# Patient Record
Sex: Female | Born: 1940
Health system: Southern US, Community
[De-identification: ages and names within clinical notes are randomized; demographics above are authoritative.]

## PROBLEM LIST (undated history)

## (undated) ENCOUNTER — Ambulatory Visit (HOSPITAL_COMMUNITY): Discharge status: Absent without leave | Payer: Self-pay

## (undated) DIAGNOSIS — M1990 Unspecified osteoarthritis, unspecified site: Secondary | ICD-10-CM

## (undated) DIAGNOSIS — R0600 Dyspnea, unspecified: Secondary | ICD-10-CM

## (undated) DIAGNOSIS — E7800 Pure hypercholesterolemia, unspecified: Secondary | ICD-10-CM

## (undated) DIAGNOSIS — C8590 Non-Hodgkin lymphoma, unspecified, unspecified site: Secondary | ICD-10-CM

## (undated) DIAGNOSIS — Z923 Personal history of irradiation: Secondary | ICD-10-CM

## (undated) DIAGNOSIS — M2550 Pain in unspecified joint: Secondary | ICD-10-CM

## (undated) DIAGNOSIS — R221 Localized swelling, mass and lump, neck: Secondary | ICD-10-CM

## (undated) DIAGNOSIS — D649 Anemia, unspecified: Secondary | ICD-10-CM

## (undated) DIAGNOSIS — G4700 Insomnia, unspecified: Secondary | ICD-10-CM

## (undated) HISTORY — DX: Pain in unspecified joint: M25.50

## (undated) HISTORY — DX: Insomnia, unspecified: G47.00

## (undated) HISTORY — PX: ABDOMINAL HYSTERECTOMY: SHX81

## (undated) HISTORY — DX: Pure hypercholesterolemia, unspecified: E78.00

---

## 1998-05-11 ENCOUNTER — Encounter: Payer: Self-pay | Admitting: Emergency Medicine

## 1998-05-11 ENCOUNTER — Emergency Department (HOSPITAL_COMMUNITY): Admission: EM | Admit: 1998-05-11 | Discharge: 1998-05-11 | Payer: Self-pay | Admitting: Emergency Medicine

## 2000-11-24 ENCOUNTER — Encounter: Payer: Self-pay | Admitting: Internal Medicine

## 2000-11-24 ENCOUNTER — Encounter: Admission: RE | Admit: 2000-11-24 | Discharge: 2000-11-24 | Payer: Self-pay | Admitting: Internal Medicine

## 2000-12-08 ENCOUNTER — Other Ambulatory Visit: Admission: RE | Admit: 2000-12-08 | Discharge: 2000-12-08 | Payer: Self-pay | Admitting: Gynecology

## 2001-04-29 ENCOUNTER — Encounter: Payer: Self-pay | Admitting: Internal Medicine

## 2001-04-29 ENCOUNTER — Emergency Department (HOSPITAL_COMMUNITY): Admission: EM | Admit: 2001-04-29 | Discharge: 2001-04-29 | Payer: Self-pay | Admitting: Emergency Medicine

## 2003-04-23 ENCOUNTER — Encounter: Admission: RE | Admit: 2003-04-23 | Discharge: 2003-07-22 | Payer: Self-pay | Admitting: Family Medicine

## 2003-10-31 ENCOUNTER — Other Ambulatory Visit: Admission: RE | Admit: 2003-10-31 | Discharge: 2003-10-31 | Payer: Self-pay | Admitting: Gynecology

## 2004-07-05 ENCOUNTER — Emergency Department (HOSPITAL_COMMUNITY): Admission: EM | Admit: 2004-07-05 | Discharge: 2004-07-05 | Payer: Self-pay | Admitting: Family Medicine

## 2004-11-05 ENCOUNTER — Encounter: Admission: RE | Admit: 2004-11-05 | Discharge: 2004-11-05 | Payer: Self-pay | Admitting: Family Medicine

## 2006-04-11 ENCOUNTER — Encounter: Admission: RE | Admit: 2006-04-11 | Discharge: 2006-04-11 | Payer: Self-pay | Admitting: Family Medicine

## 2006-10-06 ENCOUNTER — Emergency Department (HOSPITAL_COMMUNITY): Admission: EM | Admit: 2006-10-06 | Discharge: 2006-10-06 | Payer: Self-pay | Admitting: Family Medicine

## 2010-08-19 ENCOUNTER — Encounter
Admission: RE | Admit: 2010-08-19 | Discharge: 2010-08-19 | Payer: Self-pay | Source: Home / Self Care | Attending: Family Medicine | Admitting: Family Medicine

## 2011-03-17 ENCOUNTER — Other Ambulatory Visit (HOSPITAL_COMMUNITY): Payer: Self-pay | Admitting: Family Medicine

## 2011-03-17 DIAGNOSIS — Z1231 Encounter for screening mammogram for malignant neoplasm of breast: Secondary | ICD-10-CM

## 2011-03-23 ENCOUNTER — Ambulatory Visit (HOSPITAL_COMMUNITY)
Admission: RE | Admit: 2011-03-23 | Discharge: 2011-03-23 | Disposition: A | Discharge status: Home / Self Care | Payer: BC Managed Care – PPO | Source: Ambulatory Visit | Attending: Family Medicine | Admitting: Family Medicine

## 2011-03-23 DIAGNOSIS — Z1231 Encounter for screening mammogram for malignant neoplasm of breast: Secondary | ICD-10-CM | POA: Insufficient documentation

## 2012-03-17 ENCOUNTER — Other Ambulatory Visit (HOSPITAL_COMMUNITY): Payer: Self-pay | Admitting: Family Medicine

## 2012-03-17 DIAGNOSIS — Z1231 Encounter for screening mammogram for malignant neoplasm of breast: Secondary | ICD-10-CM

## 2012-04-05 ENCOUNTER — Ambulatory Visit (HOSPITAL_COMMUNITY)
Admission: RE | Admit: 2012-04-05 | Discharge: 2012-04-05 | Disposition: A | Discharge status: Home / Self Care | Payer: Medicare Other | Source: Ambulatory Visit | Attending: Family Medicine | Admitting: Family Medicine

## 2012-04-05 DIAGNOSIS — Z1231 Encounter for screening mammogram for malignant neoplasm of breast: Secondary | ICD-10-CM | POA: Insufficient documentation

## 2013-03-20 ENCOUNTER — Other Ambulatory Visit (HOSPITAL_COMMUNITY): Payer: Self-pay | Admitting: Family Medicine

## 2013-03-20 DIAGNOSIS — Z1231 Encounter for screening mammogram for malignant neoplasm of breast: Secondary | ICD-10-CM

## 2013-04-06 ENCOUNTER — Ambulatory Visit (HOSPITAL_COMMUNITY)
Admission: RE | Admit: 2013-04-06 | Discharge: 2013-04-06 | Disposition: A | Discharge status: Home / Self Care | Payer: Medicare Other | Source: Ambulatory Visit | Attending: Family Medicine | Admitting: Family Medicine

## 2013-04-06 DIAGNOSIS — Z1231 Encounter for screening mammogram for malignant neoplasm of breast: Secondary | ICD-10-CM | POA: Insufficient documentation

## 2013-07-26 ENCOUNTER — Emergency Department (INDEPENDENT_AMBULATORY_CARE_PROVIDER_SITE_OTHER): Payer: Medicare Other

## 2013-07-26 ENCOUNTER — Encounter (HOSPITAL_COMMUNITY): Payer: Self-pay | Admitting: Emergency Medicine

## 2013-07-26 ENCOUNTER — Emergency Department (HOSPITAL_COMMUNITY)
Admission: EM | Admit: 2013-07-26 | Discharge: 2013-07-26 | Disposition: A | Discharge status: Home / Self Care | Payer: Medicare Other | Source: Home / Self Care

## 2013-07-26 DIAGNOSIS — M25562 Pain in left knee: Secondary | ICD-10-CM

## 2013-07-26 DIAGNOSIS — M25569 Pain in unspecified knee: Secondary | ICD-10-CM

## 2013-07-26 MED ORDER — TRAMADOL HCL 50 MG PO TABS: 50.0000 mg | ORAL_TABLET | Freq: Four times a day (QID) | ORAL | Status: DC | PRN

## 2013-07-26 MED ORDER — HYDROCODONE-ACETAMINOPHEN 5-325 MG PO TABS: 1.0000 | ORAL_TABLET | Freq: Four times a day (QID) | ORAL | Status: DC | PRN

## 2013-07-26 NOTE — ED Provider Notes (Signed)
Sandra Payne is a 72 y.o. female who presents to Urgent Care today for knee pain. This occurred yesterday. Patient fell down 3 or 4 steps. She thinks she may have hit the medial portion of her knee on another stare. She has moderate to severe pain. The pain is worse with ambulation. She has tried Celebrex which has helped a bit. She denies any radiating pain weakness or numbness. She did not hit her head and does not have any headaches weakness or confusion.  Past medical history significant for osteoarthritis. She takes Celebrex daily for this.   History reviewed. No pertinent past medical history. History  Substance Use Topics  . Smoking status: Never Smoker   . Smokeless tobacco: Not on file  . Alcohol Use: No   ROS as above: No fevers or chills nausea vomiting diarrhea or chest pain Medications reviewed. No current facility-administered medications for this encounter.   Current Outpatient Prescriptions  Medication Sig Dispense Refill  . Celecoxib (CELEBREX PO) Take by mouth.      [name] Kitchen HYDROcodone-acetaminophen (NORCO/VICODIN) 5-325 MG per tablet Take 1 tablet by mouth every 6 (six) hours as needed.  10 tablet  0    Exam:  BP 124/61  Pulse 81  Temp(Src) 98.1 F (36.7 C) (Oral)  Resp 16  SpO2 99% Gen: Well NAD LEFT KNEE: Normal-appearing no effusions. To palpation medial femoral condyle. Range of motion 0-120 No significant retropatellar crepitations.  Capillary refill and sensation are intact distally. Antalgic gait  No results found for this or any previous visit (from the past 24 hour(s)). Dg Knee Complete 4 Views Left  07/26/2013   CLINICAL DATA:  Medial knee pain, fell last night  EXAM: LEFT KNEE - COMPLETE 4+ VIEW  COMPARISON:  None.  FINDINGS: Moderate narrowing of the medial compartment with mild osteophyte formation. Mild narrowing and osteophyte formation of the lateral compartment. No fracture or dislocation. No significant joint effusion. Mild narrowing and  osteophyte formation of the patellofemoral compartment.  IMPRESSION: Mild to moderate arthritis with no acute findings   Electronically Signed   By: Esperanza Heir M.D.   On: 07/26/2013 10:07    Assessment and Plan: 72 y.o. female with contusion versus medial meniscus injury. Plan to treat with continuation of her Celebrex, ice, relative rest. Additional prescribe Norco for more severe pain control. She should followup with her primary care sports medicine doctor at [facility] orthopedics.     Rodolph Bong, MD 07/26/13 [phone]

## 2013-07-26 NOTE — ED Notes (Signed)
Pt  Reports  She  Felled  Down      Steps  Last  Pm         She  Reports  Twisted her  l   Knee    Pt  Has  Pain  l  Knee      She  Is  Able  To  Ambulate  But has  Pain on  ROM

## 2013-11-02 ENCOUNTER — Other Ambulatory Visit: Payer: Self-pay | Admitting: Family Medicine

## 2013-11-02 ENCOUNTER — Ambulatory Visit
Admission: RE | Admit: 2013-11-02 | Discharge: 2013-11-02 | Disposition: A | Discharge status: Home / Self Care | Payer: Medicare Other | Source: Ambulatory Visit | Attending: Family Medicine | Admitting: Family Medicine

## 2013-11-02 DIAGNOSIS — R059 Cough, unspecified: Secondary | ICD-10-CM

## 2013-11-02 DIAGNOSIS — R05 Cough: Secondary | ICD-10-CM

## 2014-03-04 ENCOUNTER — Other Ambulatory Visit (HOSPITAL_COMMUNITY): Payer: Self-pay | Admitting: Family Medicine

## 2014-03-04 DIAGNOSIS — Z1231 Encounter for screening mammogram for malignant neoplasm of breast: Secondary | ICD-10-CM

## 2014-04-08 ENCOUNTER — Ambulatory Visit (HOSPITAL_COMMUNITY)
Admission: RE | Admit: 2014-04-08 | Discharge: 2014-04-08 | Disposition: A | Discharge status: Home / Self Care | Payer: Medicare Other | Source: Ambulatory Visit | Attending: Family Medicine | Admitting: Family Medicine

## 2014-04-08 ENCOUNTER — Other Ambulatory Visit (HOSPITAL_COMMUNITY): Payer: Self-pay | Admitting: Family Medicine

## 2014-04-08 DIAGNOSIS — Z1231 Encounter for screening mammogram for malignant neoplasm of breast: Secondary | ICD-10-CM

## 2014-04-10 ENCOUNTER — Other Ambulatory Visit: Payer: Self-pay | Admitting: Family Medicine

## 2014-04-10 DIAGNOSIS — R928 Other abnormal and inconclusive findings on diagnostic imaging of breast: Secondary | ICD-10-CM

## 2014-04-17 ENCOUNTER — Ambulatory Visit
Admission: RE | Admit: 2014-04-17 | Discharge: 2014-04-17 | Disposition: A | Discharge status: Home / Self Care | Payer: Medicare Other | Source: Ambulatory Visit | Attending: Family Medicine | Admitting: Family Medicine

## 2014-04-17 DIAGNOSIS — R928 Other abnormal and inconclusive findings on diagnostic imaging of breast: Secondary | ICD-10-CM

## 2014-12-03 ENCOUNTER — Encounter: Payer: Self-pay | Admitting: Neurology

## 2014-12-03 ENCOUNTER — Ambulatory Visit (INDEPENDENT_AMBULATORY_CARE_PROVIDER_SITE_OTHER): Payer: PPO | Admitting: Neurology

## 2014-12-03 VITALS — BP 110/70 | HR 99 | Ht <= 58 in | Wt 133.5 lb

## 2014-12-03 DIAGNOSIS — H8110 Benign paroxysmal vertigo, unspecified ear: Secondary | ICD-10-CM | POA: Diagnosis not present

## 2014-12-03 DIAGNOSIS — R269 Unspecified abnormalities of gait and mobility: Secondary | ICD-10-CM

## 2014-12-03 DIAGNOSIS — R42 Dizziness and giddiness: Secondary | ICD-10-CM | POA: Diagnosis not present

## 2014-12-03 DIAGNOSIS — R202 Paresthesia of skin: Secondary | ICD-10-CM | POA: Diagnosis not present

## 2014-12-03 LAB — FOLATE: FOLATE: 15.3 ng/mL

## 2014-12-03 MED ORDER — MECLIZINE HCL 25 MG PO TABS: 25.0000 mg | ORAL_TABLET | Freq: Three times a day (TID) | ORAL | Status: DC | PRN

## 2014-12-03 NOTE — Progress Notes (Signed)
[facility] Clinic Note - Initial Visit   Date: 12/03/2014   Sandra Payne MRN: [id number] DOB: 01-28-1941   Dear Dr. Stephanie Acre:  Thank you for your kind referral of Sandra Payne for consultation of left sided paresthesias and gait imbalance. Although her history is well known to you, please allow [place] to reiterate it for the purpose of our medical record. The patient was accompanied to the clinic by self.    History of Present Illness: Sandra Payne is a 74 y.o. right-handed African American female with hyperlipidemia, insomnia, and arthralgias presenting for evaluation of left hand and great toe paresthesias and gait instability.    She complains of imbalance for several years.  She is walking independently and has not had any falls.  She feels as if she veres to one side, but also reports having knee pain which limits her walking.  She is planning on having total knee arthroscopy in May.    She also complains of spells of vertigo which occurred several weeks ago and again yesterday.  It first started in 1980s and did not have it in many years until 6 weeks ago.  She has room-spinning sensation which lasts a few minutes.  Meclizine seems to help, but she cannot find her prescription.  She had intermittent left hand numbness and tingling, worse when she is laying on her left side.  No associated weakness or neck pain.  She also has left great toe numbness and cramps which is intermittent.  She endorses low back pain especially with prolonged sitting.     Out-side paper records, electronic medical record, and images have been reviewed where available and summarized as:  MRI lumbar spine without contrast 09/18/2013: 1. 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 This may contribute to right L5 root encroachment. 2. Mild multifactorial  spinal stenosis at L3-4 with mild narrowing of the lateral recesses and foramina bilaterally. 3. Mild by foraminal stenosis at L5-S1. 4. Mild spinal stenosis at T11-12.  XR cervical spine 08/31/2012: Extensive spondylosis and osteoarthritis. No fracture or spondylolisthesis.  Labs 05/28/2013 ferritin 20.9 Labs 05/06/2014: Vitamin D 25, TSH 0.54  Past Medical History  Diagnosis Date  . Elevated cholesterol   . Arthralgia   . Insomnia     Past Surgical History  Procedure Laterality Date  . Abdominal hysterectomy       Medications:  Current Outpatient Prescriptions on File Prior to Visit  Medication Sig Dispense Refill  . Celecoxib (CELEBREX PO) Take by mouth.     No current facility-administered medications on file prior to visit.    Allergies:  Allergies  Allergen Reactions  . Codeine Itching    Family History: Family History  Problem Relation Age of Onset  . Diabetes Mother   . Hypertension Mother     Social History: History   Social History  . Marital Status: Married    Spouse Name: N/A  . Number of Children: N/A  . Years of Education: N/A   Occupational History  . Not on file.   Social History Main Topics  . Smoking status: Never Smoker   . Smokeless tobacco: Not on file  . Alcohol Use: No  . Drug Use: No  . Sexual Activity: Not on file   Other Topics Concern  . Not on file   Social History Narrative   Lives alone in a 2 story home.  Husband passed away on August 22, 2014.     Works one day a week at [employer].     Retired from [employer].  Exercises regularly.     Review of Systems:  CONSTITUTIONAL: No fevers, chills, night sweats, or weight loss.   EYES: No visual changes or eye pain ENT: No hearing changes.  No history of nose bleeds.   RESPIRATORY: No cough, wheezing and shortness of breath.   CARDIOVASCULAR: Negative for chest pain, and palpitations.   GI: Negative for abdominal discomfort, blood in  stools or black stools.  No recent change in bowel habits.   GU:  No history of incontinence.   MUSCLOSKELETAL: +history of joint pain or swelling.  No myalgias.   SKIN: Negative for lesions, rash, and itching.   HEMATOLOGY/ONCOLOGY: Negative for prolonged bleeding, bruising easily, and swollen nodes.  No history of cancer.   ENDOCRINE: Negative for cold or heat intolerance, polydipsia or goiter.   PSYCH:  No depression or anxiety symptoms.   NEURO: As Above.   Vital Signs:  BP 110/70 mmHg  Pulse 99  Ht 4\' 10"  (1.473 m)  Wt 133 lb 8 oz (60.555 kg)  BMI 27.91 kg/m2  SpO2 99%   General Medical Exam:   General:  Well appearing, comfortable.   Eyes/ENT: see cranial nerve examination.   Neck: No masses appreciated.  Full range of motion without tenderness.  No carotid bruits. Respiratory:  Clear to auscultation, good air entry bilaterally.   Cardiac:  Regular rate and rhythm, no murmur.   Extremities:  No deformities, edema, or skin discoloration.  Skin:  No rashes or lesions.  Neurological Exam: MENTAL STATUS including orientation to time, place, person, recent and remote memory, attention span and concentration, language, and fund of knowledge is normal.  Speech is not dysarthric.  CRANIAL NERVES: II:  No visual field defects.  Unremarkable fundi.   III-IV-VI: Pupils equal round and reactive to light.  Normal conjugate, extra-ocular eye movements in all directions of gaze.  No nystagmus.  No ptosis.   V:  Normal facial sensation.   VII:  Normal facial symmetry and movements.  No pathologic facial reflexes.  VIII:  Normal hearing and vestibular function.   IX-X:  Normal palatal movement.   XI:  Normal shoulder shrug and head rotation.   XII:  Normal tongue strength and range of motion, no deviation or fasciculation.  MOTOR:  No atrophy, fasciculations or abnormal movements.  No pronator drift.  Tone is normal.    Right Upper Extremity:    Left Upper Extremity:    Deltoid  5/5    Deltoid  5/5   Biceps  5/5   Biceps  5/5   Triceps  5/5   Triceps  5/5   Wrist extensors  5/5   Wrist extensors  5/5   Wrist flexors  5/5   Wrist flexors  5/5   Finger extensors  5/5   Finger extensors  5/5   Finger flexors  5/5   Finger flexors  5/5   Dorsal interossei  5/5   Dorsal interossei  5/5   Abductor pollicis  5/5   Abductor pollicis  5/5   Tone (Ashworth scale)  0  Tone (Ashworth scale)  0   Right Lower Extremity:    Left Lower Extremity:    Hip flexors  5/5   Hip flexors  5/5   Hip extensors  5/5   Hip extensors  5/5  Knee flexors  5/5   Knee flexors  5/5   Knee extensors  5/5   Knee extensors  5/5   Dorsiflexors  5/5   Dorsiflexors  5/5   Plantarflexors  5/5   Plantarflexors  5/5   Toe extensors  5/5   Toe extensors  5/5   Toe flexors  5/5   Toe flexors  5/5   Tone (Ashworth scale)  0  Tone (Ashworth scale)  0   MSRs:  Right                                                                 Left brachioradialis 2+  brachioradialis 2+  biceps 2+  biceps 2+  triceps 2+  triceps 2+  patellar 2+  patellar 2+  ankle jerk 2+  ankle jerk 2+  Hoffman no  Hoffman no  plantar response down  plantar response down   SENSORY:  Normal and symmetric perception of light touch, pinprick, vibration, and proprioception.  Romberg's sign absent.   COORDINATION/GAIT: Normal finger-to- nose-finger and heel-to-shin.  Intact rapid alternating movements bilaterally.  Able to rise from a chair without using arms.  Gait is antalgic due to bilateral knee pain.   IMPRESSION: Ms. Elrod is a 74 year-old female presenting for evaluation of left hand and left great toe paresthesias as well as gait imbalance.  Her neurological exam is entirely normal and non-focal.  I offered NCS/EMG of the left arm and leg to better characterize the nature of her symptoms, but patient is not interested in testing at this time.  She can try to use a wrist splint as she may have mild carpal tunnel syndrome, especially  with morning hand paresthesias.  No diagnostic evidence for neuropathy on her exam to explain toe numbness, ?possibility of Morton's neuroma.  At this time, symptoms are not problematic enough to do additional testing.  Her gait problems is most likely stemming from knee pain.  If she continues to have balance problem even after her knee surgery, we may need to reinvestigate symptoms.  Dizzy spells are most consistent with BPPV, however, for completeness will check for carotid stenosis.    PLAN/RECOMMENDATIONS:  1.  Check vitamin B12, folate, copper for paresthesias 2.  US carotids  3.  OK to use meclizine 25mg  as needed for spells of vertigo 4.  Start wearing a wrist splint to the left hand  5.  Return to clinic as needed  The duration of this appointment visit was 40 minutes of face-to-face time with the patient.  Greater than 50% of this time was spent in counseling, explanation of diagnosis, planning of further management, and coordination of care.   Thank you for allowing me to participate in patient's care.  If I can answer any additional questions, I would be pleased to do so.    Sincerely,    Nakhi Choi K. Posey Pronto, DO

## 2014-12-03 NOTE — Patient Instructions (Addendum)
1.  US carotids- [employer] 15 minutes prior for check in 2.  Check blood work 3.  OK to use meclizine 25mg  as needed for spells of vertigo 4.  Start wearing a wrist splint to the left hand  5.  Return to clinic as needed

## 2014-12-03 NOTE — Progress Notes (Signed)
Note sent

## 2014-12-05 LAB — COPPER, SERUM: COPPER: 152 ug/dL (ref 70–175)

## 2014-12-06 ENCOUNTER — Ambulatory Visit (HOSPITAL_COMMUNITY)
Admission: RE | Admit: 2014-12-06 | Discharge: 2014-12-06 | Disposition: A | Discharge status: Home / Self Care | Payer: PPO | Source: Ambulatory Visit | Attending: Family Medicine | Admitting: Family Medicine

## 2014-12-06 DIAGNOSIS — R202 Paresthesia of skin: Secondary | ICD-10-CM | POA: Diagnosis not present

## 2014-12-06 DIAGNOSIS — R42 Dizziness and giddiness: Secondary | ICD-10-CM

## 2014-12-06 DIAGNOSIS — I6523 Occlusion and stenosis of bilateral carotid arteries: Secondary | ICD-10-CM | POA: Diagnosis not present

## 2014-12-06 DIAGNOSIS — H8110 Benign paroxysmal vertigo, unspecified ear: Secondary | ICD-10-CM | POA: Insufficient documentation

## 2014-12-06 DIAGNOSIS — R269 Unspecified abnormalities of gait and mobility: Secondary | ICD-10-CM

## 2014-12-06 NOTE — Progress Notes (Signed)
*  PRELIMINARY RESULTS* Vascular Ultrasound Carotid Duplex (Doppler) has been completed.  Preliminary findings: Bilateral:  1-39% ICA stenosis.  Vertebral artery flow is antegrade.      Landry Mellow, RDMS, RVT  12/06/2014, 11:05 AM

## 2014-12-09 ENCOUNTER — Other Ambulatory Visit: Payer: Self-pay | Admitting: Orthopedic Surgery

## 2014-12-10 ENCOUNTER — Other Ambulatory Visit: Payer: Self-pay | Admitting: Orthopedic Surgery

## 2014-12-23 ENCOUNTER — Other Ambulatory Visit (HOSPITAL_COMMUNITY): Payer: Self-pay | Admitting: *Deleted

## 2014-12-23 NOTE — Pre-Procedure Instructions (Addendum)
Sandra Payne  12/23/2014   Your procedure is scheduled on: Monday, May 16.  Report to [facility] Admitting at 8:00AM.  Call this number if you have problems the morning of surgery: [phone]   Remember:   Do not eat food or drink liquids after midnight Sunday, May 15.   Take these medicines the morning of surgery with A SIP OF WATER: -              Take if needed:meclizine (ANTIVERT), traMADol (ULTRAM).               On Monday, May 9, Stop taking celecoxib (CELEBREX).   Do not wear jewelry, make-up or nail polish.  Do not wear lotions, powders, or perfumes.  Do not shave 48 hours prior to surgery.   Do not bring valuables to the hospital.               [facility] is not responsible or any belongings or valuables.               Contacts, dentures or bridgework may not be worn into surgery.  Leave suitcase in the car. After surgery it may be brought to your room.  For patients admitted to the hospital, discharge time is determined by your  treatment team.              Special Instructions: Review  [facility] - Preparing For Surgery.   Please read over the following fact sheets that you were given: Pain Booklet, Coughing and Deep Breathing, Blood Transfusion Information and Surgical Site Infection Prevention and Incentive Spirometry.

## 2014-12-24 ENCOUNTER — Encounter (HOSPITAL_COMMUNITY): Payer: Self-pay

## 2014-12-24 ENCOUNTER — Encounter (HOSPITAL_COMMUNITY)
Admission: RE | Admit: 2014-12-24 | Discharge: 2014-12-24 | Disposition: A | Discharge status: Home / Self Care | Payer: PPO | Source: Ambulatory Visit | Attending: Orthopedic Surgery | Admitting: Orthopedic Surgery

## 2014-12-24 DIAGNOSIS — Z01810 Encounter for preprocedural cardiovascular examination: Secondary | ICD-10-CM | POA: Insufficient documentation

## 2014-12-24 DIAGNOSIS — E785 Hyperlipidemia, unspecified: Secondary | ICD-10-CM | POA: Diagnosis not present

## 2014-12-24 DIAGNOSIS — Z87891 Personal history of nicotine dependence: Secondary | ICD-10-CM | POA: Diagnosis not present

## 2014-12-24 DIAGNOSIS — Z01818 Encounter for other preprocedural examination: Secondary | ICD-10-CM | POA: Insufficient documentation

## 2014-12-24 DIAGNOSIS — Z01812 Encounter for preprocedural laboratory examination: Secondary | ICD-10-CM | POA: Insufficient documentation

## 2014-12-24 HISTORY — DX: Anemia, unspecified: D64.9

## 2014-12-24 HISTORY — DX: Unspecified osteoarthritis, unspecified site: M19.90

## 2014-12-24 LAB — CBC WITH DIFFERENTIAL/PLATELET
BASOS ABS: 0 10*3/uL (ref 0.0–0.1)
BASOS PCT: 0 % (ref 0–1)
EOS ABS: 0.4 10*3/uL (ref 0.0–0.7)
EOS PCT: 6 % — AB (ref 0–5)
HEMATOCRIT: 37.4 % (ref 36.0–46.0)
HEMOGLOBIN: 11.7 g/dL — AB (ref 12.0–15.0)
Lymphocytes Relative: 36 % (ref 12–46)
Lymphs Abs: 2.5 10*3/uL (ref 0.7–4.0)
MCH: 23.7 pg — ABNORMAL LOW (ref 26.0–34.0)
MCHC: 31.3 g/dL (ref 30.0–36.0)
MCV: 75.7 fL — AB (ref 78.0–100.0)
MONO ABS: 0.7 10*3/uL (ref 0.1–1.0)
Monocytes Relative: 9 % (ref 3–12)
Neutro Abs: 3.4 10*3/uL (ref 1.7–7.7)
Neutrophils Relative %: 49 % (ref 43–77)
Platelets: 556 10*3/uL — ABNORMAL HIGH (ref 150–400)
RBC: 4.94 MIL/uL (ref 3.87–5.11)
RDW: 16.1 % — AB (ref 11.5–15.5)
WBC: 7 10*3/uL (ref 4.0–10.5)

## 2014-12-24 LAB — TYPE AND SCREEN
ABO/RH(D): O NEG
Antibody Screen: NEGATIVE

## 2014-12-24 LAB — BASIC METABOLIC PANEL
Anion gap: 8 (ref 5–15)
BUN: 12 mg/dL (ref 6–20)
CALCIUM: 9.6 mg/dL (ref 8.9–10.3)
CO2: 30 mmol/L (ref 22–32)
Chloride: 105 mmol/L (ref 101–111)
Creatinine, Ser: 0.89 mg/dL (ref 0.44–1.00)
GFR calc Af Amer: >60 mL/min (ref >60)
GFR calc non Af Amer: >60 mL/min (ref >60)
GLUCOSE: 93 mg/dL (ref 70–99)
POTASSIUM: 3.9 mmol/L (ref 3.5–5.1)
Sodium: 143 mmol/L (ref 135–145)

## 2014-12-24 LAB — URINALYSIS, ROUTINE W REFLEX MICROSCOPIC
Bilirubin Urine: NEGATIVE
Glucose, UA: NEGATIVE mg/dL
Hgb urine dipstick: NEGATIVE
KETONES UR: NEGATIVE mg/dL
Nitrite: NEGATIVE
PROTEIN: NEGATIVE mg/dL
Specific Gravity, Urine: 1.018 (ref 1.005–1.030)
UROBILINOGEN UA: 0.2 mg/dL (ref 0.0–1.0)
pH: 5 (ref 5.0–8.0)

## 2014-12-24 LAB — PROTIME-INR
INR: 0.99 (ref 0.00–1.49)
PROTHROMBIN TIME: 13.2 s (ref 11.6–15.2)

## 2014-12-24 LAB — URINE MICROSCOPIC-ADD ON

## 2014-12-24 LAB — APTT: aPTT: 30 seconds (ref 24–37)

## 2014-12-24 LAB — SURGICAL PCR SCREEN
MRSA, PCR: NEGATIVE
Staphylococcus aureus: POSITIVE — AB

## 2014-12-24 LAB — ABO/RH: ABO/RH(D): O NEG

## 2014-12-24 NOTE — Progress Notes (Signed)
Mupirocin Ointment Rx called into [facility] on [address] for positive PCR of staph. Left message on pt's voicemail notifying her of positive staph.

## 2014-12-26 NOTE — Progress Notes (Addendum)
Anesthesia Chart Review:  Pt is 74 year old female scheduled for R total knee arthroplasty on 01/06/2015 with Dr. Mayer Camel.   PMH includes: hyperlipidemia, anemia. Former smoker. BMI 29  Preoperative labs reviewed.  Few bacteria and 7-10 WBC per hpf on urine microscopy. Notified Juliann Pulse in Dr. Damita Dunnings office.   Chest x-ray reviewed. Negative chest x-ray.   EKG: NSR. Possible LA enlargement.   Carotid doppler US 12/07/2014:  - Findings consistent with 1- 39 percent stenosis involving the right internal carotid artery and the left internal carotid artery. - ICA/CCA ratio. right = 0.98. left = 0.62.  If no changes, I anticipate pt can proceed with surgery as scheduled.   Willeen Cass, FNP-BC [facility] Stay [facility]/Anesthesiology Phone: [phone] 12/26/2014 1:41 PM

## 2015-01-03 NOTE — H&P (Signed)
TOTAL KNEE ADMISSION H&P  Patient is being admitted for right total knee arthroplasty.  Subjective:  Chief Complaint:right knee pain.  HPI: Sandra Payne, 74 y.o. female, has a history of pain and functional disability in the right knee due to arthritis and has failed non-surgical conservative treatments for greater than 12 weeks to includeNSAID's and/or analgesics, corticosteriod injections, viscosupplementation injections, use of assistive devices, weight reduction as appropriate and activity modification.  Onset of symptoms was gradual, starting 5 years ago with gradually worsening course since that time. The patient noted no past surgery on the right knee(s).  Patient currently rates pain in the right knee(s) at 10 out of 10 with activity. Patient has night pain, worsening of pain with activity and weight bearing, pain that interferes with activities of daily living, pain with passive range of motion and crepitus.  Patient has evidence of joint space narrowing by imaging studies. There is no active infection.  Patient Active Problem List   Diagnosis Date Noted  . Abnormality of gait 12/03/2014  . Benign paroxysmal positional vertigo 12/03/2014  . Paresthesias 12/03/2014   Past Medical History  Diagnosis Date  . Elevated cholesterol   . Arthralgia   . Insomnia   . Arthritis   . Anemia     Past Surgical History  Procedure Laterality Date  . Abdominal hysterectomy      partial    No prescriptions prior to admission   Allergies  Allergen Reactions  . Codeine Itching  . Sulfa Antibiotics Other (See Comments)    Childhood allergy    History  Substance Use Topics  . Smoking status: Former Smoker -- 0.25 packs/day for 2 years    Types: Cigarettes  . Smokeless tobacco: Not on file  . Alcohol Use: 0.0 oz/week    0 Standard drinks or equivalent per week     Comment: occasionally    Family History  Problem Relation Age of Onset  . Diabetes Mother   . Hypertension Mother       Review of Systems  Constitutional: Positive for diaphoresis.  HENT: Negative.   Eyes: Negative.   Respiratory: Negative.   Cardiovascular: Negative.   Gastrointestinal: Negative.   Genitourinary: Negative.   Musculoskeletal: Positive for joint pain.  Skin: Negative.   Neurological: Negative.   Endo/Heme/Allergies: Negative.   Psychiatric/Behavioral: The patient has insomnia.     Objective:  Physical Exam  Constitutional: She appears well-developed and well-nourished.  HENT:  Head: Normocephalic and atraumatic.  Eyes: Pupils are equal, round, and reactive to light.  Neck: Normal range of motion. Neck supple.  Cardiovascular: Intact distal pulses.   Respiratory: Effort normal.  Musculoskeletal: She exhibits tenderness.  Examination of her knees reveals there's no obvious erythema, warmth nor effusion.  She has good range of motion.  Tenderness along the medial compartments.  She is ambulatory.  No signs of vascular compromise.  Skin: Skin is warm and dry.  Psychiatric: She has a normal mood and affect. Her behavior is normal. Judgment and thought content normal.    Vital signs in last 24 hours:    Labs:   Estimated body mass index is 27.91 kg/(m^2) as calculated from the following:   Height as of 12/03/14: 4\' 10"  (1.473 m).   Weight as of 12/03/14: 60.555 kg (133 lb 8 oz).   Imaging Review Plain radiographs demonstrate severe degenerative joint disease of the bilaterally knee(s).  Assessment/Plan:  End stage arthritis, right knee   The patient history, physical examination, clinical judgment  of the provider and imaging studies are consistent with end stage degenerative joint disease of the right knee(s) and total knee arthroplasty is deemed medically necessary. The treatment options including medical management, injection therapy arthroscopy and arthroplasty were discussed at length. The risks and benefits of total knee arthroplasty were presented and reviewed. The  risks due to aseptic loosening, infection, stiffness, patella tracking problems, thromboembolic complications and other imponderables were discussed. The patient acknowledged the explanation, agreed to proceed with the plan and consent was signed. Patient is being admitted for inpatient treatment for surgery, pain control, PT, OT, prophylactic antibiotics, VTE prophylaxis, progressive ambulation and ADL's and discharge planning. The patient is planning to be discharged home with home health services

## 2015-01-05 DIAGNOSIS — M1711 Unilateral primary osteoarthritis, right knee: Secondary | ICD-10-CM | POA: Diagnosis present

## 2015-01-06 ENCOUNTER — Inpatient Hospital Stay (HOSPITAL_COMMUNITY)
Admission: RE | Admit: 2015-01-06 | Discharge: 2015-01-08 | DRG: 470 | Disposition: A | Discharge status: Skilled Nursing Facility | Payer: PPO | Source: Ambulatory Visit | Attending: Orthopedic Surgery | Admitting: Orthopedic Surgery

## 2015-01-06 ENCOUNTER — Encounter (HOSPITAL_COMMUNITY)
Admission: RE | Disposition: A | Discharge status: Skilled Nursing Facility | Payer: Self-pay | Source: Ambulatory Visit | Attending: Orthopedic Surgery

## 2015-01-06 ENCOUNTER — Inpatient Hospital Stay (HOSPITAL_COMMUNITY): Payer: PPO | Admitting: Anesthesiology

## 2015-01-06 ENCOUNTER — Encounter (HOSPITAL_COMMUNITY): Payer: Self-pay | Admitting: Surgery

## 2015-01-06 ENCOUNTER — Inpatient Hospital Stay (HOSPITAL_COMMUNITY): Payer: PPO | Admitting: Emergency Medicine

## 2015-01-06 DIAGNOSIS — Z7982 Long term (current) use of aspirin: Secondary | ICD-10-CM

## 2015-01-06 DIAGNOSIS — Z885 Allergy status to narcotic agent status: Secondary | ICD-10-CM | POA: Diagnosis not present

## 2015-01-06 DIAGNOSIS — Z833 Family history of diabetes mellitus: Secondary | ICD-10-CM | POA: Diagnosis not present

## 2015-01-06 DIAGNOSIS — Z882 Allergy status to sulfonamides status: Secondary | ICD-10-CM

## 2015-01-06 DIAGNOSIS — Z8249 Family history of ischemic heart disease and other diseases of the circulatory system: Secondary | ICD-10-CM | POA: Diagnosis not present

## 2015-01-06 DIAGNOSIS — M25561 Pain in right knee: Secondary | ICD-10-CM | POA: Diagnosis present

## 2015-01-06 DIAGNOSIS — E780 Pure hypercholesterolemia: Secondary | ICD-10-CM | POA: Diagnosis present

## 2015-01-06 DIAGNOSIS — Z87891 Personal history of nicotine dependence: Secondary | ICD-10-CM | POA: Diagnosis not present

## 2015-01-06 DIAGNOSIS — D62 Acute posthemorrhagic anemia: Secondary | ICD-10-CM | POA: Diagnosis not present

## 2015-01-06 DIAGNOSIS — G4700 Insomnia, unspecified: Secondary | ICD-10-CM | POA: Diagnosis present

## 2015-01-06 DIAGNOSIS — Z79899 Other long term (current) drug therapy: Secondary | ICD-10-CM

## 2015-01-06 DIAGNOSIS — H8110 Benign paroxysmal vertigo, unspecified ear: Secondary | ICD-10-CM | POA: Diagnosis present

## 2015-01-06 DIAGNOSIS — M1711 Unilateral primary osteoarthritis, right knee: Secondary | ICD-10-CM | POA: Diagnosis present

## 2015-01-06 DIAGNOSIS — M1710 Unilateral primary osteoarthritis, unspecified knee: Secondary | ICD-10-CM | POA: Diagnosis present

## 2015-01-06 HISTORY — PX: TOTAL KNEE ARTHROPLASTY: SHX125

## 2015-01-06 SURGERY — ARTHROPLASTY, KNEE, TOTAL
Anesthesia: Spinal | Site: Knee | Laterality: Right

## 2015-01-06 MED ORDER — SODIUM CHLORIDE 0.9 % IR SOLN
Status: DC | PRN
  Administered 2015-01-06 (×2): 1000 mL

## 2015-01-06 MED ORDER — 0.9 % SODIUM CHLORIDE (POUR BTL) OPTIME
TOPICAL | Status: DC | PRN
  Administered 2015-01-06: 1000 mL

## 2015-01-06 MED ORDER — ASPIRIN EC 325 MG PO TBEC
325.0000 mg | DELAYED_RELEASE_TABLET | Freq: Every day | ORAL | Status: DC
  Administered 2015-01-07 – 2015-01-08 (×2): 325 mg via ORAL
  Filled 2015-01-06 (×2): qty 1

## 2015-01-06 MED ORDER — MENTHOL 3 MG MT LOZG
1.0000 | LOZENGE | OROMUCOSAL | Status: DC | PRN
  Filled 2015-01-06: qty 9

## 2015-01-06 MED ORDER — LIDOCAINE HCL (CARDIAC) 20 MG/ML IV SOLN
INTRAVENOUS | Status: AC
  Filled 2015-01-06: qty 5

## 2015-01-06 MED ORDER — CHLORHEXIDINE GLUCONATE 4 % EX LIQD: 60.0000 mL | Freq: Once | CUTANEOUS | Status: DC

## 2015-01-06 MED ORDER — PROPOFOL 10 MG/ML IV BOLUS
INTRAVENOUS | Status: AC
  Filled 2015-01-06: qty 20

## 2015-01-06 MED ORDER — ACETAMINOPHEN 650 MG RE SUPP: 650.0000 mg | Freq: Four times a day (QID) | RECTAL | Status: DC | PRN

## 2015-01-06 MED ORDER — LACTATED RINGERS IV SOLN
INTRAVENOUS | Status: DC | PRN
  Administered 2015-01-06 (×2): via INTRAVENOUS

## 2015-01-06 MED ORDER — SODIUM CHLORIDE 0.9 % IJ SOLN
INTRAMUSCULAR | Status: DC | PRN
  Administered 2015-01-06: 40 mL

## 2015-01-06 MED ORDER — PROPOFOL INFUSION 10 MG/ML OPTIME
INTRAVENOUS | Status: DC | PRN
  Administered 2015-01-06: 100 ug/kg/min via INTRAVENOUS

## 2015-01-06 MED ORDER — BUPIVACAINE IN DEXTROSE 0.75-8.25 % IT SOLN
INTRATHECAL | Status: DC | PRN
  Administered 2015-01-06: 12 mg via INTRATHECAL

## 2015-01-06 MED ORDER — PHENYLEPHRINE HCL 10 MG/ML IJ SOLN
INTRAMUSCULAR | Status: DC | PRN
  Administered 2015-01-06 (×3): 40 ug via INTRAVENOUS

## 2015-01-06 MED ORDER — METHOCARBAMOL 500 MG PO TABS
500.0000 mg | ORAL_TABLET | Freq: Four times a day (QID) | ORAL | Status: DC | PRN
  Administered 2015-01-06 – 2015-01-08 (×4): 500 mg via ORAL
  Filled 2015-01-06 (×4): qty 1

## 2015-01-06 MED ORDER — HYDROMORPHONE HCL 1 MG/ML IJ SOLN
0.5000 mg | INTRAMUSCULAR | Status: DC | PRN
  Administered 2015-01-06 (×2): 1 mg via INTRAVENOUS
  Filled 2015-01-06 (×2): qty 1

## 2015-01-06 MED ORDER — HYDROMORPHONE HCL 1 MG/ML IJ SOLN
0.2500 mg | INTRAMUSCULAR | Status: DC | PRN
  Administered 2015-01-06: 0.25 mg via INTRAVENOUS
  Administered 2015-01-06 (×2): 0.5 mg via INTRAVENOUS
  Administered 2015-01-06: 0.25 mg via INTRAVENOUS
  Administered 2015-01-06: 0.5 mg via INTRAVENOUS

## 2015-01-06 MED ORDER — PHENOL 1.4 % MT LIQD: 1.0000 | OROMUCOSAL | Status: DC | PRN

## 2015-01-06 MED ORDER — ALUM & MAG HYDROXIDE-SIMETH 200-200-20 MG/5ML PO SUSP
30.0000 mL | ORAL | Status: DC | PRN
  Administered 2015-01-06 – 2015-01-07 (×2): 30 mL via ORAL
  Filled 2015-01-06 (×2): qty 30

## 2015-01-06 MED ORDER — PHENYLEPHRINE 40 MCG/ML (10ML) SYRINGE FOR IV PUSH (FOR BLOOD PRESSURE SUPPORT)
PREFILLED_SYRINGE | INTRAVENOUS | Status: AC
  Filled 2015-01-06: qty 10

## 2015-01-06 MED ORDER — ACETAMINOPHEN 325 MG PO TABS
ORAL_TABLET | ORAL | Status: AC
  Administered 2015-01-06: 650 mg via ORAL
  Filled 2015-01-06: qty 2

## 2015-01-06 MED ORDER — LACTATED RINGERS IV SOLN: INTRAVENOUS | Status: DC

## 2015-01-06 MED ORDER — ONDANSETRON HCL 4 MG/2ML IJ SOLN
4.0000 mg | Freq: Four times a day (QID) | INTRAMUSCULAR | Status: DC | PRN
  Administered 2015-01-06: 4 mg via INTRAVENOUS
  Filled 2015-01-06: qty 2

## 2015-01-06 MED ORDER — CEFAZOLIN SODIUM-DEXTROSE 2-3 GM-% IV SOLR
INTRAVENOUS | Status: AC
  Filled 2015-01-06: qty 50

## 2015-01-06 MED ORDER — BISACODYL 5 MG PO TBEC: 5.0000 mg | DELAYED_RELEASE_TABLET | Freq: Every day | ORAL | Status: DC | PRN

## 2015-01-06 MED ORDER — MIDAZOLAM HCL 2 MG/2ML IJ SOLN
INTRAMUSCULAR | Status: AC
  Filled 2015-01-06: qty 2

## 2015-01-06 MED ORDER — TIZANIDINE HCL 2 MG PO CAPS: 2.0000 mg | ORAL_CAPSULE | Freq: Three times a day (TID) | ORAL | Status: DC

## 2015-01-06 MED ORDER — CELECOXIB 200 MG PO CAPS
200.0000 mg | ORAL_CAPSULE | Freq: Two times a day (BID) | ORAL | Status: DC
  Administered 2015-01-06 – 2015-01-08 (×4): 200 mg via ORAL
  Filled 2015-01-06 (×4): qty 1

## 2015-01-06 MED ORDER — MEPERIDINE HCL 25 MG/ML IJ SOLN: 6.2500 mg | INTRAMUSCULAR | Status: DC | PRN

## 2015-01-06 MED ORDER — MIDAZOLAM HCL 2 MG/2ML IJ SOLN: 0.5000 mg | Freq: Once | INTRAMUSCULAR | Status: DC | PRN

## 2015-01-06 MED ORDER — OXYCODONE-ACETAMINOPHEN 5-325 MG PO TABS: 1.0000 | ORAL_TABLET | ORAL | Status: DC | PRN

## 2015-01-06 MED ORDER — OXYCODONE HCL 5 MG PO TABS
5.0000 mg | ORAL_TABLET | ORAL | Status: DC | PRN
  Administered 2015-01-06 – 2015-01-08 (×11): 10 mg via ORAL
  Filled 2015-01-06 (×11): qty 2

## 2015-01-06 MED ORDER — METOCLOPRAMIDE HCL 5 MG PO TABS: 5.0000 mg | ORAL_TABLET | Freq: Three times a day (TID) | ORAL | Status: DC | PRN

## 2015-01-06 MED ORDER — CEFUROXIME SODIUM 1.5 G IJ SOLR
INTRAMUSCULAR | Status: AC
  Filled 2015-01-06: qty 1.5

## 2015-01-06 MED ORDER — FENTANYL CITRATE (PF) 100 MCG/2ML IJ SOLN
INTRAMUSCULAR | Status: DC | PRN
  Administered 2015-01-06 (×2): 50 ug via INTRAVENOUS

## 2015-01-06 MED ORDER — METHOCARBAMOL 1000 MG/10ML IJ SOLN
500.0000 mg | INTRAVENOUS | Status: AC
  Administered 2015-01-06: 500 mg via INTRAVENOUS
  Filled 2015-01-06: qty 5

## 2015-01-06 MED ORDER — FLEET ENEMA 7-19 GM/118ML RE ENEM: 1.0000 | ENEMA | Freq: Once | RECTAL | Status: AC | PRN

## 2015-01-06 MED ORDER — FENTANYL CITRATE (PF) 250 MCG/5ML IJ SOLN
INTRAMUSCULAR | Status: AC
  Filled 2015-01-06: qty 5

## 2015-01-06 MED ORDER — SENNOSIDES-DOCUSATE SODIUM 8.6-50 MG PO TABS
1.0000 | ORAL_TABLET | Freq: Every evening | ORAL | Status: DC | PRN
Start: 2015-01-06 — End: 2015-01-08

## 2015-01-06 MED ORDER — ONDANSETRON HCL 4 MG PO TABS: 4.0000 mg | ORAL_TABLET | Freq: Four times a day (QID) | ORAL | Status: DC | PRN

## 2015-01-06 MED ORDER — FENTANYL CITRATE (PF) 100 MCG/2ML IJ SOLN
INTRAMUSCULAR | Status: AC
  Filled 2015-01-06: qty 2

## 2015-01-06 MED ORDER — PROPOFOL 10 MG/ML IV BOLUS
INTRAVENOUS | Status: DC | PRN
  Administered 2015-01-06: 20 mg via INTRAVENOUS

## 2015-01-06 MED ORDER — OXYCODONE HCL 5 MG PO TABS
ORAL_TABLET | ORAL | Status: AC
Start: 2015-01-06 — End: 2015-01-06
  Administered 2015-01-06: 10 mg via ORAL
  Filled 2015-01-06: qty 2

## 2015-01-06 MED ORDER — METHOCARBAMOL 1000 MG/10ML IJ SOLN
500.0000 mg | Freq: Four times a day (QID) | INTRAVENOUS | Status: DC | PRN
  Filled 2015-01-06: qty 5

## 2015-01-06 MED ORDER — ASPIRIN EC 325 MG PO TBEC: 325.0000 mg | DELAYED_RELEASE_TABLET | Freq: Two times a day (BID) | ORAL | Status: DC

## 2015-01-06 MED ORDER — DIPHENHYDRAMINE HCL 12.5 MG/5ML PO ELIX: 12.5000 mg | ORAL_SOLUTION | ORAL | Status: DC | PRN

## 2015-01-06 MED ORDER — METOCLOPRAMIDE HCL 5 MG/ML IJ SOLN: 5.0000 mg | Freq: Three times a day (TID) | INTRAMUSCULAR | Status: DC | PRN

## 2015-01-06 MED ORDER — MECLIZINE HCL 25 MG PO TABS
25.0000 mg | ORAL_TABLET | Freq: Three times a day (TID) | ORAL | Status: DC | PRN
  Filled 2015-01-06: qty 1

## 2015-01-06 MED ORDER — HYDROMORPHONE HCL 1 MG/ML IJ SOLN
INTRAMUSCULAR | Status: AC
  Administered 2015-01-06: 0.25 mg via INTRAVENOUS
  Filled 2015-01-06: qty 1

## 2015-01-06 MED ORDER — ACETAMINOPHEN 325 MG PO TABS
650.0000 mg | ORAL_TABLET | Freq: Four times a day (QID) | ORAL | Status: DC | PRN
  Administered 2015-01-06: 650 mg via ORAL
  Filled 2015-01-06: qty 2

## 2015-01-06 MED ORDER — CEFAZOLIN SODIUM-DEXTROSE 2-3 GM-% IV SOLR
2.0000 g | INTRAVENOUS | Status: AC
  Administered 2015-01-06: 2 g via INTRAVENOUS

## 2015-01-06 MED ORDER — PROMETHAZINE HCL 25 MG/ML IJ SOLN: 6.2500 mg | INTRAMUSCULAR | Status: DC | PRN

## 2015-01-06 MED ORDER — CEFUROXIME SODIUM 1.5 G IJ SOLR
INTRAMUSCULAR | Status: DC | PRN
  Administered 2015-01-06: 1.5 g

## 2015-01-06 MED ORDER — KCL IN DEXTROSE-NACL 20-5-0.45 MEQ/L-%-% IV SOLN
INTRAVENOUS | Status: DC
  Administered 2015-01-06 – 2015-01-07 (×2): via INTRAVENOUS
  Filled 2015-01-06 (×8): qty 1000

## 2015-01-06 MED ORDER — LIDOCAINE HCL (CARDIAC) 20 MG/ML IV SOLN
INTRAVENOUS | Status: DC | PRN
  Administered 2015-01-06: 20 mg via INTRAVENOUS

## 2015-01-06 MED ORDER — ONDANSETRON HCL 4 MG/2ML IJ SOLN
INTRAMUSCULAR | Status: DC | PRN
  Administered 2015-01-06: 4 mg via INTRAVENOUS

## 2015-01-06 MED ORDER — SODIUM CHLORIDE 0.9 % IV SOLN
1000.0000 mg | INTRAVENOUS | Status: AC
  Administered 2015-01-06: 1000 mg via INTRAVENOUS
  Filled 2015-01-06: qty 10

## 2015-01-06 MED ORDER — HYDROMORPHONE HCL 1 MG/ML IJ SOLN
INTRAMUSCULAR | Status: AC
Start: 2015-01-06 — End: 2015-01-06
  Administered 2015-01-06: 0.5 mg via INTRAVENOUS
  Filled 2015-01-06: qty 1

## 2015-01-06 MED ORDER — PHENYLEPHRINE HCL 10 MG/ML IJ SOLN
10.0000 mg | INTRAVENOUS | Status: DC | PRN
  Administered 2015-01-06: 20 ug/min via INTRAVENOUS

## 2015-01-06 MED ORDER — DOCUSATE SODIUM 100 MG PO CAPS
100.0000 mg | ORAL_CAPSULE | Freq: Two times a day (BID) | ORAL | Status: DC
  Administered 2015-01-06 – 2015-01-08 (×4): 100 mg via ORAL
  Filled 2015-01-06 (×4): qty 1

## 2015-01-06 MED ORDER — MIDAZOLAM HCL 5 MG/5ML IJ SOLN
INTRAMUSCULAR | Status: DC | PRN
  Administered 2015-01-06: 2 mg via INTRAVENOUS

## 2015-01-06 MED ORDER — BUPIVACAINE LIPOSOME 1.3 % IJ SUSP
20.0000 mL | Freq: Once | INTRAMUSCULAR | Status: AC
  Administered 2015-01-06: 20 mL
  Filled 2015-01-06: qty 20

## 2015-01-06 MED ORDER — DEXTROSE-NACL 5-0.45 % IV SOLN: INTRAVENOUS | Status: DC

## 2015-01-06 MED ORDER — ONDANSETRON HCL 4 MG/2ML IJ SOLN
INTRAMUSCULAR | Status: AC
  Filled 2015-01-06: qty 2

## 2015-01-06 SURGICAL SUPPLY — 67 items
BANDAGE ELASTIC 6 VELCRO ST LF (GAUZE/BANDAGES/DRESSINGS) ×1 IMPLANT
BANDAGE ESMARK 6X9 LF (GAUZE/BANDAGES/DRESSINGS) ×1 IMPLANT
BLADE SAG 18X100X1.27 (BLADE) ×2 IMPLANT
BLADE SAW SGTL 13X75X1.27 (BLADE) ×2 IMPLANT
BLADE SURG ROTATE 9660 (MISCELLANEOUS) IMPLANT
BNDG CMPR 9X6 STRL LF SNTH (GAUZE/BANDAGES/DRESSINGS) ×1
BNDG CMPR MED 10X6 ELC LF (GAUZE/BANDAGES/DRESSINGS)
BNDG ELASTIC 6X10 VLCR STRL LF (GAUZE/BANDAGES/DRESSINGS) ×1 IMPLANT
BNDG ESMARK 6X9 LF (GAUZE/BANDAGES/DRESSINGS) ×2
BOWL SMART MIX CTS (DISPOSABLE) ×2 IMPLANT
CAPT KNEE TOTAL 3 ATTUNE ×1 IMPLANT
CEMENT HV SMART SET (Cement) ×6 IMPLANT
COVER SURGICAL LIGHT HANDLE (MISCELLANEOUS) ×2 IMPLANT
CUFF TOURNIQUET SINGLE 34IN LL (TOURNIQUET CUFF) ×1 IMPLANT
CUFF TOURNIQUET SINGLE 44IN (TOURNIQUET CUFF) IMPLANT
DRAPE EXTREMITY T 121X128X90 (DRAPE) ×2 IMPLANT
DRAPE IMP U-DRAPE 54X76 (DRAPES) ×2 IMPLANT
DRAPE U-SHAPE 47X51 STRL (DRAPES) ×2 IMPLANT
DURAPREP 26ML APPLICATOR (WOUND CARE) ×4 IMPLANT
ELECT REM PT RETURN 9FT ADLT (ELECTROSURGICAL) ×2
ELECTRODE REM PT RTRN 9FT ADLT (ELECTROSURGICAL) ×1 IMPLANT
EVACUATOR 1/8 PVC DRAIN (DRAIN) IMPLANT
GAUZE SPONGE 4X4 12PLY STRL (GAUZE/BANDAGES/DRESSINGS) ×3 IMPLANT
GAUZE XEROFORM 1X8 LF (GAUZE/BANDAGES/DRESSINGS) ×2 IMPLANT
GLOVE BIO SURGEON STRL SZ7.5 (GLOVE) ×2 IMPLANT
GLOVE BIO SURGEON STRL SZ8.5 (GLOVE) ×2 IMPLANT
GLOVE BIOGEL PI IND STRL 8 (GLOVE) ×1 IMPLANT
GLOVE BIOGEL PI IND STRL 9 (GLOVE) ×1 IMPLANT
GLOVE BIOGEL PI INDICATOR 8 (GLOVE) ×1
GLOVE BIOGEL PI INDICATOR 9 (GLOVE) ×1
GOWN STRL REUS W/ TWL LRG LVL3 (GOWN DISPOSABLE) ×1 IMPLANT
GOWN STRL REUS W/ TWL XL LVL3 (GOWN DISPOSABLE) ×2 IMPLANT
GOWN STRL REUS W/TWL LRG LVL3 (GOWN DISPOSABLE) ×2
GOWN STRL REUS W/TWL XL LVL3 (GOWN DISPOSABLE) ×4
HANDPIECE INTERPULSE COAX TIP (DISPOSABLE) ×2
HOOD PEEL AWAY FACE SHEILD DIS (HOOD) ×4 IMPLANT
KIT BASIN OR (CUSTOM PROCEDURE TRAY) ×2 IMPLANT
KIT ROOM TURNOVER OR (KITS) ×2 IMPLANT
MANIFOLD NEPTUNE II (INSTRUMENTS) ×2 IMPLANT
NDL SAFETY ECLIPSE 18X1.5 (NEEDLE) IMPLANT
NDL SPNL 18GX3.5 QUINCKE PK (NEEDLE) IMPLANT
NEEDLE 22X1 1/2 (OR ONLY) (NEEDLE) ×2 IMPLANT
NEEDLE HYPO 18GX1.5 SHARP (NEEDLE)
NEEDLE SPNL 18GX3.5 QUINCKE PK (NEEDLE) ×2 IMPLANT
NS IRRIG 1000ML POUR BTL (IV SOLUTION) ×2 IMPLANT
PACK TOTAL JOINT (CUSTOM PROCEDURE TRAY) ×2 IMPLANT
PACK UNIVERSAL I (CUSTOM PROCEDURE TRAY) ×2 IMPLANT
PAD ARMBOARD 7.5X6 YLW CONV (MISCELLANEOUS) ×4 IMPLANT
PADDING CAST COTTON 6X4 STRL (CAST SUPPLIES) ×2 IMPLANT
SET HNDPC FAN SPRY TIP SCT (DISPOSABLE) ×1 IMPLANT
SUCTION FRAZIER TIP 10 FR DISP (SUCTIONS) ×2 IMPLANT
SUT VIC AB 0 CT1 27 (SUTURE) ×2
SUT VIC AB 0 CT1 27XBRD ANBCTR (SUTURE) ×1 IMPLANT
SUT VIC AB 1 CTX 36 (SUTURE) ×2
SUT VIC AB 1 CTX36XBRD ANBCTR (SUTURE) ×1 IMPLANT
SUT VIC AB 2-0 CT1 27 (SUTURE)
SUT VIC AB 2-0 CT1 TAPERPNT 27 (SUTURE) ×1 IMPLANT
SUT VIC AB 3-0 CT1 27 (SUTURE)
SUT VIC AB 3-0 CT1 TAPERPNT 27 (SUTURE) ×1 IMPLANT
SUT VIC AB 3-0 FS2 27 (SUTURE) ×1 IMPLANT
SUT VIC AB 3-0 PS1 18 (SUTURE) ×2
SUT VIC AB 3-0 PS1 18XBRD (SUTURE) IMPLANT
SYR 30ML LL (SYRINGE) ×1 IMPLANT
SYR 50ML LL SCALE MARK (SYRINGE) ×2 IMPLANT
TOWEL OR 17X24 6PK STRL BLUE (TOWEL DISPOSABLE) ×2 IMPLANT
TOWEL OR 17X26 10 PK STRL BLUE (TOWEL DISPOSABLE) ×2 IMPLANT
WATER STERILE IRR 1000ML POUR (IV SOLUTION) ×3 IMPLANT

## 2015-01-06 NOTE — Progress Notes (Signed)
Orthopedic Tech Progress Note Patient Details:  Sandra Payne 1941/03/08 [id number]  Patient ID: Sandra Payne, female   DOB: Nov 28, 1940, 74 y.o.   MRN: [id number] Placed pt's rle on cpm @ 0-60 degrees @1740   Hildred Priest 01/06/2015, 7:42 PM

## 2015-01-06 NOTE — Progress Notes (Addendum)
Orthopedic Tech Progress Note Patient Details:  Sandra Payne Nov 25, 1940 [id number] CPM applied to RLE with appropriate settings. OHF applied to bed. CPM order states 10-40 but PACU RN stated PA came around with verbal order for 0-60 range. CPM Right Knee CPM Right Knee: On Right Knee Flexion (Degrees): 60 Right Knee Extension (Degrees): 0   Asia R Thompson 01/06/2015, 12:56 PM

## 2015-01-06 NOTE — Anesthesia Preprocedure Evaluation (Signed)
Anesthesia Evaluation  Patient identified by MRN, date of birth, ID band Patient awake    Reviewed: Allergy & Precautions, NPO status , Patient's Chart, lab work & pertinent test results  History of Anesthesia Complications Negative for: history of anesthetic complications  Airway Mallampati: II  TM Distance: >3 FB Neck ROM: Full    Dental  (+) Dental Advisory Given   Pulmonary former smoker,  breath sounds clear to auscultation        Cardiovascular negative cardio ROS  Rhythm:Regular Rate:Normal     Neuro/Psych negative neurological ROS     GI/Hepatic negative GI ROS, Neg liver ROS,   Endo/Other  negative endocrine ROS  Renal/GU negative Renal ROS     Musculoskeletal  (+) Arthritis -,   Abdominal   Peds  Hematology negative hematology ROS (+)   Anesthesia Other Findings   Reproductive/Obstetrics                             Anesthesia Physical Anesthesia Plan  ASA: II  Anesthesia Plan: Spinal   Post-op Pain Management:    Induction:   Airway Management Planned: Natural Airway and Simple Face Mask  Additional Equipment:   Intra-op Plan:   Post-operative Plan:   Informed Consent: I have reviewed the patients History and Physical, chart, labs and discussed the procedure including the risks, benefits and alternatives for the proposed anesthesia with the patient or authorized representative who has indicated his/her understanding and acceptance.   Dental advisory given  Plan Discussed with: CRNA and Surgeon  Anesthesia Plan Comments: (Plan routine monitors, SAB)        Anesthesia Quick Evaluation

## 2015-01-06 NOTE — Op Note (Signed)
PATIENT ID:      Sandra Payne  MRN:     [id number] DOB/AGE:    [birth year] / 74 y.o.       OPERATIVE REPORT    DATE OF PROCEDURE:  01/06/2015       PREOPERATIVE DIAGNOSIS:   RIGHT KNEE OSTEOARTHRITIS      Estimated body mass index is 28.85 kg/(m^2) as calculated from the following:   Height as of 12/24/14: 4\' 10"  (1.473 m).   Weight as of this encounter: 62.596 kg (138 lb).                                                        POSTOPERATIVE DIAGNOSIS:   RIGHT KNEE OSTEOARTHRITIS                                                                      PROCEDURE:  Procedure(s): RIGHT TOTAL KNEE ARTHROPLASTY Using DepuyAttune RP implants #3R Femur, #3Tibia, 5 mm Attune RP bearing, 35 Patella     SURGEON: Zivah Mayr J    ASSISTANT:   [employer] K[employer]   (Present and scrubbed throughout the case, critical for assistance with exposure, retraction, instrumentation, and closure.)         ANESTHESIA: Spinal, Exparel  EBL: 300cc  FLUID REPLACEMENT: 1500 crystalloid  TOURNIQUET TIME: 57min  Drains: None  Tranexamic Acid: 1gm IV   COMPLICATIONS:  None         INDICATIONS FOR PROCEDURE: The patient has  RIGHT KNEE OSTEOARTHRITIS, varus deformities, XR shows bone on bone arthritis. Patient has failed all conservative measures including anti-inflammatory medicines, narcotics, attempts at  exercise and weight loss, cortisone injections and viscosupplementation.  Risks and benefits of surgery have been discussed, questions answered.   DESCRIPTION OF PROCEDURE: The patient identified by armband, received  IV antibiotics, in the holding area at [facility]. Patient taken to the operating room, appropriate anesthetic  monitors were attached, and general endotracheal anesthesia induced with  the patient in supine position. Tourniquet  applied high to the operative thigh. Lateral post and foot positioner  applied to the table, the lower extremity was then prepped and draped  in usual sterile  fashion from the ankle to the tourniquet. Time-out procedure was performed. We began the operation, with the knee flexed 100 degrees, by making the anterior midline incision starting at handbreadth above the patella going over the patella 1 cm medial to and 4 cm distal to the tibial tubercle. Small bleeders in the skin and the  subcutaneous tissue identified and cauterized. Transverse retinaculum was incised and reflected medially and a medial parapatellar arthrotomy was accomplished. the patella was everted and theprepatellar fat pad resected. The superficial medial collateral  ligament was then elevated from anterior to posterior along the proximal  flare of the tibia and anterior half of the menisci resected. The knee was hyperflexed exposing bone on bone arthritis. Peripheral and notch osteophytes as well as the cruciate ligaments were then resected. We continued to  work our way around posteriorly along the proximal tibia, and externally  rotated the tibia subluxing it out from underneath the femur. A McHale  retractor was placed through the notch and a lateral Hohmann retractor  placed, and we then drilled through the proximal tibia in line with the  axis of the tibia followed by an intramedullary guide rod and 2-degree  posterior slope cutting guide. The tibial cutting guide, 3 degree posterior sloped, was pinned into place allowing resection of 2 mm of bone medially and about 8 mm of bone laterally. Satisfied with the tibial resection, we then  entered the distal femur 2 mm anterior to the PCL origin with the  intramedullary guide rod and applied the distal femoral cutting guide  set at 63mm, with 5 degrees of valgus. This was pinned along the  epicondylar axis. At this point, the distal femoral cut was accomplished without difficulty. We then sized for a #3R femoral component and pinned the guide in 3 degrees of external rotation.The chamfer cutting guide was pinned into place. The anterior,  posterior, and chamfer cuts were accomplished without difficulty followed by  the Attune RP box cutting guide and the box cut. We also removed posterior osteophytes from the posterior femoral condyles. At this  time, the knee was brought into full extension. We checked our  extension and flexion gaps and found them symmetric for a 5 mm bearing. Distracting in extension with a lamina spreader, the posterior horns of the menisci were removed, and Exparel, diluted to 60 cc, was injected into the capsule of the knee. The  posterior patella cut was accomplished with the 9.5 mm Attune cutting guide, sized at 35 dome, and the fixation pegs drilled.The knee  was then once again hyperflexed exposing the proximal tibia. We sized for a #3 tibial base plate, applied the smokestack and the conical reamer followed by the the Delta fin keel punch. We then hammered into place the Attune RP trial femoral component, inserted a  5 mm trial bearing, trial patellar button, and took the knee through range of motion from 0-130 degrees. No thumb pressure was required for patellar  Tracking. At this point, the limb was wrapped with an Esmarch bandage and the tourniquet inflated to 350 mmHg. All trial components were removed, mating surfaces irrigated with pulse lavage, and dried with suction and sponges. A double batch of DePuy HV cement with 1500 mg of Zinacef was mixed and applied to all bony metallic mating surfaces except for the posterior condyles of the femur itself. In order, we  hammered into place the tibial tray and removed excess cement, the femoral component and removed excess cement,  The 5  Attune RP bearing  was inserted, and the knee brought to full extension with compression.  The patellar button was clamped into place, and excess cement  removed. While the cement cured the wound was irrigated out with normal saline solution pulse lavage. Ligament stability and patellar tracking were checked and found to be  excellent. The parapatellar arthrotomy was closed with  running #1 Vicryl suture. The subcutaneous tissue with 0 and 2-0 undyed  Vicryl suture, and the skin with running 3-0 SQ vicryl. A dressing of Xeroform,  4 x 4, dressing sponges, Webril, and Ace wrap applied. The patient  awakened, extubated, and taken to recovery room without difficulty.   Avielle Imbert J 01/06/2015, 11:47 AM

## 2015-01-06 NOTE — Anesthesia Postprocedure Evaluation (Signed)
  Anesthesia Post-op Note  Patient: Sandra Payne  Procedure(s) Performed: Procedure(s): RIGHT TOTAL KNEE ARTHROPLASTY (Right)  Patient Location: PACU  Anesthesia Type:Spinal  Level of Consciousness: awake, alert  and oriented  Airway and Oxygen Therapy: Patient Spontanous Breathing  Post-op Pain: none  Post-op Assessment: Post-op Vital signs reviewed, Patient's Cardiovascular Status Stable and Respiratory Function Stable  Post-op Vital Signs: Reviewed and stable  Last Vitals:  Filed Vitals:   01/06/15 1400  BP:   Pulse: 93  Temp: 36.4 C  Resp: 15    Complications: No apparent anesthesia complications

## 2015-01-06 NOTE — Interval H&P Note (Signed)
History and Physical Interval Note:  01/06/2015 10:02 AM  Sandra Payne  has presented today for surgery, with the diagnosis of RIGHT KNEE OSTEOARTHRITIS  The various methods of treatment have been discussed with the patient and family. After consideration of risks, benefits and other options for treatment, the patient has consented to  Procedure(s): RIGHT TOTAL KNEE ARTHROPLASTY (Right) as a surgical intervention .  The patient's history has been reviewed, patient examined, no change in status, stable for surgery.  I have reviewed the patient's chart and labs.  Questions were answered to the patient's satisfaction.     Kerin Salen

## 2015-01-06 NOTE — Evaluation (Signed)
Physical Therapy Evaluation Patient Details Name: Sandra Payne MRN: [id number] DOB: 10-25-1940 Today's Date: 01/06/2015   History of Present Illness  Patient is a 74 y/o female s/p R TKA. PMH of anemia, arthritis and insomnia.  Clinical Impression  Patient presents with pain and post surgical deficits RLE s/p above surgery impacting mobility. Tolerated short distance ambulation with min A for safety/balance. Education provided on HEP. Pt does not have 24/7 S at home and willing to go to rehab to maximize independence and improve overall mobility prior to return home.    Follow Up Recommendations SNF;Supervision/Assistance - 24 hour    Equipment Recommendations  None recommended by PT    Recommendations for Other Services       Precautions / Restrictions Precautions Precautions: Knee Precaution Booklet Issued: No Precaution Comments: Reviewed no pillow under knee and HEP. Restrictions Weight Bearing Restrictions: Yes RLE Weight Bearing: Weight bearing as tolerated      Mobility  Bed Mobility Overal bed mobility: Needs Assistance Bed Mobility: Supine to Sit     Supine to sit: Min guard;HOB elevated     General bed mobility comments: Cues for technique. No physical assist required. Use of rails for support.   Transfers Overall transfer level: Needs assistance Equipment used: Rolling walker (2 wheeled) Transfers: Sit to/from Stand Sit to Stand: Min assist         General transfer comment: Min A to rise from EOB with multiple attempts. Cues for hand placement.   Ambulation/Gait Ambulation/Gait assistance: Min assist Ambulation Distance (Feet): 10 Feet Assistive device: Rolling walker (2 wheeled) Gait Pattern/deviations: Step-to pattern;Decreased stance time - right;Decreased step length - left;Trunk flexed;Decreased stride length   Gait velocity interpretation: Below normal speed for age/gender General Gait Details: Pt with slow, guarded gait. increased WB  through [place]. Assist with RW management.  Stairs            Wheelchair Mobility    Modified Rankin (Stroke Patients Only)       Balance Overall balance assessment: Needs assistance Sitting-balance support: Feet supported;No upper extremity supported Sitting balance-Leahy Scale: Fair     Standing balance support: During functional activity Standing balance-Leahy Scale: Poor Standing balance comment: Relient on RW.                              Pertinent Vitals/Pain Pain Assessment: 0-10 Pain Score: 6  Pain Location: right knee Pain Descriptors / Indicators: Sore Pain Intervention(s): Limited activity within patient's tolerance;Monitored during session;Repositioned;Premedicated before session    Home Living Family/patient expects to be discharged to:: Private residence Living Arrangements: Alone Available Help at Discharge: Family;Available PRN/intermittently Type of Home: House Home Access: Stairs to enter       Home Equipment: Walker - 2 wheels;Crutches;Bedside commode      Prior Function Level of Independence: Independent               Hand Dominance        Extremity/Trunk Assessment   Upper Extremity Assessment: Defer to OT evaluation           Lower Extremity Assessment: RLE deficits/detail RLE Deficits / Details: Grossly limited AROM/strength secondary to pain/surgery. No knee buckling upon standing.       Communication   Communication: No difficulties  Cognition Arousal/Alertness: Awake/alert Behavior During Therapy: WFL for tasks assessed/performed Overall Cognitive Status: Within Functional Limits for tasks assessed       Memory: Decreased short-term memory  General Comments      Exercises Total Joint Exercises Ankle Circles/Pumps: Both;15 reps;Supine Quad Sets: Right;5 reps;Supine (difficulty comprehending exercise.) Long Arc Quad: Right;5 reps;AAROM;Seated      Assessment/Plan    PT  Assessment Patient needs continued PT services  PT Diagnosis Acute pain;Difficulty walking;Generalized weakness   PT Problem List Decreased strength;Pain;Decreased range of motion;Decreased activity tolerance;Decreased balance;Decreased mobility  PT Treatment Interventions Balance training;Gait training;DME instruction;Functional mobility training;Patient/family education;Therapeutic activities;Therapeutic exercise   PT Goals (Current goals can be found in the Care Plan section) Acute Rehab PT Goals Patient Stated Goal: to go to rehab  PT Goal Formulation: With patient Time For Goal Achievement: 01/20/15 Potential to Achieve Goals: Fair    Frequency 7X/week   Barriers to discharge Decreased caregiver support Pt lives alone.    Co-evaluation               End of Session Equipment Utilized During Treatment: Gait belt Activity Tolerance: Patient tolerated treatment well;Patient limited by pain Patient left: in chair;with call bell/phone within reach Nurse Communication: Mobility status         Time: 1527-1550 PT Time Calculation (min) (ACUTE ONLY): 23 min   Charges:   PT Evaluation $Initial PT Evaluation Tier I: 1 Procedure PT Treatments $Therapeutic Activity: 8-22 mins   PT G Codes:        Brittnie Lewey A Jennyfer Nickolson 01/06/2015, 3:57 PM Wray Kearns, [place], DPT [phone]

## 2015-01-06 NOTE — Anesthesia Procedure Notes (Addendum)
Spinal Patient location during procedure: OR Start time: 01/06/2015 10:30 AM End time: 01/06/2015 10:34 AM Staffing Anesthesiologist: Annye Asa Preanesthetic Checklist Completed: patient identified, site marked, surgical consent, pre-op evaluation, timeout performed, IV checked, risks and benefits discussed and monitors and equipment checked Spinal Block Patient position: sitting Prep: Betadine and ChloraPrep Patient monitoring: heart rate, cardiac monitor, continuous pulse ox and blood pressure Approach: midline Location: L3-4 Injection technique: single-shot Needle Needle type: Quincke  Needle gauge: 25 G Needle length: 5 cm Additional Notes Pt identified in OR.  Monitors applied. Working IV access confirmed. Sitting position, Sterile prep.  #25ga Quincke into clear CSF x4 quads, 12mg  Bupivacaine with dextrose injected with asp of CSF at beginning and end of injection.  Patient asymptomatic, VSS, no heme aspirated, tolerated well.  Jenita Seashore, MD  Procedure Name: [facility] Date/Time: 01/06/2015 10:35 AM Performed by: Jenne Campus Pre-anesthesia Checklist: Patient identified, Emergency Drugs available, Suction available, Patient being monitored and Timeout performed Patient Re-evaluated:Patient Re-evaluated prior to inductionOxygen Delivery Method: Simple face mask

## 2015-01-06 NOTE — Discharge Instructions (Signed)

## 2015-01-06 NOTE — Transfer of Care (Signed)
Immediate Anesthesia Transfer of Care Note  Patient: Sandra Payne  Procedure(s) Performed: Procedure(s): RIGHT TOTAL KNEE ARTHROPLASTY (Right)  Patient Location: PACU  Anesthesia Type:MAC and Spinal  Level of Consciousness: awake, oriented and patient cooperative  Airway & Oxygen Therapy: Patient Spontanous Breathing and Patient connected to nasal cannula oxygen  Post-op Assessment: Report given to RN and Post -op Vital signs reviewed and stable  Post vital signs: Reviewed  Last Vitals:  Filed Vitals:   01/06/15 0812  BP: 124/74  Pulse: 105  Temp: 36.8 C  Resp: 18    Complications: No apparent anesthesia complications

## 2015-01-06 NOTE — Plan of Care (Signed)
Problem: Consults Goal: Diagnosis- Total Joint Replacement Primary Total Knee Right     

## 2015-01-07 LAB — BASIC METABOLIC PANEL
ANION GAP: 7 (ref 5–15)
BUN: 7 mg/dL (ref 6–20)
CO2: 27 mmol/L (ref 22–32)
Calcium: 8.6 mg/dL — ABNORMAL LOW (ref 8.9–10.3)
Chloride: 104 mmol/L (ref 101–111)
Creatinine, Ser: 0.61 mg/dL (ref 0.44–1.00)
Glucose, Bld: 137 mg/dL — ABNORMAL HIGH (ref 65–99)
POTASSIUM: 4.1 mmol/L (ref 3.5–5.1)
SODIUM: 138 mmol/L (ref 135–145)

## 2015-01-07 LAB — CBC
HCT: 32.3 % — ABNORMAL LOW (ref 36.0–46.0)
Hemoglobin: 10.2 g/dL — ABNORMAL LOW (ref 12.0–15.0)
MCH: 23.8 pg — ABNORMAL LOW (ref 26.0–34.0)
MCHC: 31.6 g/dL (ref 30.0–36.0)
MCV: 75.3 fL — ABNORMAL LOW (ref 78.0–100.0)
PLATELETS: 407 10*3/uL — AB (ref 150–400)
RBC: 4.29 MIL/uL (ref 3.87–5.11)
RDW: 15.8 % — ABNORMAL HIGH (ref 11.5–15.5)
WBC: 11.1 10*3/uL — ABNORMAL HIGH (ref 4.0–10.5)

## 2015-01-07 MED ORDER — ZOLPIDEM TARTRATE 5 MG PO TABS: 5.0000 mg | ORAL_TABLET | Freq: Every evening | ORAL | Status: DC | PRN

## 2015-01-07 NOTE — Progress Notes (Signed)
Physical Therapy Treatment Patient Details Name: Sandra Payne MRN: [id number] DOB: 11/15/1940 Today's Date: 01/07/2015    History of Present Illness Patient is a 74 y/o female s/p R TKA. PMH of anemia, arthritis and insomnia.    PT Comments    Patient progressing well and able to increase ambulation distance to hallway. Continue to work on progressing ambulation as tolerated. Continue to recommend SNF for ongoing Physical Therapy.     Follow Up Recommendations  SNF;Supervision/Assistance - 24 hour     Equipment Recommendations  None recommended by PT    Recommendations for Other Services       Precautions / Restrictions Precautions Precautions: Knee Precaution Comments: Reviewed no pillow under knee and HEP. Restrictions RLE Weight Bearing: Weight bearing as tolerated    Mobility  Bed Mobility               General bed mobility comments: patient up in recliner before and after session  Transfers Overall transfer level: Needs assistance Equipment used: Rolling walker (2 wheeled)   Sit to Stand: Min assist         General transfer comment: Min A to power up. Cues for safety and hand placement  Ambulation/Gait Ambulation/Gait assistance: Min guard Ambulation Distance (Feet): 50 Feet Assistive device: Rolling walker (2 wheeled) Gait Pattern/deviations: Step-through pattern;Decreased stride length;Decreased stance time - right;Decreased step length - left   Gait velocity interpretation: Below normal speed for age/gender General Gait Details: Switched to youth size RW with better posture. Cues for sequence and safety   Stairs            Wheelchair Mobility    Modified Rankin (Stroke Patients Only)       Balance                                    Cognition Arousal/Alertness: Awake/alert Behavior During Therapy: WFL for tasks assessed/performed Overall Cognitive Status: Within Functional Limits for tasks assessed                       Exercises Total Joint Exercises Quad Sets: AAROM;Right;10 reps Heel Slides: AAROM;Right;10 reps Hip ABduction/ADduction: AAROM;Right;10 reps Straight Leg Raises: AAROM;Right;10 reps Long Arc Quad: AAROM;Right;10 reps    General Comments        Pertinent Vitals/Pain Pain Score: 5  Pain Location: R knee Pain Descriptors / Indicators: Sore;Aching Pain Intervention(s): Monitored during session    Home Living                      Prior Function            PT Goals (current goals can now be found in the care plan section) Progress towards PT goals: Progressing toward goals    Frequency  7X/week    PT Plan Current plan remains appropriate    Co-evaluation             End of Session Equipment Utilized During Treatment: Gait belt Activity Tolerance: Patient tolerated treatment well Patient left: in chair;with call bell/phone within reach     Time: 1112-1136 PT Time Calculation (min) (ACUTE ONLY): 24 min  Charges:  $Gait Training: 8-22 mins $Therapeutic Exercise: 8-22 mins                    G Codes:      Jacqualyn Posey 01/07/2015, 12:05 PM  01/07/2015 Jacqualyn Posey PTA [phone] pager [phone] office

## 2015-01-07 NOTE — Clinical Social Work Note (Signed)
Patient has accepted private room at [facility] once medically stable for discharge. [facility] admissions liaison to meet with patient at bedside on 01/08/2015 to complete admission paperwork. Patient understanding and agreeable. CSW to continue to follow and assist with discharge planning needs.  Lubertha Sayres, [place] Cell: [phone]       Fax: [phone] Clinical Social Work: Orthopedics [phone]) and Surgical [phone])

## 2015-01-07 NOTE — Clinical Social Work Note (Signed)
Clinical Social Work Assessment  Patient Details  Name: Sandra Payne MRN: [id number] Date of Birth: [birth year]  Date of referral:  01/07/15               Reason for consult:  Discharge Planning, Facility Placement                Permission sought to share information with:    Permission granted to share information::  No  Name::        Agency::     Relationship::     Contact Information:     Housing/Transportation Living arrangements for the past 2 months:  Single Family Home Source of Information:  Patient Patient Interpreter Needed:  None Criminal Activity/Legal Involvement Pertinent to Current Situation/Hospitalization:  No - Comment as needed Significant Relationships:  Friend Lives with:  Self Do you feel safe going back to the place where you live?  No (High fall risk.) Need for family participation in patient care:  No (Coment) (Patient lives alone.)  Care giving concerns:  Patient inquired about possibly having a private room at SNF once medically stable for discharge. CSW informed patient CSW would request private room for patient, but patient may be placed on wait list if no private room available. Patient agreeable and understanding. No other concerns at this time.   Social Worker assessment / plan:  CSW met with patient at bedside to discuss discharge disposition. Patient aware of PT recommendation for SNF placement at time of discharge. Patient expressed preference for [facility]. CSW to continue to follow and initiate appropriate [facility] referrals.  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   Patient/Family's Understanding of and Emotional Response to Diagnosis, Current  Treatment, and Prognosis:  Patient understanding and agreeable to CSW plan of care.  Emotional Assessment Appearance:  Appears stated age Attitude/Demeanor/Rapport:  Other (Pleasant) Affect (typically observed):  Pleasant, Appropriate, Accepting Orientation:  Oriented to Self, Oriented to Place, Oriented to  Time, Oriented to Situation Alcohol / Substance use:  Not Applicable Psych involvement (Current and /or in the community):  No (Comment) (Not appropriate on this admission.)  Discharge Needs  Concerns to be addressed:  No discharge needs identified Readmission within the last 30 days:  No Current discharge risk:  None Barriers to Discharge:  No Barriers Identified   Caroline Sauger, LCSW 01/07/2015, 11:15 AM [phone]

## 2015-01-07 NOTE — Progress Notes (Signed)
Occupational Therapy Evaluation Patient Details Name: Sandra Payne MRN: [id number] DOB: 1941/01/28 Today's Date: 01/07/2015    History of Present Illness Patient is a 74 y/o female s/p R TKA. PMH of anemia, arthritis and insomnia.   Clinical Impression   PTA, pt independent with ADL and mobility. Pt requires min A with ADL and would benefit from short term rehab at SNF to maximize functional level of independence with ADL and IADL tasks, as pt lives alone. Pt does want to purchase a tub bench to use at home. All further OT to be addressed at SNF.     Follow Up Recommendations  SNF;Supervision/Assistance - 24 hour    Equipment Recommendations  Tub/shower bench    Recommendations for Other Services       Precautions / Restrictions Precautions Precautions: Knee Precaution Comments: Reviewed no pillow under knee and HEP. Restrictions RLE Weight Bearing: Weight bearing as tolerated      Mobility Bed Mobility               General bed mobility comments: patient up in recliner before and after session  Transfers Overall transfer level: Needs assistance Equipment used: Rolling walker (2 wheeled)   Sit to Stand: Min guard         General transfer comment: Min A to power up. Cues for safety and hand placement    Balance     Sitting balance-Leahy Scale: Good       Standing balance-Leahy Scale: Poor    reliance on RW                          ADL Overall ADL's : Needs assistance/impaired     Grooming: Set up   Upper Body Bathing: Set up   Lower Body Bathing: Minimal assistance   Upper Body Dressing : Set up   Lower Body Dressing: Minimal assistance   Toilet Transfer: Min guard   Toileting- Clothing Manipulation and Hygiene: Supervision/safety;Sit to/from stand       Functional mobility during ADLs: Min guard General ADL Comments: Pt has difficulty with LB ADL. Plans to go to SNF for rehab. discussed role of OT s/p TKA. Pt  discussing her bathroom set up and talked about her fear of falling when stepping over tub. Recommended using tub bench as option. Pt wants to purchase tub bench.                     Pertinent Vitals/Pain Pain Assessment: 0-10 Pain Score: 3  Pain Location: R knee Pain Descriptors / Indicators: Sore;Aching Pain Intervention(s): Monitored during session     Hand Dominance     Extremity/Trunk Assessment Upper Extremity Assessment Upper Extremity Assessment: Overall WFL for tasks assessed (pt c/o R shoulder weakness)   Lower Extremity Assessment Lower Extremity Assessment: Defer to PT evaluation   Cervical / Trunk Assessment Cervical / Trunk Assessment: Normal   Communication Communication Communication: No difficulties   Cognition Arousal/Alertness: Awake/alert Behavior During Therapy: WFL for tasks assessed/performed Overall Cognitive Status: Within Functional Limits for tasks assessed                                        Home Living Family/patient expects to be discharged to:: Skilled nursing facility  Prior Functioning/Environment Level of Independence: Independent             OT Diagnosis: Generalized weakness;Acute pain   OT Problem List: Decreased strength;Decreased range of motion;Decreased activity tolerance;Decreased knowledge of use of DME or AE;Decreased knowledge of precautions;Pain   OT Treatment/Interventions:      OT Goals(Current goals can be found in the care plan section) Acute Rehab OT Goals Patient Stated Goal: to go to rehab  OT Goal Formulation: All assessment and education complete, DC therapy  OT Frequency:     Barriers to D/C:  lives alone          Co-evaluation              End of Session Equipment Utilized During Treatment: Gait belt;Rolling walker CPM Right Knee CPM Right Knee: Off Nurse Communication: Mobility status  Activity Tolerance:  Patient tolerated treatment well Patient left: in chair;with call bell/phone within reach   Time: 1315-1347 OT Time Calculation (min): 32 min Charges:  OT General Charges $OT Visit: 1 Procedure OT Treatments $Self Care/Home Management : 8-22 mins G-Codes:    Sally-Anne Wamble,HILLARY 02/05/2015, 2:31 PM   [facility], OTR/L  [phone] 02/05/2015

## 2015-01-07 NOTE — Progress Notes (Signed)
Patient ID: Sandra Payne, female   DOB: May 15, 1941, 74 y.o.   MRN: [id number] PATIENT ID: Sandra Payne  MRN: [id number]  DOB/AGE:  1940/09/19 / 74 y.o.  1 Day Post-Op Procedure(s) (LRB): RIGHT TOTAL KNEE ARTHROPLASTY (Right)    PROGRESS NOTE Subjective: Patient is alert, oriented, x1 Nausea, no Vomiting, yes passing gas, no Bowel Movement. Taking PO wel. Denies SOB, Chest or Calf Pain. Using Incentive Spirometer, PAS in place. Ambulate WBAT 60 ft, CPM 0-40 Patient reports pain as 6 on 0-10 scale  .    Objective: Vital signs in last 24 hours: Filed Vitals:   01/06/15 1422 01/06/15 2103 01/07/15 0132 01/07/15 0522  BP: 141/65 128/50 124/63 113/65  Pulse:  109 103 97  Temp: 97.9 F (36.6 C) 99.3 F (37.4 C) 99 F (37.2 C) 99 F (37.2 C)  TempSrc:  Oral Oral Oral  Resp: 18 17 18 17   Weight:      SpO2: 98% 96% 95% 94%      Intake/Output from previous day: I/O last 3 completed shifts: In: 2393.8 [P.O.:240; I.V.:2153.8] Out: 1025 [Urine:975; Blood:50]   Intake/Output this shift:     LABORATORY DATA:  Recent Labs  01/07/15 0502  WBC 11.1*  HGB 10.2*  HCT 32.3*  PLT 407*  NA 138  K 4.1  CL 104  CO2 27  BUN 7  CREATININE 0.61  GLUCOSE 137*  CALCIUM 8.6*    Examination: Neurologically intact ABD soft Neurovascular intact Sensation intact distally Intact pulses distally Dorsiflexion/Plantar flexion intact Incision: no drainage No cellulitis present Compartment soft}  Assessment:   1 Day Post-Op Procedure(s) (LRB): RIGHT TOTAL KNEE ARTHROPLASTY (Right) ADDITIONAL DIAGNOSIS: Expected Acute Blood Loss Anemia,   Plan: PT/OT WBAT, CPM 5/hrs day until ROM 0-90 degrees, then D/C CPM DVT Prophylaxis:  SCDx72hrs, ASA 325 mg BID x 2 weeks DISCHARGE PLAN: Skilled Nursing Facility/Rehab, [facility] DISCHARGE NEEDS: HHPT, CPM, Walker and 3-in-1 comode seat     Lavanda Nevels J 01/07/2015, 7:48 AM

## 2015-01-08 ENCOUNTER — Encounter (HOSPITAL_COMMUNITY): Payer: Self-pay | Admitting: Orthopedic Surgery

## 2015-01-08 LAB — CBC
HCT: 30.8 % — ABNORMAL LOW (ref 36.0–46.0)
Hemoglobin: 9.5 g/dL — ABNORMAL LOW (ref 12.0–15.0)
MCH: 23.3 pg — ABNORMAL LOW (ref 26.0–34.0)
MCHC: 30.8 g/dL (ref 30.0–36.0)
MCV: 75.7 fL — ABNORMAL LOW (ref 78.0–100.0)
PLATELETS: 354 10*3/uL (ref 150–400)
RBC: 4.07 MIL/uL (ref 3.87–5.11)
RDW: 16 % — ABNORMAL HIGH (ref 11.5–15.5)
WBC: 11.4 10*3/uL — AB (ref 4.0–10.5)

## 2015-01-08 MED ORDER — WHITE PETROLATUM GEL
Status: AC
  Administered 2015-01-08: 1
  Filled 2015-01-08: qty 1

## 2015-01-08 NOTE — Discharge Summary (Signed)
Patient ID: Sandra Payne MRN: [id number] DOB/AGE: 72-Jul-1942 72 y.o.  Admit date: 01/06/2015 Discharge date: 01/08/2015  Admission Diagnoses:  Principal Problem:   Primary osteoarthritis of right knee Active Problems:   Arthritis of knee   Discharge Diagnoses:  Same  Past Medical History  Diagnosis Date  . Elevated cholesterol   . Arthralgia   . Insomnia   . Arthritis   . Anemia     Surgeries: Procedure(s): RIGHT TOTAL KNEE ARTHROPLASTY on 01/06/2015   Consultants:    Discharged Condition: Improved  Hospital Course: Sandra Payne is an 74 y.o. female who was admitted 01/06/2015 for operative treatment ofPrimary osteoarthritis of right knee. Patient has severe unremitting pain that affects sleep, daily activities, and work/hobbies. After pre-op clearance the patient was taken to the operating room on 01/06/2015 and underwent  Procedure(s): RIGHT TOTAL KNEE ARTHROPLASTY.    Patient was given perioperative antibiotics: Anti-infectives    Start     Dose/Rate Route Frequency Ordered Stop   01/06/15 1042  cefUROXime (ZINACEF) injection  Status:  Discontinued       As needed 01/06/15 1042 01/06/15 1227   01/06/15 0812  ceFAZolin (ANCEF) 2-3 GM-% IVPB SOLR    Comments:  Ara Kussmaul   : cabinet override      01/06/15 0812 01/06/15 2029   01/06/15 0809  ceFAZolin (ANCEF) IVPB 2 g/50 mL premix     2 g 100 mL/hr over 30 Minutes Intravenous On call to O.R. 01/06/15 0809 01/06/15 1040       Patient was given sequential compression devices, early ambulation, and chemoprophylaxis to prevent DVT.  Patient benefited maximally from hospital stay and there were no complications.    Recent vital signs: Patient Vitals for the past 24 hrs:  BP Temp Temp src Pulse Resp SpO2  01/08/15 0510 97/60 mmHg 98.2 F (36.8 C) Oral 98 17 96 %  01/07/15 2012 (!) 127/59 mmHg 99 F (37.2 C) Oral (!) 106 17 100 %  01/07/15 1244 124/66 mmHg - - (!) 103 18 94 %     Recent laboratory  studies:  Recent Labs  01/07/15 0502 01/08/15 0505  WBC 11.1* 11.4*  HGB 10.2* 9.5*  HCT 32.3* 30.8*  PLT 407* 354  NA 138  --   K 4.1  --   CL 104  --   CO2 27  --   BUN 7  --   CREATININE 0.61  --   GLUCOSE 137*  --   CALCIUM 8.6*  --      Discharge Medications:     Medication List    STOP taking these medications        celecoxib 200 MG capsule  Commonly known as:  CELEBREX      TAKE these medications        aspirin EC 325 MG tablet  Take 1 tablet (325 mg total) by mouth 2 (two) times daily.     meclizine 25 MG tablet  Commonly known as:  ANTIVERT  Take 1 tablet (25 mg total) by mouth 3 (three) times daily as needed for dizziness.     oxyCODONE-acetaminophen 5-325 MG per tablet  Commonly known as:  ROXICET  Take 1 tablet by mouth every 4 (four) hours as needed.     tizanidine 2 MG capsule  Commonly known as:  ZANAFLEX  Take 1 capsule (2 mg total) by mouth 3 (three) times daily.     traMADol 50 MG tablet  Commonly known as:  Veatrice Bourbon  Take 50 mg by mouth every 6 (six) hours as needed (pain).     zolpidem 10 MG tablet  Commonly known as:  AMBIEN  Take 5 mg by mouth at bedtime as needed for sleep.        Diagnostic Studies: Dg Chest 2 View  12/24/2014   CLINICAL DATA:  Preoperative chest x-ray: 74 year old female in preparation for total knee arthroplasty  EXAM: CHEST  2 VIEW  COMPARISON:  Prior chest x-ray 11/02/2013  FINDINGS: The lungs are clear and negative for focal airspace consolidation, pulmonary edema or suspicious pulmonary nodule. No pleural effusion or pneumothorax. Cardiac and mediastinal contours are within normal limits. No acute fracture or lytic or blastic osseous lesions. Multilevel degenerative disc disease. The visualized upper abdominal bowel gas pattern is unremarkable.  IMPRESSION: Negative chest x-ray.   Electronically Signed   By: Jacqulynn Cadet M.D.   On: 12/24/2014 12:42    Disposition: 01-Home or Self Care      Discharge  Instructions    CPM    Complete by:  As directed   Continuous passive motion machine (CPM):      Use the CPM from 0 to 60  for 5 hours per day.      You may increase by 10 degrees per day.  You may break it up into 2 or 3 sessions per day.      Use CPM for 2 weeks or until you are told to stop.     Call MD / Call 911    Complete by:  As directed   If you experience chest pain or shortness of breath, CALL 911 and be transported to the hospital emergency room.  If you develope a fever above 101 F, pus (white drainage) or increased drainage or redness at the wound, or calf pain, call your surgeon's office.     Change dressing    Complete by:  As directed   Change dressing on day 5, then change the dressing daily with sterile 4 x 4 inch gauze dressing and apply TED hose.  You may clean the incision with alcohol prior to redressing.     Constipation Prevention    Complete by:  As directed   Drink plenty of fluids.  Prune juice may be helpful.  You may use a stool softener, such as Colace (over the counter) 100 mg twice a day.  Use MiraLax (over the counter) for constipation as needed.     Diet - low sodium heart healthy    Complete by:  As directed      Discharge instructions    Complete by:  As directed   Follow up in office with Dr. Mayer Camel in 2 weeks.     Driving restrictions    Complete by:  As directed   No driving for 2 weeks     Increase activity slowly as tolerated    Complete by:  As directed      Patient may shower    Complete by:  As directed   You may shower without a dressing once there is no drainage.  Do not wash over the wound.  If drainage remains, cover wound with plastic wrap and then shower.           Follow-up Information    Follow up with Kerin Salen, MD In 2 weeks.   Specialty:  Orthopedic Surgery   Contact information:   [place] [address] [phone]  Signed: Kullen Tomasetti R 01/08/2015, 7:37 AM

## 2015-01-08 NOTE — Clinical Social Work Placement (Signed)
   CLINICAL SOCIAL WORK PLACEMENT  NOTE  Date:  01/08/2015  Patient Details  Name: Sandra Payne MRN: [id number] Date of Birth: 03/17/1941  Clinical Social Work is seeking post-discharge placement for this patient at the [place] level of care (*CSW will initial, date and re-position this form in  chart as items are completed):  No   Patient/family provided with [facility] [place] [facility] Work Department's list of facilities offering this level of care within the geographic area requested by the patient (or if unable, by the patient's family).  No   Patient/family informed of their freedom to choose among providers that offer the needed level of care, that participate in Medicare, Medicaid or managed care program needed by the patient, have an available bed and are willing to accept the patient.  No   Patient/family informed of [facility]'s ownership interest in [facility] and [facility], as well as of the fact that they are under no obligation to receive care at these facilities.  PASRR submitted to EDS on 01/07/15     PASRR number received on 01/07/15     Existing PASRR number confirmed on       FL2 transmitted to all facilities in geographic area requested by pt/family on 01/07/15     FL2 transmitted to all facilities within larger geographic area on       Patient informed that his/her managed care company has contracts with or will negotiate with certain facilities, including the following:        Yes   Patient/family informed of bed offers received.  Patient chooses bed at [facility]     Physician recommends and patient chooses bed at      Patient to be transferred to [facility] on 01/08/15.  Patient to be transferred to facility by [facility]     Patient family notified on 01/08/15 of transfer.  Name of family member notified:  Patient alert and oriented.     PHYSICIAN       Additional Comment:     _______________________________________________ Caroline Sauger, LCSW 01/08/2015, 9:38 AM [phone]

## 2015-01-08 NOTE — Discharge Planning (Signed)
Patient to be discharged to [facility]. Patient updated at bedside.  [employer] auth: [id number]  Facility: [facility] Report: [id number] Transportation: EMS ([address])  Lubertha Sayres, [place] [phone]) and Surgical [phone])

## 2015-01-08 NOTE — Progress Notes (Signed)
PATIENT ID: Sandra Payne  MRN: [id number]  DOB/AGE:  October 23, 1940 / 74 y.o.  2 Days Post-Op Procedure(s) (LRB): RIGHT TOTAL KNEE ARTHROPLASTY (Right)    PROGRESS NOTE Subjective: Patient is alert, oriented, no Nausea, no Vomiting, yes passing gas, no Bowel Movement. Taking PO well, with pt up and eating in room.. Denies SOB, Chest or Calf Pain. Using Incentive Spirometer, PAS in place. Ambulate WBAT, CPM 0-60 Patient reports pain as 6 on 0-10 scale  .    Objective: Vital signs in last 24 hours: Filed Vitals:   01/07/15 0522 01/07/15 1244 01/07/15 2012 01/08/15 0510  BP: 113/65 124/66 127/59 97/60  Pulse: 97 103 106 98  Temp: 99 F (37.2 C)  99 F (37.2 C) 98.2 F (36.8 C)  TempSrc: Oral  Oral Oral  Resp: 17 18 17 17   Weight:      SpO2: 94% 94% 100% 96%      Intake/Output from previous day: I/O last 3 completed shifts: In: 1993.8 [P.O.:720; I.V.:1273.8] Out: 550 [Urine:550]   Intake/Output this shift:     LABORATORY DATA:  Recent Labs  01/07/15 0502 01/08/15 0505  WBC 11.1* 11.4*  HGB 10.2* 9.5*  HCT 32.3* 30.8*  PLT 407* 354  NA 138  --   K 4.1  --   CL 104  --   CO2 27  --   BUN 7  --   CREATININE 0.61  --   GLUCOSE 137*  --   CALCIUM 8.6*  --     Examination: Neurologically intact Neurovascular intact Sensation intact distally Intact pulses distally Dorsiflexion/Plantar flexion intact Incision: dressing C/D/I No cellulitis present Compartment soft}  Assessment:   2 Days Post-Op Procedure(s) (LRB): RIGHT TOTAL KNEE ARTHROPLASTY (Right) ADDITIONAL DIAGNOSIS: Expected Acute Blood Loss Anemia,   Plan: PT/OT WBAT, CPM 5/hrs day until ROM 0-90 degrees, then D/C CPM DVT Prophylaxis:  SCDx72hrs, ASA 325 mg BID x 2 weeks DISCHARGE PLAN: Skilled Nursing Facility/Rehab[facility] DISCHARGE NEEDS: HHPT, HHRN, CPM, Walker and 3-in-1 comode seat     Samantha Ragen R 01/08/2015, 7:34 AM

## 2015-01-08 NOTE — Progress Notes (Signed)
Physical Therapy Treatment Patient Details Name: Sandra Payne MRN: [id number] DOB: November 22, 1940 Today's Date: 01/08/2015    History of Present Illness Patient is a 74 y/o female s/p R TKA. PMH of anemia, arthritis and insomnia.    PT Comments    Patient continues to make steady progress with mobility. Continue to recommend SNF for ongoing Physical Therapy.     Follow Up Recommendations  SNF;Supervision/Assistance - 24 hour     Equipment Recommendations  None recommended by PT    Recommendations for Other Services       Precautions / Restrictions Precautions Precautions: Knee Precaution Comments: Reviewed no pillow under knee and HEP. Restrictions RLE Weight Bearing: Weight bearing as tolerated    Mobility  Bed Mobility               General bed mobility comments: Patient up in recliner before and after session. Stated that she was able to get her leg out of bed without assistance  Transfers Overall transfer level: Needs assistance Equipment used: Rolling walker (2 wheeled)   Sit to Stand: Min guard         General transfer comment:  Cues for safety and hand placement  Ambulation/Gait Ambulation/Gait assistance: Min guard Ambulation Distance (Feet): 90 Feet Assistive device: Rolling walker (2 wheeled) Gait Pattern/deviations: Step-to pattern;Decreased stance time - right;Decreased step length - left   Gait velocity interpretation: Below normal speed for age/gender General Gait Details: Cues for safe positioning of RW and cues for upright posture. Patient guarded with gait   Stairs            Wheelchair Mobility    Modified Rankin (Stroke Patients Only)       Balance                                    Cognition Arousal/Alertness: Awake/alert Behavior During Therapy: WFL for tasks assessed/performed Overall Cognitive Status: Within Functional Limits for tasks assessed                      Exercises Total Joint  Exercises Quad Sets: Right;10 reps;AROM Heel Slides: AAROM;Right;10 reps Hip ABduction/ADduction: AAROM;Right;10 reps Straight Leg Raises: AAROM;Right;10 reps Long Arc Quad: AAROM;Right;10 reps    General Comments        Pertinent Vitals/Pain Pain Score: 4  Pain Location: R knee Pain Descriptors / Indicators: Aching;Sore Pain Intervention(s): Monitored during session    Home Living                      Prior Function            PT Goals (current goals can now be found in the care plan section) Progress towards PT goals: Progressing toward goals    Frequency  7X/week    PT Plan Current plan remains appropriate    Co-evaluation             End of Session Equipment Utilized During Treatment: Gait belt Activity Tolerance: Patient tolerated treatment well Patient left: in chair;with call bell/phone within reach     Time: 0903-0927 PT Time Calculation (min) (ACUTE ONLY): 24 min  Charges:  $Gait Training: 8-22 mins $Therapeutic Exercise: 8-22 mins                    G Codes:      Jacqualyn Posey 01/08/2015, 9:30 AM  01/08/2015  Jacqualyn Posey PTA [phone] pager [phone] office

## 2015-01-09 ENCOUNTER — Non-Acute Institutional Stay: Payer: PPO | Admitting: Registered Nurse

## 2015-01-09 ENCOUNTER — Encounter: Payer: Self-pay | Admitting: Registered Nurse

## 2015-01-09 DIAGNOSIS — K5901 Slow transit constipation: Secondary | ICD-10-CM

## 2015-01-09 DIAGNOSIS — D72829 Elevated white blood cell count, unspecified: Secondary | ICD-10-CM

## 2015-01-09 DIAGNOSIS — M1711 Unilateral primary osteoarthritis, right knee: Secondary | ICD-10-CM | POA: Diagnosis not present

## 2015-01-09 DIAGNOSIS — R112 Nausea with vomiting, unspecified: Secondary | ICD-10-CM | POA: Diagnosis not present

## 2015-01-09 DIAGNOSIS — H8110 Benign paroxysmal vertigo, unspecified ear: Secondary | ICD-10-CM | POA: Diagnosis not present

## 2015-01-09 DIAGNOSIS — D62 Acute posthemorrhagic anemia: Secondary | ICD-10-CM

## 2015-01-09 DIAGNOSIS — G4700 Insomnia, unspecified: Secondary | ICD-10-CM | POA: Diagnosis not present

## 2015-01-09 NOTE — Progress Notes (Signed)
Patient ID: Sandra Payne, female   DOB: Sep 24, 1940, 74 y.o.   MRN: [id number]   Place of Service: [facility]  Allergies  Allergen Reactions  . Codeine Itching  . Sulfa Antibiotics Other (See Comments)    Childhood allergy    Code Status: Full Code  Goals of Care: Longevity/STR  Chief Complaint  Patient presents with  . Hospitalization Follow-up    HPI Reviewed of hospital record showed 74 y.o. female with PMH of OA, HLD, anemia, insomnia is being seen for a follow-up visit post hospital admission from 01/06/15 to 01/08/15 with primary OA of right knee. She underwent right total knee arthroplasty on 01/06/15 without any complications. She is here for short term rehab and her goal is to return home. Seen in room today. Reported pain is adequately controlled with current regimen. Also reported having issues with n/v x2 and mild constipation. Has small BM yesterday and today but stool was hard. Denies any other concerns.   Review of Systems Constitutional: Negative for fever, chills, and fatigue. HENT: Negative for ear pain, congestion, and sore throat Eyes: Negative for eye pain, eye discharge, and visual disturbance  Cardiovascular: Negative for chest pain, palpitations, and leg swelling Respiratory: Negative cough, shortness of breath, and wheezing.  Gastrointestinal: See HPI Genitourinary: Negative for dysuria Endocrine: Negative for polydipsia, polyphagia, and polyuria Musculoskeletal: Negative for uncontrolled pain Neurological: Negative for dizziness and headache Skin: Negative for rash and itchiness.   Psychiatric: Negative for depression   Past Medical History  Diagnosis Date  . Elevated cholesterol   . Arthralgia   . Insomnia   . Arthritis   . Anemia     Past Surgical History  Procedure Laterality Date  . Abdominal hysterectomy      partial  . Total knee arthroplasty Right 01/06/2015    Procedure: RIGHT TOTAL KNEE ARTHROPLASTY;  Surgeon: Frederik Pear,  MD;  Location: [place];  Service: Orthopedics;  Laterality: Right;    History  Substance Use Topics  . Smoking status: Former Smoker -- 0.25 packs/day for 2 years    Types: Cigarettes  . Smokeless tobacco: Not on file  . Alcohol Use: 0.0 oz/week    0 Standard drinks or equivalent per week     Comment: occasionally    Family History  Problem Relation Age of Onset  . Diabetes Mother   . Hypertension Mother       Medication List       This list is accurate as of: 01/09/15  4:46 PM.  Always use your most recent med list.               aspirin EC 325 MG tablet  Take 1 tablet (325 mg total) by mouth 2 (two) times daily.     docusate sodium 100 MG capsule  Commonly known as:  COLACE  Take 100 mg by mouth 2 (two) times daily.     ferrous sulfate 325 (65 FE) MG tablet  Take 325 mg by mouth 2 (two) times daily with a meal.     meclizine 25 MG tablet  Commonly known as:  ANTIVERT  Take 1 tablet (25 mg total) by mouth 3 (three) times daily as needed for dizziness.     oxyCODONE-acetaminophen 5-325 MG per tablet  Commonly known as:  ROXICET  Take 1 tablet by mouth every 4 (four) hours as needed.     polyethylene glycol packet  Commonly known as:  MIRALAX / GLYCOLAX  Take 17 g  by mouth daily. Hold for diarrhea     promethazine 25 MG tablet  Commonly known as:  PHENERGAN  Take 25 mg by mouth every 8 (eight) hours as needed for nausea or vomiting.     tizanidine 2 MG capsule  Commonly known as:  ZANAFLEX  Take 1 capsule (2 mg total) by mouth 3 (three) times daily.     traMADol 50 MG tablet  Commonly known as:  ULTRAM  Take 50 mg by mouth every 6 (six) hours as needed (pain).     zolpidem 10 MG tablet  Commonly known as:  AMBIEN  Take 5 mg by mouth at bedtime as needed for sleep.        Physical Exam  BP 122/69 mmHg  Pulse 98  Temp(Src) 97.6 F (36.4 C)  Resp 16  SpO2 98%  Constitutional: WDWN elderly female in no acute distress. Conversant and  pleasant HEENT: Normocephalic and atraumatic. PERRL. EOM intact. No icterus. Oral mucosa moist. Posterior pharynx clear of any exudate or lesions.  Neck: Supple and nontender. No lymphadenopathy, masses, or thyromegaly. No JVD or carotid bruits. Cardiac: Normal S1, S2. RRR without appreciable murmurs, rubs, or gallops. Distal pulses intact. No dependent edema.  Respiratory: Unlabored respiration. Breath sounds clear bilaterally without rales, rhonchi, or wheezes. GI: Audible bowel sounds in all quadrants. Soft, nontender, nondistended. .  Musculoskeletal: able to move all extremities. Limited ROM of RLE with ACE bandage in place-order not to remove from surgeon.  Skin: Warm and dry. No rash noted.  Neurological: Alert and oriented to person, place, and time. No focal deficits.  Psychiatric: Judgment and insight adequate. Appropriate mood and affect.   Labs Reviewed  CBC Latest Ref Rng 01/08/2015 01/07/2015 12/24/2014  WBC 4.0 - 10.5 K/uL 11.4(H) 11.1(H) 7.0  Hemoglobin 12.0 - 15.0 g/dL 9.5(L) 10.2(L) 11.7(L)  Hematocrit 36.0 - 46.0 % 30.8(L) 32.3(L) 37.4  Platelets 150 - 400 K/uL 354 407(H) 556(H)    CMP Latest Ref Rng 01/07/2015 12/24/2014  Glucose 65 - 99 mg/dL 137(H) 93  BUN 6 - 20 mg/dL 7 12  Creatinine 0.44 - 1.00 mg/dL 0.61 0.89  Sodium 135 - 145 mmol/L 138 143  Potassium 3.5 - 5.1 mmol/L 4.1 3.9  Chloride 101 - 111 mmol/L 104 105  CO2 22 - 32 mmol/L 27 30  Calcium 8.9 - 10.3 mg/dL 8.6(L) 9.6   Diagnostic Studies Reviewed 12/24/14: EKG-NSR 5/316: CXR The lungs are clear and negative for focal airspace consolidation, pulmonary edema or suspicious pulmonary nodule. No pleural effusion or pneumothorax. Cardiac and mediastinal contours are within normal limits. No acute fracture or lytic or blastic osseous lesions. Multilevel degenerative disc disease. The visualized upper abdominal bowel gas pattern is unremarkable.  Assessment & Plan 1. Benign paroxysmal positional vertigo, unspecified  laterality Continue meclizine 25mg  three times daily as needed for dizziness. Fall precautions   2. Primary osteoarthritis of right knee S/p right total knee arthroplasty. Continue tramadol 50mg  every six hours as needed for pain and Percocet 5/325mg  every 4 hours as needed for pain with zanaflex 2mg  three times daily for spasms. Continue asa 325mg  twice daily for DVT prophylaxis. Continue to work with PT/OT for gait/strength/balance training to restore/maximize function. Fall risk precautions. Continue to f/u with ortho   3. Insomnia Continue ambien 5mg  daily at bedtime as needed for sleep  4. Slow transit constipation Colace 100mg  twice daily with miralax 17g daily-hold for diarrhea. Encourage hydration and ambulation as tolerated  5. Acute blood loss anemia Most  likely post op. Also has hx of anemia. Last hgb 9.5. Start ferrous sulfate 325mg  twice daily for now. Continue to monitor h&h  6. Leukocytosis Most likely post op. Continue to monitor for signs of infeciton  7. Nausea and vomiting Promethazine 25mg  every 8 hours as needed for n/v. Reassess and continue to monitor.    Diagnostic Studies/Labs Ordered: cbc, bmp in 1 week  Time: 40 minutes w/ >50% of total time spent on care coordination   Family/Staff Communication Plan of care discussed with patient and nursing staff. Patient and nursing staff verbalized understanding and agree with plan of care. No additional questions or concerns reported.    Arthur Holms, MSN, AGNP-C [facility] [address] [place], [place] [address] [phone] [8am-5pm] After hours: [phone]

## 2015-01-10 ENCOUNTER — Non-Acute Institutional Stay (SKILLED_NURSING_FACILITY): Payer: PPO | Admitting: Internal Medicine

## 2015-01-10 DIAGNOSIS — D62 Acute posthemorrhagic anemia: Secondary | ICD-10-CM | POA: Diagnosis not present

## 2015-01-10 DIAGNOSIS — K5901 Slow transit constipation: Secondary | ICD-10-CM

## 2015-01-10 DIAGNOSIS — G4700 Insomnia, unspecified: Secondary | ICD-10-CM | POA: Diagnosis not present

## 2015-01-10 DIAGNOSIS — M1711 Unilateral primary osteoarthritis, right knee: Secondary | ICD-10-CM

## 2015-01-10 DIAGNOSIS — H8110 Benign paroxysmal vertigo, unspecified ear: Secondary | ICD-10-CM | POA: Diagnosis not present

## 2015-01-10 NOTE — Progress Notes (Signed)
Patient ID: Sandra Payne, female   DOB: 1941/01/13, 74 y.o.   MRN: [id number]     Facility: [facility]    PCP: Lilian Coma, MD  Code Status: full code  Allergies  Allergen Reactions  . Codeine Itching  . Sulfa Antibiotics Other (See Comments)    Childhood allergy    Chief Complaint  Patient presents with  . New Admit To SNF     HPI:  74 year old patient is here for short term rehabilitation post hospital admission from 01/06/15 - 01/08/15 with right knee osteoarthritis. She underwent right total knee arthroplasty. She is seen in her room today. Her pain is under control. She would like a regular diet instead of cardiac diet. She has PMH of OA, HLD, anemia.  Review of Systems:  Constitutional: Negative for fever, chills, diaphoresis.  HENT: Negative for headache, congestion Eyes: Negative for eye pain, blurred vision, double vision and discharge.  Respiratory: Negative for cough, shortness of breath and wheezing.   Cardiovascular: Negative for chest pain, palpitations, leg swelling.  Gastrointestinal: Negative for heartburn, nausea, vomiting, abdominal pain. Had hard stool two days back, took some prune juice and now had 5 bowel movement yesterday and two today. Denies loose stool. Normal regimen in 1-2 bowel movement a day Genitourinary: Negative for dysuria Musculoskeletal: Negative for back pain, falls Skin: Negative for itching, rash.  Neurological: Negative for weakness,dizziness, tingling Psychiatric/Behavioral: Negative for depression   Past Medical History  Diagnosis Date  . Elevated cholesterol   . Arthralgia   . Insomnia   . Arthritis   . Anemia    Past Surgical History  Procedure Laterality Date  . Abdominal hysterectomy      partial  . Total knee arthroplasty Right 01/06/2015    Procedure: RIGHT TOTAL KNEE ARTHROPLASTY;  Surgeon: Frederik Pear, MD;  Location: [place];  Service: Orthopedics;  Laterality: Right;   Social  History:   reports that she has quit smoking. Her smoking use included Cigarettes. She has a .5 pack-year smoking history. She does not have any smokeless tobacco history on file. She reports that she drinks alcohol. She reports that she does not use illicit drugs.  Family History  Problem Relation Age of Onset  . Diabetes Mother   . Hypertension Mother     Medications: Patient's Medications  New Prescriptions   No medications on file  Previous Medications   ASPIRIN EC 325 MG TABLET    Take 1 tablet (325 mg total) by mouth 2 (two) times daily.   DOCUSATE SODIUM (COLACE) 100 MG CAPSULE    Take 100 mg by mouth 2 (two) times daily.   FERROUS SULFATE 325 (65 FE) MG TABLET    Take 325 mg by mouth 2 (two) times daily with a meal.   MECLIZINE (ANTIVERT) 25 MG TABLET    Take 1 tablet (25 mg total) by mouth 3 (three) times daily as needed for dizziness.   OXYCODONE-ACETAMINOPHEN (ROXICET) 5-325 MG PER TABLET    Take 1 tablet by mouth every 4 (four) hours as needed.   POLYETHYLENE GLYCOL (MIRALAX / GLYCOLAX) PACKET    Take 17 g by mouth daily. Hold for diarrhea   PROMETHAZINE (PHENERGAN) 25 MG TABLET    Take 25 mg by mouth every 8 (eight) hours as needed for nausea or vomiting.   TIZANIDINE (ZANAFLEX) 2 MG CAPSULE    Take 1 capsule (2 mg total) by mouth 3 (three) times daily.   TRAMADOL (ULTRAM) 50  MG TABLET    Take 50 mg by mouth every 6 (six) hours as needed (pain).    ZOLPIDEM (AMBIEN) 10 MG TABLET    Take 5 mg by mouth at bedtime as needed for sleep.  Modified Medications   No medications on file  Discontinued Medications   No medications on file     Physical Exam: Filed Vitals:   01/10/15 1205  BP: 114/69  Pulse: 88  Temp: 98.9 F (37.2 C)  Resp: 18  Height: 4\' 10"  (1.473 m)  Weight: 136 lb 8 oz (61.916 kg)  SpO2: 96%    General- elderly female, in no acute distress Head- normocephalic, atraumatic Throat- moist mucus membrane Eyes- PERRLA, EOMI, no pallor, no icterus, no  discharge, normal conjunctiva, normal sclera Neck- no cervical lymphadenopathy Cardiovascular- normal s1,s2, no murmurs, palpable dorsalis pedis, trace right leg edema Respiratory- bilateral clear to auscultation, no wheeze, no rhonchi, no crackles, no use of accessory muscles Abdomen- bowel sounds present, soft, non tender Musculoskeletal- able to move all 4 extremities, right leg rom limited with pain Neurological- no focal deficit Skin- warm and dry, ace wrap on right knee area Psychiatry- alert and oriented to person, place and time, normal mood and affect    Labs reviewed: Basic Metabolic Panel:  Recent Labs  12/24/14 1101 01/07/15 0502  NA 143 138  K 3.9 4.1  CL 105 104  CO2 30 27  GLUCOSE 93 137*  BUN 12 7  CREATININE 0.89 0.61  CALCIUM 9.6 8.6*   CBC:  Recent Labs  12/24/14 1101 01/07/15 0502 01/08/15 0505  WBC 7.0 11.1* 11.4*  NEUTROABS 3.4  --   --   HGB 11.7* 10.2* 9.5*  HCT 37.4 32.3* 30.8*  MCV 75.7* 75.3* 75.7*  PLT 556* 407* 354    Assessment/Plan  Primary osteoarthritis of right knee S/p right total knee arthroplasty. Will have her work with physical therapy and occupational therapy team to help with gait training and muscle strengthening exercises.fall precautions. Skin care. Encourage to be out of bed. Continue tramadol 50mg  q6h prn and Percocet 5/325mg  q4h prn for pain. Continue zanaflex 2 mg tid for spasms. Continue asa 325mg  twice daily for DVT prophylaxis. Has follow up with orthopedics  Constipation Had 5 bowel movement yesterday and 2 so far today. Change miralax to prn and change colace to 100 mg daily  Blood loss anemia Last hb 9.5. Continue feso4 325 mg bid and monitor h&h  BPV Continue meclizine 25mg  tid prn, slow position change recommended  Insomnia Continue ambien 5mg  qhs prn   Goals of care: short term rehabilitation   Labs/tests ordered: cbc   Family/ staff Communication: reviewed care plan with patient and nursing  supervisor    Blanchie Serve, MD  [facility] [phone] (Monday-Friday 8 am - 5 pm) [phone] (afterhours)

## 2015-01-16 ENCOUNTER — Encounter: Payer: Self-pay | Admitting: Registered Nurse

## 2015-01-16 ENCOUNTER — Non-Acute Institutional Stay (SKILLED_NURSING_FACILITY): Payer: PPO | Admitting: Registered Nurse

## 2015-01-16 DIAGNOSIS — G8918 Other acute postprocedural pain: Secondary | ICD-10-CM | POA: Diagnosis not present

## 2015-01-16 DIAGNOSIS — M25561 Pain in right knee: Secondary | ICD-10-CM | POA: Diagnosis not present

## 2015-01-16 DIAGNOSIS — D62 Acute posthemorrhagic anemia: Secondary | ICD-10-CM

## 2015-01-16 LAB — BASIC METABOLIC PANEL
BUN: 12 mg/dL (ref 4–21)
Creatinine: 0.6 mg/dL (ref 0.5–1.1)
Glucose: 96 mg/dL
Potassium: 4.6 mmol/L (ref 3.4–5.3)
SODIUM: 137 mmol/L (ref 137–147)

## 2015-01-16 LAB — CBC AND DIFFERENTIAL
HEMATOCRIT: 30 % — AB (ref 36–46)
Hemoglobin: 9.7 g/dL — AB (ref 12.0–16.0)
WBC: 11.4 10^3/mL

## 2015-01-16 NOTE — Progress Notes (Signed)
Patient ID: Sandra Payne, female   DOB: 1941-04-12, 74 y.o.   MRN: [id number]   Place of Service: [facility]  Allergies  Allergen Reactions  . Codeine Itching  . Sulfa Antibiotics Other (See Comments)    Childhood allergy    Code Status: Full Code  Goals of Care: Longevity/STR  Chief Complaint  Patient presents with  . Acute Visit    pain management     HPI 74 y.o. female with PMH of OA, HLD, anemia, insomnia, s/p Right TKA is being seen for an acute visit for pain management. Per patient, current pain regimen is not adequate-as med wears off quickly. She would like to know if she could have more pain med. Describes pain as sharp. Rates pain of right knee 8/10 at time of visit. Pain is worse with movement. Also wants to know if her anemia is getting better and whether she should continue iron supplement if she goes home. States that she has never had any problem with being anemic. No other concerns reported. Denies any trauma/injury tor right knee.   Review of Systems Constitutional: Negative for fever, chills, and fatigue. Cardiovascular: Negative for chest pain, palpitations, and leg swelling Respiratory: Negative cough, shortness of breath, and wheezing.  Musculoskeletal: See HPI. Denies muscle spasms  Past Medical History  Diagnosis Date  . Elevated cholesterol   . Arthralgia   . Insomnia   . Arthritis   . Anemia     Past Surgical History  Procedure Laterality Date  . Abdominal hysterectomy      partial  . Total knee arthroplasty Right 01/06/2015    Procedure: RIGHT TOTAL KNEE ARTHROPLASTY;  Surgeon: Frederik Pear, MD;  Location: [place];  Service: Orthopedics;  Laterality: Right;    History  Substance Use Topics  . Smoking status: Former Smoker -- 0.25 packs/day for 2 years    Types: Cigarettes  . Smokeless tobacco: Not on file  . Alcohol Use: 0.0 oz/week    0 Standard drinks or equivalent per week     Comment: occasionally    Family History    Problem Relation Age of Onset  . Diabetes Mother   . Hypertension Mother       Medication List       This list is accurate as of: 01/16/15  3:25 PM.  Always use your most recent med list.               aspirin EC 325 MG tablet  Take 1 tablet (325 mg total) by mouth 2 (two) times daily.     docusate sodium 100 MG capsule  Commonly known as:  COLACE  Take 100 mg by mouth 2 (two) times daily.     ferrous sulfate 325 (65 FE) MG tablet  Take 325 mg by mouth 2 (two) times daily with a meal.     meclizine 25 MG tablet  Commonly known as:  ANTIVERT  Take 1 tablet (25 mg total) by mouth 3 (three) times daily as needed for dizziness.     oxyCODONE-acetaminophen 5-325 MG per tablet  Commonly known as:  ROXICET  Take 1 tablet by mouth every 4 (four) hours as needed.     polyethylene glycol packet  Commonly known as:  MIRALAX / GLYCOLAX  Take 17 g by mouth daily. Hold for diarrhea     promethazine 25 MG tablet  Commonly known as:  PHENERGAN  Take 25 mg by mouth every 8 (eight) hours as needed for  nausea or vomiting.     tizanidine 2 MG capsule  Commonly known as:  ZANAFLEX  Take 1 capsule (2 mg total) by mouth 3 (three) times daily.     traMADol 50 MG tablet  Commonly known as:  ULTRAM  Take 50 mg by mouth every 6 (six) hours as needed (pain).     zolpidem 10 MG tablet  Commonly known as:  AMBIEN  Take 5 mg by mouth at bedtime as needed for sleep.        Physical Exam  BP 124/74 mmHg  Pulse 97  Temp(Src) 98.7 F (37.1 C)  Resp 18  Constitutional: WDWN elderly female in no acute distress. Conversant and pleasant HEENT: Normocephalic and atraumatic.  Cardiac: Normal S1, S2. RRR without appreciable murmurs, rubs, or gallops.  Respiratory: Unlabored respiration. Breath sounds clear bilaterally without rales, rhonchi, or wheezes. GI: Audible bowel sounds in all quadrants.  Musculoskeletal: able to move all extremities. Right knee surgical incision healing well. No  signs of infection noted. Dressing changed today.  Skin: Warm and dry. No rash noted.  Neurological: Alert and oriented to person, place, and time. No focal deficits.  Psychiatric: Judgment and insight adequate. Appropriate mood and affect.   Labs Reviewed  CBC Latest Ref Rng 01/16/2015 01/08/2015 01/07/2015  WBC - 11.4 11.4(H) 11.1(H)  Hemoglobin 12.0 - 16.0 g/dL 9.7(A) 9.5(L) 10.2(L)  Hematocrit 36 - 46 % 30(A) 30.8(L) 32.3(L)  Platelets 150 - 400 K/uL - 354 407(H)    CMP Latest Ref Rng 01/16/2015 01/07/2015 12/24/2014  Glucose 65 - 99 mg/dL - 137(H) 93  BUN 4 - 21 mg/dL 12 7 12   Creatinine 0.5 - 1.1 mg/dL 0.6 0.61 0.89  Sodium 137 - 147 mmol/L 137 138 143  Potassium 3.4 - 5.3 mmol/L 4.6 4.1 3.9  Chloride 101 - 111 mmol/L - 104 105  CO2 22 - 32 mmol/L - 27 30  Calcium 8.9 - 10.3 mg/dL - 8.6(L) 9.6   Diagnostic Studies Reviewed 12/24/14: EKG-NSR 5/316: CXR The lungs are clear and negative for focal airspace consolidation, pulmonary edema or suspicious pulmonary nodule. No pleural effusion or pneumothorax. Cardiac and mediastinal contours are within normal limits. No acute fracture or lytic or blastic osseous lesions. Multilevel degenerative disc disease. The visualized upper abdominal bowel gas pattern is unremarkable.  Assessment & Plan 1. Postoperative pain of right knee S/p Right TKA. Continue tramadol 50mg  every six hours as needed for pain. Change Percocet 5/325mg  to 1-2 tabs every 4 hours as needed for pain with zanaflex 2mg  three times daily for spasms. Reassess and continue to monitor   2. Acute blood loss anemia Stable. Recent hgb 9.7. Continue ferrous sulfate 325mg  twice daily for now. Explain to patient that she needs to continue iron supplement until she sees her pcp for f/u and further anemia work up due to low mcv.   Family/Staff Communication Plan of care discussed with patient and nursing staff. Patient and nursing staff verbalized understanding and agree with plan of  care. No additional questions or concerns reported.    Arthur Holms, MSN, AGNP-C [facility] [address] [place], [place] [address] [phone] [8am-5pm] After hours: [phone]

## 2015-01-21 ENCOUNTER — Encounter: Payer: Self-pay | Admitting: Registered Nurse

## 2015-01-21 ENCOUNTER — Non-Acute Institutional Stay (SKILLED_NURSING_FACILITY): Payer: PPO | Admitting: Registered Nurse

## 2015-01-21 DIAGNOSIS — D72829 Elevated white blood cell count, unspecified: Secondary | ICD-10-CM | POA: Diagnosis not present

## 2015-01-21 DIAGNOSIS — G4700 Insomnia, unspecified: Secondary | ICD-10-CM

## 2015-01-21 DIAGNOSIS — D62 Acute posthemorrhagic anemia: Secondary | ICD-10-CM

## 2015-01-21 DIAGNOSIS — M1711 Unilateral primary osteoarthritis, right knee: Secondary | ICD-10-CM

## 2015-01-21 DIAGNOSIS — K5901 Slow transit constipation: Secondary | ICD-10-CM

## 2015-01-21 DIAGNOSIS — H8110 Benign paroxysmal vertigo, unspecified ear: Secondary | ICD-10-CM | POA: Diagnosis not present

## 2015-01-21 NOTE — Progress Notes (Signed)
Patient ID: Sandra Payne, female   DOB: 1941-01-11, 74 y.o.   MRN: [id number]   Place of Service: [facility]  Allergies  Allergen Reactions  . Codeine Itching  . Sulfa Antibiotics Other (See Comments)    Childhood allergy    Code Status: Full Code  Goals of Care: Longevity/STR  Chief Complaint  Patient presents with  . Discharge Note    HPI 74 y.o. female with PMH of OA, HLD, anemia, insomnia is being seen for a discharge visit. She was here for short term rehab post hospital admission from 01/06/15 to 01/08/15 with primary OA of right knee. She underwent right total knee arthroplasty on 01/06/15 without any complications. She has worked well with therapy team and is stable to discharge home with [facility] PT/OT. Seen in room today. Denies any concerns. Pain is adequately controlled with current regimen. No issues with constipation   Review of Systems Constitutional: Negative for fever, chills, and fatigue. HENT: Negative for ear pain, congestion, and sore throat Eyes: Negative for eye pain, eye discharge, and visual disturbance  Cardiovascular: Negative for chest pain, palpitations, and leg swelling Respiratory: Negative cough, shortness of breath, and wheezing.  Gastrointestinal: Negative for n/v, abdominal pain, diarrhea, constipation  Genitourinary: Negative for dysuria Endocrine: Negative for polydipsia, polyphagia, and polyuria Musculoskeletal: Negative for uncontrolled pain Neurological: Negative for dizziness and headache Skin: Negative for rash and itchiness.   Psychiatric: Negative for depression   Past Medical History  Diagnosis Date  . Elevated cholesterol   . Arthralgia   . Insomnia   . Arthritis   . Anemia     Past Surgical History  Procedure Laterality Date  . Abdominal hysterectomy      partial  . Total knee arthroplasty Right 01/06/2015    Procedure: RIGHT TOTAL KNEE ARTHROPLASTY;  Surgeon: Frederik Pear, MD;  Location: [place];  Service:  Orthopedics;  Laterality: Right;    History  Substance Use Topics  . Smoking status: Former Smoker -- 0.25 packs/day for 2 years    Types: Cigarettes  . Smokeless tobacco: Not on file  . Alcohol Use: 0.0 oz/week    0 Standard drinks or equivalent per week     Comment: occasionally    Family History  Problem Relation Age of Onset  . Diabetes Mother   . Hypertension Mother       Medication List       This list is accurate as of: 01/21/15 12:15 PM.  Always use your most recent med list.               docusate sodium 100 MG capsule  Commonly known as:  COLACE  Take 100 mg by mouth daily.     ferrous sulfate 325 (65 FE) MG tablet  Take 325 mg by mouth 2 (two) times daily with a meal.     meclizine 25 MG tablet  Commonly known as:  ANTIVERT  Take 1 tablet (25 mg total) by mouth 3 (three) times daily as needed for dizziness.     oxyCODONE-acetaminophen 5-325 MG per tablet  Commonly known as:  ROXICET  Take 1 tablet by mouth every 4 (four) hours as needed.     polyethylene glycol packet  Commonly known as:  MIRALAX / GLYCOLAX  Take 17 g by mouth daily as needed. Hold for diarrhea     promethazine 25 MG tablet  Commonly known as:  PHENERGAN  Take 25 mg by mouth every 8 (eight) hours as needed  for nausea or vomiting.     tizanidine 2 MG capsule  Commonly known as:  ZANAFLEX  Take 1 capsule (2 mg total) by mouth 3 (three) times daily.     traMADol 50 MG tablet  Commonly known as:  ULTRAM  Take 50 mg by mouth every 6 (six) hours as needed (pain).     zolpidem 10 MG tablet  Commonly known as:  AMBIEN  Take 5 mg by mouth at bedtime as needed for sleep.        Physical Exam  BP 130/64 mmHg  Pulse 95  Temp(Src) 97.9 F (36.6 C)  Resp 16  Constitutional: WDWN elderly female in no acute distress. Conversant and pleasant HEENT: Normocephalic and atraumatic. PERRL. EOM intact. No icterus. Oral mucosa moist. Posterior pharynx clear of any exudate or lesions.    Neck: Supple and nontender. No lymphadenopathy, masses, or thyromegaly. No JVD or carotid bruits. Cardiac: Normal S1, S2. RRR without appreciable murmurs, rubs, or gallops. Distal pulses intact. Trace dependent edema.  Respiratory: Unlabored respiration. Breath sounds clear bilaterally without rales, rhonchi, or wheezes. GI: Audible bowel sounds in all quadrants. Soft, nontender, nondistended. .  Musculoskeletal: able to move all extremities. Right knee surgical incision healing well. No signs of infection noted.  Skin: Warm and dry. Neurological: Alert and oriented to person, place, and time. No focal deficits.  Psychiatric: Judgment and insight adequate. Appropriate mood and affect.   Labs Reviewed  CBC Latest Ref Rng 01/16/2015 01/08/2015 01/07/2015  WBC - 11.4 11.4(H) 11.1(H)  Hemoglobin 12.0 - 16.0 g/dL 9.7(A) 9.5(L) 10.2(L)  Hematocrit 36 - 46 % 30(A) 30.8(L) 32.3(L)  Platelets 150 - 400 K/uL - 354 407(H)    CMP Latest Ref Rng 01/16/2015 01/07/2015 12/24/2014  Glucose 65 - 99 mg/dL - 137(H) 93  BUN 4 - 21 mg/dL 12 7 12   Creatinine 0.5 - 1.1 mg/dL 0.6 0.61 0.89  Sodium 137 - 147 mmol/L 137 138 143  Potassium 3.4 - 5.3 mmol/L 4.6 4.1 3.9  Chloride 101 - 111 mmol/L - 104 105  CO2 22 - 32 mmol/L - 27 30  Calcium 8.9 - 10.3 mg/dL - 8.6(L) 9.6   Diagnostic Studies Reviewed 12/24/14: EKG-NSR 5/316: CXR The lungs are clear and negative for focal airspace consolidation, pulmonary edema or suspicious pulmonary nodule. No pleural effusion or pneumothorax. Cardiac and mediastinal contours are within normal limits. No acute fracture or lytic or blastic osseous lesions. Multilevel degenerative disc disease. The visualized upper abdominal bowel gas pattern is unremarkable.  Assessment & Plan 1. Benign paroxysmal positional vertigo, unspecified laterality Stable. Continue meclizine 25mg  three times daily as needed for dizziness.   2. Primary osteoarthritis of right knee S/p right total knee  arthroplasty. Continue tramadol 50mg  every six hours as needed for pain and Percocet 5/325mg  1-2 tabs every 4 hours as needed for pain with zanaflex 2mg  three times daily for spasms. Continue to work with [facility] PT/OT for gait/strength/balance training to restore/maximize function. Fall risk precautions. Continue to f/u with ortho   3. Insomnia No issues. Continue ambien 5mg  nightly as needed for sleep  4. Slow transit constipation Stable. Continue colace 100mg  daily with miralax 17g daily as needed for constipation. Encourage hydration and ambulation as tolerated  5. Acute blood loss anemia Stable. Also has hx of anemia. Last hgb 9.7. Continue ferrous sulfate 325mg  twice daily and f/u with PCP. PCP to monitor h&h  6. Leukocytosis Most likely post op. Remains afebrile. VS stable. No signs of infection noted.  Continue to monitor for signs of potential infection  Home health services: PT/OT DME required: None (4-prong cane recommended) PCP follow-up: Please schedule f/u appt with PCP w/in 1-2 weeks of discharge from skilled nursing facility  30-day supply of prescription medications provided (#30 Percocet 5/325mg , #30 tramadol 50mg , #30 phenergan 25mg , #30 ambien 5mg )  Family/Staff Communication Plan of care discussed with patient and nursing staff. Patient and nursing staff verbalized understanding and agree with plan of care. No additional questions or concerns reported.    Arthur Holms, MSN, AGNP-C [facility] [address] [place], [place] [address] [phone] [8am-5pm] After hours: [phone]

## 2015-01-23 DIAGNOSIS — Z96651 Presence of right artificial knee joint: Secondary | ICD-10-CM

## 2015-01-23 DIAGNOSIS — Z471 Aftercare following joint replacement surgery: Secondary | ICD-10-CM | POA: Diagnosis not present

## 2015-01-23 DIAGNOSIS — M6281 Muscle weakness (generalized): Secondary | ICD-10-CM | POA: Diagnosis not present

## 2015-01-23 DIAGNOSIS — R269 Unspecified abnormalities of gait and mobility: Secondary | ICD-10-CM | POA: Diagnosis not present

## 2015-01-23 DIAGNOSIS — H8110 Benign paroxysmal vertigo, unspecified ear: Secondary | ICD-10-CM

## 2015-03-28 ENCOUNTER — Other Ambulatory Visit (HOSPITAL_COMMUNITY): Payer: Self-pay | Admitting: Family Medicine

## 2015-03-28 DIAGNOSIS — Z1231 Encounter for screening mammogram for malignant neoplasm of breast: Secondary | ICD-10-CM

## 2015-04-11 ENCOUNTER — Ambulatory Visit (HOSPITAL_COMMUNITY)
Admission: RE | Admit: 2015-04-11 | Discharge: 2015-04-11 | Disposition: A | Discharge status: Home / Self Care | Payer: PPO | Source: Ambulatory Visit | Attending: Family Medicine | Admitting: Family Medicine

## 2015-04-11 DIAGNOSIS — Z1231 Encounter for screening mammogram for malignant neoplasm of breast: Secondary | ICD-10-CM | POA: Insufficient documentation

## 2015-09-09 DIAGNOSIS — N390 Urinary tract infection, site not specified: Secondary | ICD-10-CM | POA: Diagnosis not present

## 2015-09-09 DIAGNOSIS — R300 Dysuria: Secondary | ICD-10-CM | POA: Diagnosis not present

## 2015-09-09 DIAGNOSIS — M1990 Unspecified osteoarthritis, unspecified site: Secondary | ICD-10-CM | POA: Diagnosis not present

## 2015-09-09 DIAGNOSIS — M25512 Pain in left shoulder: Secondary | ICD-10-CM | POA: Diagnosis not present

## 2015-10-07 DIAGNOSIS — M1712 Unilateral primary osteoarthritis, left knee: Secondary | ICD-10-CM | POA: Diagnosis not present

## 2015-10-07 DIAGNOSIS — M25561 Pain in right knee: Secondary | ICD-10-CM | POA: Diagnosis not present

## 2016-01-09 DIAGNOSIS — M25512 Pain in left shoulder: Secondary | ICD-10-CM | POA: Diagnosis not present

## 2016-03-19 ENCOUNTER — Other Ambulatory Visit: Payer: Self-pay | Admitting: Family Medicine

## 2016-03-19 DIAGNOSIS — Z1231 Encounter for screening mammogram for malignant neoplasm of breast: Secondary | ICD-10-CM

## 2016-03-29 DIAGNOSIS — M25562 Pain in left knee: Secondary | ICD-10-CM | POA: Diagnosis not present

## 2016-04-13 ENCOUNTER — Ambulatory Visit
Admission: RE | Admit: 2016-04-13 | Discharge: 2016-04-13 | Disposition: A | Discharge status: Home / Self Care | Payer: PPO | Source: Ambulatory Visit | Attending: Family Medicine | Admitting: Family Medicine

## 2016-04-13 ENCOUNTER — Ambulatory Visit: Payer: PPO

## 2016-04-13 DIAGNOSIS — Z1231 Encounter for screening mammogram for malignant neoplasm of breast: Secondary | ICD-10-CM

## 2016-05-04 DIAGNOSIS — K649 Unspecified hemorrhoids: Secondary | ICD-10-CM | POA: Diagnosis not present

## 2016-05-04 DIAGNOSIS — R300 Dysuria: Secondary | ICD-10-CM | POA: Diagnosis not present

## 2016-05-04 DIAGNOSIS — N390 Urinary tract infection, site not specified: Secondary | ICD-10-CM | POA: Diagnosis not present

## 2016-05-04 DIAGNOSIS — Z23 Encounter for immunization: Secondary | ICD-10-CM | POA: Diagnosis not present

## 2016-05-04 DIAGNOSIS — F4321 Adjustment disorder with depressed mood: Secondary | ICD-10-CM | POA: Diagnosis not present

## 2016-05-04 DIAGNOSIS — R51 Headache: Secondary | ICD-10-CM | POA: Diagnosis not present

## 2016-05-04 DIAGNOSIS — N898 Other specified noninflammatory disorders of vagina: Secondary | ICD-10-CM | POA: Diagnosis not present

## 2016-05-04 DIAGNOSIS — D649 Anemia, unspecified: Secondary | ICD-10-CM | POA: Diagnosis not present

## 2016-05-04 DIAGNOSIS — E782 Mixed hyperlipidemia: Secondary | ICD-10-CM | POA: Diagnosis not present

## 2016-05-07 LAB — GLUCOSE, POCT (MANUAL RESULT ENTRY): POC Glucose: 97 mg/dl (ref 70–99)

## 2016-05-08 DIAGNOSIS — R300 Dysuria: Secondary | ICD-10-CM | POA: Diagnosis not present

## 2016-05-08 DIAGNOSIS — N390 Urinary tract infection, site not specified: Secondary | ICD-10-CM | POA: Diagnosis not present

## 2016-05-11 DIAGNOSIS — K640 First degree hemorrhoids: Secondary | ICD-10-CM | POA: Diagnosis not present

## 2016-05-11 DIAGNOSIS — K5730 Diverticulosis of large intestine without perforation or abscess without bleeding: Secondary | ICD-10-CM | POA: Diagnosis not present

## 2016-05-11 DIAGNOSIS — Z1211 Encounter for screening for malignant neoplasm of colon: Secondary | ICD-10-CM | POA: Diagnosis not present

## 2016-06-03 DIAGNOSIS — R05 Cough: Secondary | ICD-10-CM | POA: Diagnosis not present

## 2016-06-03 DIAGNOSIS — M1712 Unilateral primary osteoarthritis, left knee: Secondary | ICD-10-CM | POA: Diagnosis not present

## 2016-06-04 ENCOUNTER — Other Ambulatory Visit: Payer: Self-pay | Admitting: Family Medicine

## 2016-06-04 ENCOUNTER — Ambulatory Visit
Admission: RE | Admit: 2016-06-04 | Discharge: 2016-06-04 | Disposition: A | Discharge status: Home / Self Care | Payer: PPO | Source: Ambulatory Visit | Attending: Family Medicine | Admitting: Family Medicine

## 2016-06-04 DIAGNOSIS — R059 Cough, unspecified: Secondary | ICD-10-CM

## 2016-06-04 DIAGNOSIS — R05 Cough: Secondary | ICD-10-CM

## 2016-06-10 DIAGNOSIS — M1712 Unilateral primary osteoarthritis, left knee: Secondary | ICD-10-CM | POA: Diagnosis not present

## 2016-06-17 DIAGNOSIS — M1712 Unilateral primary osteoarthritis, left knee: Secondary | ICD-10-CM | POA: Diagnosis not present

## 2016-06-24 DIAGNOSIS — M1711 Unilateral primary osteoarthritis, right knee: Secondary | ICD-10-CM | POA: Diagnosis not present

## 2016-06-24 DIAGNOSIS — M1712 Unilateral primary osteoarthritis, left knee: Secondary | ICD-10-CM | POA: Diagnosis not present

## 2016-07-01 DIAGNOSIS — M1712 Unilateral primary osteoarthritis, left knee: Secondary | ICD-10-CM | POA: Diagnosis not present

## 2016-07-13 DIAGNOSIS — E782 Mixed hyperlipidemia: Secondary | ICD-10-CM | POA: Diagnosis not present

## 2016-07-13 DIAGNOSIS — Z79899 Other long term (current) drug therapy: Secondary | ICD-10-CM | POA: Diagnosis not present

## 2016-07-13 DIAGNOSIS — D509 Iron deficiency anemia, unspecified: Secondary | ICD-10-CM | POA: Diagnosis not present

## 2016-07-13 DIAGNOSIS — E559 Vitamin D deficiency, unspecified: Secondary | ICD-10-CM | POA: Diagnosis not present

## 2016-07-14 DIAGNOSIS — Z0000 Encounter for general adult medical examination without abnormal findings: Secondary | ICD-10-CM | POA: Diagnosis not present

## 2016-07-14 DIAGNOSIS — D692 Other nonthrombocytopenic purpura: Secondary | ICD-10-CM | POA: Diagnosis not present

## 2016-07-14 DIAGNOSIS — G4700 Insomnia, unspecified: Secondary | ICD-10-CM | POA: Diagnosis not present

## 2016-07-14 DIAGNOSIS — J309 Allergic rhinitis, unspecified: Secondary | ICD-10-CM | POA: Diagnosis not present

## 2016-07-14 DIAGNOSIS — E785 Hyperlipidemia, unspecified: Secondary | ICD-10-CM | POA: Diagnosis not present

## 2016-09-21 DIAGNOSIS — J111 Influenza due to unidentified influenza virus with other respiratory manifestations: Secondary | ICD-10-CM | POA: Diagnosis not present

## 2016-09-21 DIAGNOSIS — R3989 Other symptoms and signs involving the genitourinary system: Secondary | ICD-10-CM | POA: Diagnosis not present

## 2016-09-21 DIAGNOSIS — N390 Urinary tract infection, site not specified: Secondary | ICD-10-CM | POA: Diagnosis not present

## 2016-11-08 DIAGNOSIS — R1031 Right lower quadrant pain: Secondary | ICD-10-CM | POA: Diagnosis not present

## 2016-11-09 ENCOUNTER — Other Ambulatory Visit: Payer: Self-pay | Admitting: Family Medicine

## 2016-11-09 DIAGNOSIS — R1031 Right lower quadrant pain: Secondary | ICD-10-CM

## 2016-11-11 ENCOUNTER — Ambulatory Visit
Admission: RE | Admit: 2016-11-11 | Discharge: 2016-11-11 | Disposition: A | Discharge status: Home / Self Care | Payer: PPO | Source: Ambulatory Visit | Attending: Family Medicine | Admitting: Family Medicine

## 2016-11-11 DIAGNOSIS — R1031 Right lower quadrant pain: Secondary | ICD-10-CM | POA: Diagnosis not present

## 2016-11-11 MED ORDER — IOPAMIDOL (ISOVUE-300) INJECTION 61%
100.0000 mL | Freq: Once | INTRAVENOUS | Status: AC | PRN
  Administered 2016-11-11: 100 mL via INTRAVENOUS

## 2017-01-07 DIAGNOSIS — J45909 Unspecified asthma, uncomplicated: Secondary | ICD-10-CM | POA: Diagnosis not present

## 2017-01-13 ENCOUNTER — Ambulatory Visit (HOSPITAL_COMMUNITY)
Admission: EM | Admit: 2017-01-13 | Discharge: 2017-01-13 | Disposition: A | Discharge status: Home / Self Care | Payer: PPO | Attending: Internal Medicine | Admitting: Internal Medicine

## 2017-01-13 ENCOUNTER — Ambulatory Visit (INDEPENDENT_AMBULATORY_CARE_PROVIDER_SITE_OTHER): Payer: PPO

## 2017-01-13 ENCOUNTER — Encounter (HOSPITAL_COMMUNITY): Payer: Self-pay | Admitting: Emergency Medicine

## 2017-01-13 DIAGNOSIS — S20219A Contusion of unspecified front wall of thorax, initial encounter: Secondary | ICD-10-CM | POA: Diagnosis not present

## 2017-01-13 DIAGNOSIS — R079 Chest pain, unspecified: Secondary | ICD-10-CM | POA: Diagnosis not present

## 2017-01-13 MED ORDER — ACETAMINOPHEN 325 MG PO TABS
975.0000 mg | ORAL_TABLET | Freq: Once | ORAL | Status: AC
  Administered 2017-01-13: 975 mg via ORAL

## 2017-01-13 MED ORDER — ACETAMINOPHEN 325 MG PO TABS
ORAL_TABLET | ORAL | Status: AC
  Filled 2017-01-13: qty 3

## 2017-01-13 NOTE — Discharge Instructions (Addendum)
Anticipate gradual improvement in chest discomfort over the next 10-14 days.  May feel worse over the next day or two before starting to improve.   Ice for 5-10 minutes several times daily will help with discomfort.  Tylenol should also be helpful.  Limit activities that increase pain for the next several days.   Wash scratch on right arm gently with soap/water 1-2 times daily, apply antibiotic ointment.  Recheck if any increasing redness/swelling/pain/drainage.   Chest xray at the urgent care was negative for bony injury/fracture.   Recheck for new fever >100.5 or cough, breathlessness, or if not starting to improve in a few days.

## 2017-01-13 NOTE — ED Triage Notes (Signed)
Tripped over her dog on patio, next to a brick wall.  Landed on brick wall with chest and right forearm.  Abrasions to right forearm.  Soreness across chest, no visible bruising to this area

## 2017-01-13 NOTE — ED Provider Notes (Signed)
[place]    CSN: [id number] Arrival date & time: 01/13/17  1944     History   Chief Complaint Chief Complaint  Patient presents with  . Fall    HPI Sandra Payne is a 76 y.o. female. She tripped over her little dog today, and fell forward landing with her upper chest across the patio wall. She has discomfort in her upper breasts and chest to palpation and with deep breath. No palpitations. Not short of breath. Able to move arms and shoulders elbows and wrists freely. Has a scratch, superficial, along most of her ulnar forearm.    HPI  Past Medical History:  Diagnosis Date  . Anemia   . Arthralgia   . Arthritis   . Elevated cholesterol   . Insomnia     Patient Active Problem List   Diagnosis Date Noted  . Arthritis of knee 01/06/2015  . Primary osteoarthritis of right knee 01/05/2015  . Abnormality of gait 12/03/2014  . Benign paroxysmal positional vertigo 12/03/2014  . Paresthesias 12/03/2014    Past Surgical History:  Procedure Laterality Date  . ABDOMINAL HYSTERECTOMY     partial  . TOTAL KNEE ARTHROPLASTY Right 01/06/2015   Procedure: RIGHT TOTAL KNEE ARTHROPLASTY;  Surgeon: Frederik Pear, MD;  Location: [place];  Service: Orthopedics;  Laterality: Right;    Home Medications    Prior to Admission medications   Medication Sig Start Date End Date Taking? Authorizing Provider  acetaminophen (TYLENOL) 325 MG tablet Take 650 mg by mouth every 6 (six) hours as needed.   Yes [provider]  Celecoxib (CELEBREX PO) Take by mouth.   Yes [provider]  docusate sodium (COLACE) 100 MG capsule Take 100 mg by mouth daily.     [provider]  ferrous sulfate 325 (65 FE) MG tablet Take 325 mg by mouth 2 (two) times daily with a meal.    [provider]  meclizine (ANTIVERT) 25 MG tablet Take 1 tablet (25 mg total) by mouth 3 (three) times daily as needed for dizziness. 12/03/14   Patel, Arvin Collard K, DO    oxyCODONE-acetaminophen (ROXICET) 5-325 MG per tablet Take 1 tablet by mouth every 4 (four) hours as needed. Patient taking differently: Take 1-2 tablets by mouth every 4 (four) hours as needed.  01/06/15   Leighton Parody, PA-C  polyethylene glycol [facility] / GLYCOLAX) packet Take 17 g by mouth daily as needed. Hold for diarrhea    [provider]  promethazine (PHENERGAN) 25 MG tablet Take 25 mg by mouth every 8 (eight) hours as needed for nausea or vomiting.    [provider]  tizanidine (ZANAFLEX) 2 MG capsule Take 1 capsule (2 mg total) by mouth 3 (three) times daily. 01/06/15   Leighton Parody, PA-C  traMADol (ULTRAM) 50 MG tablet Take 50 mg by mouth every 6 (six) hours as needed (pain).     [provider]  zolpidem (AMBIEN) 10 MG tablet Take 5 mg by mouth at bedtime as needed for sleep.    [provider]    Family History Family History  Problem Relation Age of Onset  . Diabetes Mother   . Hypertension Mother     Social History Social History  Substance Use Topics  . Smoking status: Former Smoker    Packs/day: 0.25    Years: 2.00    Types: Cigarettes  . Smokeless tobacco: Not on file  . Alcohol use 0.0 oz/week  Comment: occasionally     Allergies   Codeine and Sulfa antibiotics   Review of Systems Review of Systems  All other systems reviewed and are negative.    Physical Exam Triage Vital Signs ED Triage Vitals  Enc Vitals Group     BP 01/13/17 2019 137/88     Pulse Rate 01/13/17 2019 82     Resp 01/13/17 2019 18     Temp 01/13/17 2019 98.1 F (36.7 C)     Temp Source 01/13/17 2019 Oral     SpO2 01/13/17 2019 96 %     Weight --      Height --      Pain Score 01/13/17 2017 8     Pain Loc --    Updated Vital Signs BP 137/88 (BP Location: Left Arm)   Pulse 82   Temp 98.1 F (36.7 C) (Oral)   Resp 18   SpO2 96%   Physical Exam  Constitutional: She is oriented to person, place, and time. No distress.   HENT:  Head: Atraumatic.  Eyes:  Conjugate gaze observed, no eye redness/discharge  Neck: Neck supple.  Cardiovascular: Normal rate and regular rhythm.   Pulmonary/Chest: Effort normal. No respiratory distress. She has no wheezes. She has no rales.  Lungs clear, symmetric breath sounds. Tender across the upper sternum and the costochondral junctions bilaterally. No chest bruise.  Abdominal: She exhibits no distension.  Musculoskeletal: Normal range of motion.  Full and symmetric range of motion of bilateral shoulders, able to extend fully overhead and internally and externally rotate. Full and symmetric range of motion of both elbows including flexion and extension, pronation and supination of both wrists. Wrist rotation and flexion/extension are full and symmetric. Able to make a fist bilaterally and open fingers widely. Superficial scratch evident along most of the ulnar aspect of the right forearm. Not bruised or swollen. No focal tenderness.  Neurological: She is alert and oriented to person, place, and time.  Walked into the urgent care independently. Face is symmetric, speech is clear and coherent.  Skin: Skin is warm and dry.  Nursing note and vitals reviewed.    UC Treatments / Results   Radiology Dg Chest 2 View  Result Date: 01/13/2017 CLINICAL DATA:  Initial evaluation for acute upper chest pain status post fall. EXAM: CHEST  2 VIEW COMPARISON:  Prior radiograph from 06/04/2016. FINDINGS: The cardiac and mediastinal silhouettes are stable in size and contour, and remain within normal limits. The lungs are normally inflated. No airspace consolidation, pleural effusion, or pulmonary edema is identified. There is no pneumothorax. No acute osseous abnormality identified. IMPRESSION: No active cardiopulmonary disease. Electronically Signed   By: Jeannine Boga M.D.   On: 01/13/2017 21:22    Procedures Procedures (including critical care time)  Medications Ordered in  UC Medications  acetaminophen (TYLENOL) tablet 975 mg (975 mg Oral Given 01/13/17 2122)     Final Clinical Impressions(s) / UC Diagnoses   Final diagnoses:  Contusion of chest wall, initial encounter   Anticipate gradual improvement in chest discomfort over the next 10-14 days.  May feel worse over the next day or two before starting to improve.   Ice for 5-10 minutes several times daily will help with discomfort.  Tylenol should also be helpful.  Limit activities that increase pain for the next several days.   Wash scratch on right arm gently with soap/water 1-2 times daily, apply antibiotic ointment.  Recheck if any increasing redness/swelling/pain/drainage.   Chest xray  at the urgent care was negative for bony injury/fracture.   Recheck for new fever >100.5 or cough, breathlessness, or if not starting to improve in a few days.     Sherlene Shams, MD 01/14/17 [phone]

## 2017-01-27 DIAGNOSIS — S20219A Contusion of unspecified front wall of thorax, initial encounter: Secondary | ICD-10-CM | POA: Diagnosis not present

## 2017-01-27 DIAGNOSIS — G4700 Insomnia, unspecified: Secondary | ICD-10-CM | POA: Diagnosis not present

## 2017-03-14 ENCOUNTER — Other Ambulatory Visit: Payer: Self-pay | Admitting: Family Medicine

## 2017-03-14 DIAGNOSIS — Z1231 Encounter for screening mammogram for malignant neoplasm of breast: Secondary | ICD-10-CM

## 2017-03-17 DIAGNOSIS — J309 Allergic rhinitis, unspecified: Secondary | ICD-10-CM | POA: Diagnosis not present

## 2017-04-14 ENCOUNTER — Ambulatory Visit
Admission: RE | Admit: 2017-04-14 | Discharge: 2017-04-14 | Disposition: A | Discharge status: Home / Self Care | Payer: PPO | Source: Ambulatory Visit | Attending: Family Medicine | Admitting: Family Medicine

## 2017-04-14 DIAGNOSIS — Z1231 Encounter for screening mammogram for malignant neoplasm of breast: Secondary | ICD-10-CM | POA: Diagnosis not present

## 2017-06-09 DIAGNOSIS — Z23 Encounter for immunization: Secondary | ICD-10-CM | POA: Diagnosis not present

## 2017-06-21 DIAGNOSIS — N390 Urinary tract infection, site not specified: Secondary | ICD-10-CM | POA: Diagnosis not present

## 2017-08-11 DIAGNOSIS — E785 Hyperlipidemia, unspecified: Secondary | ICD-10-CM | POA: Diagnosis not present

## 2017-08-11 DIAGNOSIS — E559 Vitamin D deficiency, unspecified: Secondary | ICD-10-CM | POA: Diagnosis not present

## 2017-08-11 DIAGNOSIS — Z79899 Other long term (current) drug therapy: Secondary | ICD-10-CM | POA: Diagnosis not present

## 2017-08-11 DIAGNOSIS — D509 Iron deficiency anemia, unspecified: Secondary | ICD-10-CM | POA: Diagnosis not present

## 2017-08-12 DIAGNOSIS — D692 Other nonthrombocytopenic purpura: Secondary | ICD-10-CM | POA: Diagnosis not present

## 2017-08-12 DIAGNOSIS — E782 Mixed hyperlipidemia: Secondary | ICD-10-CM | POA: Diagnosis not present

## 2017-08-12 DIAGNOSIS — G4700 Insomnia, unspecified: Secondary | ICD-10-CM | POA: Diagnosis not present

## 2017-08-12 DIAGNOSIS — Z1389 Encounter for screening for other disorder: Secondary | ICD-10-CM | POA: Diagnosis not present

## 2017-08-12 DIAGNOSIS — E2839 Other primary ovarian failure: Secondary | ICD-10-CM | POA: Diagnosis not present

## 2017-08-12 DIAGNOSIS — Z0000 Encounter for general adult medical examination without abnormal findings: Secondary | ICD-10-CM | POA: Diagnosis not present

## 2017-08-12 DIAGNOSIS — M1990 Unspecified osteoarthritis, unspecified site: Secondary | ICD-10-CM | POA: Diagnosis not present

## 2017-08-23 DIAGNOSIS — J209 Acute bronchitis, unspecified: Secondary | ICD-10-CM | POA: Diagnosis not present

## 2017-08-28 ENCOUNTER — Encounter (HOSPITAL_COMMUNITY): Payer: Self-pay | Admitting: Emergency Medicine

## 2017-08-28 ENCOUNTER — Ambulatory Visit (INDEPENDENT_AMBULATORY_CARE_PROVIDER_SITE_OTHER): Payer: PPO

## 2017-08-28 ENCOUNTER — Ambulatory Visit (HOSPITAL_COMMUNITY)
Admission: EM | Admit: 2017-08-28 | Discharge: 2017-08-28 | Disposition: A | Discharge status: Home / Self Care | Payer: PPO | Attending: Internal Medicine | Admitting: Internal Medicine

## 2017-08-28 DIAGNOSIS — J22 Unspecified acute lower respiratory infection: Secondary | ICD-10-CM | POA: Diagnosis not present

## 2017-08-28 DIAGNOSIS — R0602 Shortness of breath: Secondary | ICD-10-CM | POA: Diagnosis not present

## 2017-08-28 DIAGNOSIS — R05 Cough: Secondary | ICD-10-CM | POA: Diagnosis not present

## 2017-08-28 MED ORDER — PREDNISONE 20 MG PO TABS: 40.0000 mg | ORAL_TABLET | Freq: Every day | ORAL | 0 refills | Status: AC

## 2017-08-28 MED ORDER — AZITHROMYCIN 250 MG PO TABS: ORAL_TABLET | ORAL | 0 refills | Status: DC

## 2017-08-28 NOTE — ED Provider Notes (Signed)
[place]    CSN: [id number] Arrival date & time: 08/28/17  1226     History   Chief Complaint Chief Complaint  Patient presents with  . URI    HPI Sandra Payne is a 77 y.o. female.   Sandra Payne presents with complaints of persistent cough for at least 1 week. It causes shortness of breath, wheezing, soreness to chest and headache with coughing. States temps have been in 99's, without high fevers. She is congested with nasal drainage production, cough is productive. Without ear or throat pain. Denise gi/gu complaints. Saw provider at a walk in clinic on 1/1 and was given an inhaler and cough syrup, these have minimally helped. Without history of asthma. No known ill contacts. Has also taken night time combination cold medication. Ambulatory, without weakness.    ROS per HPI.       Past Medical History:  Diagnosis Date  . Anemia   . Arthralgia   . Arthritis   . Elevated cholesterol   . Insomnia     Patient Active Problem List   Diagnosis Date Noted  . Arthritis of knee 01/06/2015  . Primary osteoarthritis of right knee 01/05/2015  . Abnormality of gait 12/03/2014  . Benign paroxysmal positional vertigo 12/03/2014  . Paresthesias 12/03/2014    Past Surgical History:  Procedure Laterality Date  . ABDOMINAL HYSTERECTOMY     partial  . TOTAL KNEE ARTHROPLASTY Right 01/06/2015   Procedure: RIGHT TOTAL KNEE ARTHROPLASTY;  Surgeon: Frederik Pear, MD;  Location: [place];  Service: Orthopedics;  Laterality: Right;    OB History    No data available       Home Medications    Prior to Admission medications   Medication Sig Start Date End Date Taking? Authorizing Provider  Celecoxib (CELEBREX PO) Take by mouth.   Yes [provider]  ferrous sulfate 325 (65 FE) MG tablet Take 325 mg by mouth 2 (two) times daily with a meal.   Yes [provider]  acetaminophen (TYLENOL) 325 MG tablet Take 650 mg by mouth every 6 (six) hours as needed.     [provider]  azithromycin (ZITHROMAX) 250 MG tablet Take first 2 tablets together, then 1 every day until finished. 08/28/17   Zigmund Gottron, NP  docusate sodium (COLACE) 100 MG capsule Take 100 mg by mouth daily.     [provider]  meclizine (ANTIVERT) 25 MG tablet Take 1 tablet (25 mg total) by mouth 3 (three) times daily as needed for dizziness. 12/03/14   Patel, Arvin Collard K, DO  oxyCODONE-acetaminophen (ROXICET) 5-325 MG per tablet Take 1 tablet by mouth every 4 (four) hours as needed. Patient taking differently: Take 1-2 tablets by mouth every 4 (four) hours as needed.  01/06/15   Leighton Parody, PA-C  polyethylene glycol [facility] / GLYCOLAX) packet Take 17 g by mouth daily as needed. Hold for diarrhea    [provider]  promethazine (PHENERGAN) 25 MG tablet Take 25 mg by mouth every 8 (eight) hours as needed for nausea or vomiting.    [provider]  tizanidine (ZANAFLEX) 2 MG capsule Take 1 capsule (2 mg total) by mouth 3 (three) times daily. 01/06/15   Leighton Parody, PA-C  traMADol (ULTRAM) 50 MG tablet Take 50 mg by mouth every 6 (six) hours as needed (pain).     [provider]  zolpidem (AMBIEN) 10 MG tablet Take 5 mg by mouth at bedtime as needed for sleep.  [provider]    Family History Family History  Problem Relation Age of Onset  . Diabetes Mother   . Hypertension Mother   . Breast cancer Neg Hx     Social History Social History   Tobacco Use  . Smoking status: Former Smoker    Packs/day: 0.25    Years: 2.00    Pack years: 0.50    Types: Cigarettes  . Smokeless tobacco: Never Used  Substance Use Topics  . Alcohol use: Yes    Alcohol/week: 0.0 oz    Comment: occasionally  . Drug use: No     Allergies   Codeine and Sulfa antibiotics   Review of Systems Review of Systems   Physical Exam Triage Vital Signs ED Triage Vitals [08/28/17 1357]  Enc Vitals Group     BP (!) 141/83     Pulse  Rate 89     Resp 16     Temp 98.5 F (36.9 C)     Temp Source Oral     SpO2 99 %     Weight      Height      Head Circumference      Peak Flow      Pain Score      Pain Loc      Pain Edu?      Excl. in [place]?    No data found.  Updated Vital Signs BP (!) 141/83 (BP Location: Left Arm)   Pulse 89   Temp 98.5 F (36.9 C) (Oral)   Resp 16   SpO2 99%   Visual Acuity Right Eye Distance:   Left Eye Distance:   Bilateral Distance:    Right Eye Near:   Left Eye Near:    Bilateral Near:     Physical Exam  Constitutional: She is oriented to person, place, and time. She appears well-developed and well-nourished. No distress.  HENT:  Head: Normocephalic and atraumatic.  Right Ear: Tympanic membrane, external ear and ear canal normal.  Left Ear: Tympanic membrane, external ear and ear canal normal.  Nose: Nose normal.  Mouth/Throat: Uvula is midline, oropharynx is clear and moist and mucous membranes are normal. No tonsillar exudate.  Eyes: Conjunctivae and EOM are normal. Pupils are equal, round, and reactive to light.  Cardiovascular: Normal rate, regular rhythm and normal heart sounds.  Pulmonary/Chest: Effort normal. No accessory muscle usage. No respiratory distress. She has rhonchi.  Scattered rhonchi throughout to bilateral lungs  Neurological: She is alert and oriented to person, place, and time.  Skin: Skin is warm and dry.     UC Treatments / Results  Labs (all labs ordered are listed, but only abnormal results are displayed) Labs Reviewed - No data to display  EKG  EKG Interpretation None       Radiology Dg Chest 2 View  Result Date: 08/28/2017 CLINICAL DATA:  Cough and shortness of breath for 1 week. EXAM: CHEST  2 VIEW COMPARISON:  01/13/2017 prior exams FINDINGS: The cardiomediastinal silhouette is unremarkable. Mild peribronchial thickening again noted. There is no evidence of focal airspace disease, pulmonary edema, suspicious pulmonary nodule/mass,  pleural effusion, or pneumothorax. No acute bony abnormalities are identified. IMPRESSION: No active cardiopulmonary disease. Electronically Signed   By: Margarette Canada M.D.   On: 08/28/2017 14:51    Procedures Procedures (including critical care time)  Medications Ordered in UC Medications - No data to display   Initial Impression / Assessment and Plan / UC Course  I  have reviewed the triage vital signs and the nursing notes.  Pertinent labs & imaging results that were available during my care of the patient were reviewed by me and considered in my medical decision making (see chart for details).     Chest xray reassuring. Azithromycin initiated due to lung sounds and physical exam at this time. Push fluids. supportive cares for symptoms. Recheck with PCP in the next week. Return precautions provided. Patient verbalized understanding and agreeable to plan.    Final Clinical Impressions(s) / UC Diagnoses   Final diagnoses:  Lower respiratory tract infection    ED Discharge Orders        Ordered    azithromycin (ZITHROMAX) 250 MG tablet     08/28/17 1509       Controlled Substance Prescriptions [place] Controlled Substance Registry consulted? Not Applicable   Zigmund Gottron, NP 08/28/17 1512

## 2017-08-28 NOTE — ED Triage Notes (Signed)
PT C/O: cold sx onset 1 week associated w/prod cough, SOB due to cough, chest/rib pain due to cough, wheezing  DENIES: fevers  TAKING MEDS: Rescue inhaler  A&O x4... NAD... Ambulatory

## 2017-08-28 NOTE — Discharge Instructions (Signed)
Push fluids to ensure adequate hydration and keep secretions thin.  Complete course of antibiotics. Tylenol and/or ibuprofen as needed for pain or fevers.  Continue with use of cough syrup and/or inhaler as needed for symptoms. Please follow up with your primary care provider for recheck in the next 5 days. Return sooner or go to ER if your symptoms worsen, become more short of breath, develop chest pain, weakness, fevers or dizziness.

## 2017-10-13 DIAGNOSIS — E2839 Other primary ovarian failure: Secondary | ICD-10-CM | POA: Diagnosis not present

## 2018-01-20 DIAGNOSIS — Z23 Encounter for immunization: Secondary | ICD-10-CM | POA: Diagnosis not present

## 2018-01-20 DIAGNOSIS — R05 Cough: Secondary | ICD-10-CM | POA: Diagnosis not present

## 2018-01-20 DIAGNOSIS — M1990 Unspecified osteoarthritis, unspecified site: Secondary | ICD-10-CM | POA: Diagnosis not present

## 2018-03-16 ENCOUNTER — Other Ambulatory Visit: Payer: Self-pay | Admitting: Family Medicine

## 2018-03-16 DIAGNOSIS — Z1231 Encounter for screening mammogram for malignant neoplasm of breast: Secondary | ICD-10-CM

## 2018-03-22 DIAGNOSIS — J069 Acute upper respiratory infection, unspecified: Secondary | ICD-10-CM | POA: Diagnosis not present

## 2018-03-27 DIAGNOSIS — N898 Other specified noninflammatory disorders of vagina: Secondary | ICD-10-CM | POA: Diagnosis not present

## 2018-03-27 DIAGNOSIS — R05 Cough: Secondary | ICD-10-CM | POA: Diagnosis not present

## 2018-03-27 DIAGNOSIS — H1030 Unspecified acute conjunctivitis, unspecified eye: Secondary | ICD-10-CM | POA: Diagnosis not present

## 2018-04-17 ENCOUNTER — Ambulatory Visit
Admission: RE | Admit: 2018-04-17 | Discharge: 2018-04-17 | Disposition: A | Discharge status: Home / Self Care | Payer: PPO | Source: Ambulatory Visit | Attending: Family Medicine | Admitting: Family Medicine

## 2018-04-17 DIAGNOSIS — Z1231 Encounter for screening mammogram for malignant neoplasm of breast: Secondary | ICD-10-CM

## 2018-06-05 DIAGNOSIS — Z23 Encounter for immunization: Secondary | ICD-10-CM | POA: Diagnosis not present

## 2018-06-29 DIAGNOSIS — R202 Paresthesia of skin: Secondary | ICD-10-CM | POA: Diagnosis not present

## 2018-06-29 DIAGNOSIS — G4700 Insomnia, unspecified: Secondary | ICD-10-CM | POA: Diagnosis not present

## 2018-06-29 DIAGNOSIS — M1990 Unspecified osteoarthritis, unspecified site: Secondary | ICD-10-CM | POA: Diagnosis not present

## 2018-06-29 DIAGNOSIS — R05 Cough: Secondary | ICD-10-CM | POA: Diagnosis not present

## 2018-07-03 ENCOUNTER — Encounter: Payer: Self-pay | Admitting: Neurology

## 2018-09-12 DIAGNOSIS — D509 Iron deficiency anemia, unspecified: Secondary | ICD-10-CM | POA: Diagnosis not present

## 2018-09-12 DIAGNOSIS — E782 Mixed hyperlipidemia: Secondary | ICD-10-CM | POA: Diagnosis not present

## 2018-09-12 DIAGNOSIS — E559 Vitamin D deficiency, unspecified: Secondary | ICD-10-CM | POA: Diagnosis not present

## 2018-09-12 DIAGNOSIS — Z79899 Other long term (current) drug therapy: Secondary | ICD-10-CM | POA: Diagnosis not present

## 2018-09-14 DIAGNOSIS — Z0000 Encounter for general adult medical examination without abnormal findings: Secondary | ICD-10-CM | POA: Diagnosis not present

## 2018-09-14 DIAGNOSIS — E559 Vitamin D deficiency, unspecified: Secondary | ICD-10-CM | POA: Diagnosis not present

## 2018-09-14 DIAGNOSIS — E785 Hyperlipidemia, unspecified: Secondary | ICD-10-CM | POA: Diagnosis not present

## 2018-09-14 DIAGNOSIS — N390 Urinary tract infection, site not specified: Secondary | ICD-10-CM | POA: Diagnosis not present

## 2018-09-14 DIAGNOSIS — R141 Gas pain: Secondary | ICD-10-CM | POA: Diagnosis not present

## 2018-09-14 DIAGNOSIS — D649 Anemia, unspecified: Secondary | ICD-10-CM | POA: Diagnosis not present

## 2018-09-18 ENCOUNTER — Encounter: Payer: Self-pay | Admitting: Neurology

## 2018-09-18 ENCOUNTER — Ambulatory Visit (INDEPENDENT_AMBULATORY_CARE_PROVIDER_SITE_OTHER): Payer: PPO | Admitting: Neurology

## 2018-09-18 VITALS — BP 110/68 | HR 99 | Ht <= 58 in | Wt 137.0 lb

## 2018-09-18 DIAGNOSIS — R202 Paresthesia of skin: Secondary | ICD-10-CM

## 2018-09-18 NOTE — Patient Instructions (Signed)
We will order nerve testing of the left hand.  Please do not apply lotion on your hand/arm on the day of testing.  ELECTROMYOGRAM AND NERVE CONDUCTION STUDIES (EMG/NCS) INSTRUCTIONS  How to Prepare The neurologist conducting the EMG will need to know if you have certain medical conditions. Tell the neurologist and other EMG lab personnel if you: . Have a pacemaker or any other electrical medical device . Take blood-thinning medications . Have hemophilia, a blood-clotting disorder that causes prolonged bleeding Bathing Take a shower or bath shortly before your exam in order to remove oils from your skin. Don't apply lotions or creams before the exam.  What to Expect You'll likely be asked to change into a hospital gown for the procedure and lie down on an examination table. The following explanations can help you understand what will happen during the exam.  . Electrodes. The neurologist or a technician places surface electrodes at various locations on your skin depending on where you're experiencing symptoms. Or the neurologist may insert needle electrodes at different sites depending on your symptoms.  . Sensations. The electrodes will at times transmit a tiny electrical current that you may feel as a twinge or spasm. The needle electrode may cause discomfort or pain that usually ends shortly after the needle is removed. If you are concerned about discomfort or pain, you may want to talk to the neurologist about taking a short break during the exam.  . Instructions. During the needle EMG, the neurologist will assess whether there is any spontaneous electrical activity when the muscle is at rest - activity that isn't present in healthy muscle tissue - and the degree of activity when you slightly contract the muscle.  He or she will give you instructions on resting and contracting a muscle at appropriate times. Depending on what muscles and nerves the neurologist is examining, he or she may ask you  to change positions during the exam.  After your EMG You may experience some temporary, minor bruising where the needle electrode was inserted into your muscle. This bruising should fade within several days. If it persists, contact your primary care doctor.

## 2018-09-18 NOTE — Progress Notes (Signed)
[facility] Clinic Note - Initial Visit   Date: 09/18/18  JASMYN PICHA MRN: [id number] DOB: Apr 18, 1941   Dear Dr. Stephanie Acre:  Thank you for your kind referral of Sandra Payne for consultation of left hand tingling. Although her history is well known to you, please allow [place] to reiterate it for the purpose of our medical record. The patient was accompanied to the clinic by self.   History of Present Illness: Sandra Payne is a 78 y.o. right-handed African American female with hyperlipidemia, insomnia, and arthralgias presenting for evaluation of left hand numbness.    Starting around 2015, she began having tingling of the fingers on the left hand.  Symptoms are have become constant.  No specific exacerbating factors.  She does not wake up at night time with her hands falling asleep.  She has noticed that sometimes, she may drop objects from the left hand, such as her medications. She does not have similar problems on the right hand.  She does not have radicular arm pain. 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  She was last seen here in 2016 for the same complaints.  At that visit, I recommended NCS/EMG which was declined due to intermittent symptoms.  Out-side paper records, electronic medical record, and images have been reviewed where available and summarized as:  MRI lumbar spine without contrast 09/18/2013: 1. 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 This may contribute to right L5 root encroachment. 2. Mild multifactorial spinal stenosis at L3-4 with mild narrowing of the lateral recesses and foramina bilaterally. 3. Mild by foraminal stenosis at L5-S1. 4. Mild spinal stenosis at T11-12.  XR cervical spine 08/31/2012: Extensive spondylosis and osteoarthritis. No fracture or spondylolisthesis. Past Medical  History:  Diagnosis Date  . Anemia   . Arthralgia   . Arthritis   . Elevated cholesterol   . Insomnia     Past Surgical History:  Procedure Laterality Date  . ABDOMINAL HYSTERECTOMY     partial  . TOTAL KNEE ARTHROPLASTY Right 01/06/2015   Procedure: RIGHT TOTAL KNEE ARTHROPLASTY;  Surgeon: Frederik Pear, MD;  Location: [place];  Service: Orthopedics;  Laterality: Right;     Medications:  Outpatient Encounter Medications as of 09/18/2018  Medication Sig  . acetaminophen (TYLENOL) 325 MG tablet Take 650 mg by mouth every 6 (six) hours as needed.  [name] Kitchen azithromycin (ZITHROMAX) 250 MG tablet Take first 2 tablets together, then 1 every day until finished.  . Celecoxib (CELEBREX PO) Take by mouth.  . ferrous sulfate 325 (65 FE) MG tablet Take 325 mg by mouth 2 (two) times daily with a meal.   No facility-administered encounter medications on file as of 09/18/2018.      Allergies:  Allergies  Allergen Reactions  . Codeine Itching  . Sulfa Antibiotics Other (See Comments)    Childhood allergy    Family History: Family History  Problem Relation Age of Onset  . Diabetes Mother   . Hypertension Mother   . Breast cancer Neg Hx     Social History: Social History   Tobacco Use  . Smoking status: Former Smoker    Packs/day: 0.25    Years: 2.00    Pack years: 0.50    Types: Cigarettes  . Smokeless tobacco: Never Used  Substance Use Topics  . Alcohol use: Yes    Alcohol/week: 0.0 standard drinks  Comment: occasionally  . Drug use: No   Social History   Social History Narrative   Lives alone in a 2 story home.  Husband passed away on 08-28-2014.     Works one day a week at [employer].     Retired from [employer].  Exercises regularly.    Patient is right-handed.    Review of Systems:  CONSTITUTIONAL: No fevers, chills, night sweats, or weight loss.   EYES: No visual changes or eye pain ENT: No hearing changes.  No history of nose  bleeds.   RESPIRATORY: No cough, wheezing and shortness of breath.   CARDIOVASCULAR: Negative for chest pain, and palpitations.   GI: Negative for abdominal discomfort, blood in stools or black stools.  No recent change in bowel habits.   GU:  No history of incontinence.   MUSCLOSKELETAL: No history of joint pain or swelling.  No myalgias.   SKIN: Negative for lesions, rash, and itching.   HEMATOLOGY/ONCOLOGY: Negative for prolonged bleeding, bruising easily, and swollen nodes.  No history of cancer.   ENDOCRINE: Negative for cold or heat intolerance, polydipsia or goiter.   PSYCH:  No depression or anxiety symptoms.   NEURO: As Above.   Vital Signs:  BP 110/68   Pulse 99   Ht 4\' 10"  (1.473 m)   Wt 137 lb (62.1 kg)   SpO2 98%   BMI 28.63 kg/m    General Medical Exam:   General:  Well appearing, comfortable.   Eyes/ENT: see cranial nerve examination.   Neck: No masses appreciated.  Full range of motion without tenderness.  No carotid bruits. Respiratory:  Clear to auscultation, good air entry bilaterally.   Cardiac:  Regular rate and rhythm, no murmur.   Extremities:  Left dupuytren's contracture, Mild OA deformity of the hands, edema, or skin discoloration.  Skin:  No rashes or lesions.  Neurological Exam: MENTAL STATUS including orientation to time, place, person, recent and remote memory, attention span and concentration, language, and fund of knowledge is normal.  Speech is not dysarthric.  CRANIAL NERVES: II:  No visual field defects.  Unremarkable fundi.   III-IV-VI: Pupils equal round and reactive to light.  Normal conjugate, extra-ocular eye movements in all directions of gaze.  No nystagmus.  No ptosis.   V:  Normal facial sensation.     VII:  Normal facial symmetry and movements.   VIII:  Normal hearing and vestibular function.   IX-X:  Normal palatal movement.   XI:  Normal shoulder shrug and head rotation.   XII:  Normal tongue strength and range of motion, no  deviation or fasciculation.  MOTOR:  No atrophy, fasciculations or abnormal movements.  No pronator drift.  Tone is normal.    Right Upper Extremity:    Left Upper Extremity:    Deltoid  5/5   Deltoid  5/5   Biceps  5/5   Biceps  5/5   Triceps  5/5   Triceps  5/5   Wrist extensors  5/5   Wrist extensors  5/5   Wrist flexors  5/5   Wrist flexors  5/5   Finger extensors  5/5   Finger extensors  5/5   Finger flexors  5/5   Finger flexors  5/5   Dorsal interossei  5/5   Dorsal interossei  5-/5   Abductor pollicis  5/5   Abductor pollicis  5/5   Tone (Ashworth scale)  0  Tone (Ashworth scale)  0   Right Lower Extremity:    Left Lower Extremity:    Hip flexors  5/5   Hip flexors  5/5   Hip extensors  5/5   Hip extensors  5/5   Knee flexors  5/5   Knee flexors  5/5   Knee extensors  5/5   Knee extensors  5/5   Dorsiflexors  5/5   Dorsiflexors  5/5   Plantarflexors  5/5   Plantarflexors  5/5   Toe extensors  5/5   Toe extensors  5/5   Toe flexors  5/5   Toe flexors  5/5   Tone (Ashworth scale)  0  Tone (Ashworth scale)  0   MSRs:  Right                                                                 Left brachioradialis 2+  brachioradialis 2+  biceps 2+  biceps 2+  triceps 2+  triceps 2+  patellar 2+  patellar 2+  ankle jerk 2+  ankle jerk 2+  Hoffman no  Hoffman no  plantar response down  plantar response down   SENSORY:  Normal and symmetric perception of light touch, pinprick, vibration, and proprioception. Tinel's sign is absent at the wrists and elbows bilaterally.    COORDINATION/GAIT: Normal finger-to- nose-finger.  Intact rapid alternating movements bilaterally.   Gait narrow based and stable.    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   I will order NCS/EMG of the left arm to better localize symptoms to guide therapy.  Further  recommendations pending results.   Thank you for allowing me to participate in patient's care.  If I can answer any additional questions, I would be pleased to do so.    Sincerely,    Torianne Laflam K. Posey Pronto, DO

## 2018-09-21 DIAGNOSIS — R141 Gas pain: Secondary | ICD-10-CM | POA: Diagnosis not present

## 2018-09-26 ENCOUNTER — Ambulatory Visit (INDEPENDENT_AMBULATORY_CARE_PROVIDER_SITE_OTHER): Payer: PPO | Admitting: Neurology

## 2018-09-26 DIAGNOSIS — R202 Paresthesia of skin: Secondary | ICD-10-CM | POA: Diagnosis not present

## 2018-09-26 DIAGNOSIS — G5622 Lesion of ulnar nerve, left upper limb: Secondary | ICD-10-CM

## 2018-09-26 DIAGNOSIS — M5412 Radiculopathy, cervical region: Secondary | ICD-10-CM | POA: Diagnosis not present

## 2018-09-26 NOTE — Progress Notes (Signed)
Follow-up Visit   Date: 09/26/18    Sandra Payne MRN: [id number] DOB: 31-Mar-1941   Interim History: Sandra Payne is a 78 y.o. right-handed African American femalewith hyperlipidemia, insomnia, and arthralgias returning to the clinic for follow-up of left hand numbness.  The patient was accompanied to the clinic by self.  History of present illness: Starting around 2015, she began having tingling of the fingers on the left hand.  Symptoms are have become constant.  No specific exacerbating factors.  She does not wake up at night time with her hands falling asleep.  She has noticed that sometimes, she may drop objects from the left hand, such as her medications. She does not have similar problems on the right hand.  She does not have radicular arm pain. 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  She was last seen here in 2016 for the same complaints.  At that visit, I recommended NCS/EMG which was declined due to intermittent symptoms.  UPDATE 09/26/2018:  She is here for electrodiagnostic testing of the left arm.  She continues to have numbness and tingling which is episodic.  She denies any weakness or neck pain.  Medications:  Current Outpatient Medications on File Prior to Visit  Medication Sig Dispense Refill  . acetaminophen (TYLENOL) 325 MG tablet Take 650 mg by mouth every 6 (six) hours as needed.    [name] Kitchen azithromycin (ZITHROMAX) 250 MG tablet Take first 2 tablets together, then 1 every day until finished. 6 tablet 0  . Celecoxib (CELEBREX PO) Take by mouth.    . ferrous sulfate 325 (65 FE) MG tablet Take 325 mg by mouth 2 (two) times daily with a meal.     No current facility-administered medications on file prior to visit.     Allergies:  Allergies  Allergen Reactions  . Codeine Itching  . Sulfa Antibiotics Other (See Comments)    Childhood allergy    Review of Systems:  CONSTITUTIONAL: No fevers, chills, night sweats, or weight loss.    EYES: No visual changes or eye pain ENT: No hearing changes.  No history of nose bleeds.   RESPIRATORY: No cough, wheezing and shortness of breath.   CARDIOVASCULAR: Negative for chest pain, and palpitations.   GI: Negative for abdominal discomfort, blood in stools or black stools.  No recent change in bowel habits.   GU:  No history of incontinence.   MUSCLOSKELETAL: No history of joint pain or swelling.  No myalgias.   SKIN: Negative for lesions, rash, and itching.   ENDOCRINE: Negative for cold or heat intolerance, polydipsia or goiter.   PSYCH:  No depression or anxiety symptoms.   NEURO: As Above.   Vital Signs:  There were no vitals taken for this visit.  Neurological Exam: MENTAL STATUS including orientation to time, place, person, recent and remote memory, attention span and concentration, language, and fund of knowledge is normal.  Speech is not dysarthric.  CRANIAL NERVES  Normal conjugate, extra-ocular eye movements in all directions of gaze.  No ptosis.  Face is symmetric  MOTOR:  Motor strength is 5/5 in all extremities, except 5-/5 instrinsic hand muscles.  No atrophy, fasciculations or abnormal movements.  No pronator drift.  Tone is normal.    COORDINATION/GAIT:   Gait narrow based and stable.   Data: NCS/EMG of the left arm 09/26/2018: 1. Left ulnar neuropathy with slowing across the elbow, purely demyelinating type and mild in degree electrically. 2. Chronic C5  radiculopathy affecting the left upper extremity, mild in degree electrically.  IMPRESSION/PLAN: Left cubital tunnel syndrome, mild.  Discussed strategies to minimize stretching of the ulnar nerve at the elbow, such as minimizing hyperflexion at the elbow and avoiding compression of the nerve.   Left C5 radiculopathy, mild.  She denies radicular arm pain and there is no weakness in the C5 myotome.  If she develop new proximal arm symptoms or neck pain, will proceed with physical therapy.  Return to clinic,  if no improvement   Thank you for allowing me to participate in patient's care.  If I can answer any additional questions, I would be pleased to do so.    Sincerely,    Kaci Freel K. Posey Pronto, DO

## 2018-09-26 NOTE — Procedures (Signed)
[facility]  [place], [address]  [place], [place] [address] Tel: [phone] Fax:  [phone] Test Date:  09/26/2018  Patient: Sandra Payne DOB: [birth year] Physician: Narda Amber, DO  Sex: Female Height: 4\' 10"  Ref Phys: Narda Amber, DO  ID#: [id number] Temp: 36.0C Technician:    Patient Complaints: This is a 78 year old female referred for evaluation of left hand numbness and tingling.  NCV & EMG Findings: Extensive electrodiagnostic testing of the left upper extremity shows:  1. Left median, ulnar, and mixed palmar sensory responses are within normal limits. 2. Left median motor responses within normal limits.  Left ulnar motor response shows slowed conduction velocity across the elbow (A Elbow-B Elbow, 45 m/s).   3. Chronic motor axonal loss changes are seen affecting the deltoid and infraspinatus muscles on the left.  There is no evidence of accompanied active denervation.    Impression: 1. Left ulnar neuropathy with slowing across the elbow, purely demyelinating type and mild in degree electrically. 2. Chronic C5 radiculopathy affecting the left upper extremity, mild in degree electrically.   ___________________________ Narda Amber, DO    Nerve Conduction Studies Anti Sensory Summary Table   Site NR Peak (ms) Norm Peak (ms) P-T Amp (V) Norm P-T Amp  Left Median Anti Sensory (2nd Digit)  36C  Wrist    3.1 <3.8 49.2 >10  Left Ulnar Anti Sensory (5th Digit)  36C  Wrist    2.4 <3.2 24.7 >5   Motor Summary Table   Site NR Onset (ms) Norm Onset (ms) O-P Amp (mV) Norm O-P Amp Site1 Site2 Delta-0 (ms) Dist (cm) Vel (m/s) Norm Vel (m/s)  Left Median Motor (Abd Poll Brev)  36C  Wrist    2.8 <4.0 7.6 >5 Elbow Wrist 4.4 27.0 61 >50  Elbow    7.2  6.6         Left Ulnar Motor (Abd Dig Minimi)  36C  Wrist    1.9 <3.1 8.0 >7 B Elbow Wrist 3.4 20.0 59 >50  B Elbow    5.3  7.9  A Elbow B Elbow 2.2 10.0 45 >50  A Elbow    7.5  7.4          Comparison  Summary Table   Site NR Peak (ms) Norm Peak (ms) P-T Amp (V) Site1 Site2 Delta-P (ms) Norm Delta (ms)  Left Median/Ulnar Palm Comparison (Wrist - 8cm)  36C  Median Palm    1.9 <2.2 67.0 Median Palm Ulnar Palm 0.4   Ulnar Palm    1.5 <2.2 19.4       EMG   Side Muscle Ins Act Fibs Psw Fasc Number Recrt Dur Dur. Amp Amp. Poly Poly. Comment  Left 1stDorInt Nml Nml Nml Nml Nml Nml Nml Nml Nml Nml Nml Nml N/A  Left PronatorTeres Nml Nml Nml Nml Nml Nml Nml Nml Nml Nml Nml Nml N/A  Left Biceps Nml Nml Nml Nml Nml Nml Nml Nml Nml Nml Nml Nml N/A  Left Triceps Nml Nml Nml Nml Nml Nml Nml Nml Nml Nml Nml Nml N/A  Left Deltoid Nml Nml Nml Nml 1- Rapid Some 1+ Some 1+ Nml Nml N/A  Left Infraspinatus Nml Nml Nml Nml 1- Rapid Some 1+ Some 1+ Nml Nml N/A  Left Abd Poll Brev Nml Nml Nml Nml Nml Nml Nml Nml Nml Nml Nml Nml N/A  Left FlexDigProf 4,5 Nml Nml Nml Nml Nml Nml Nml Nml Nml Nml Nml Nml N/A  Waveforms:

## 2018-09-29 ENCOUNTER — Other Ambulatory Visit: Payer: Self-pay | Admitting: Family Medicine

## 2018-09-29 DIAGNOSIS — Z1231 Encounter for screening mammogram for malignant neoplasm of breast: Secondary | ICD-10-CM

## 2019-01-28 DIAGNOSIS — R1084 Generalized abdominal pain: Secondary | ICD-10-CM | POA: Diagnosis not present

## 2019-01-29 ENCOUNTER — Other Ambulatory Visit: Payer: Self-pay

## 2019-01-29 NOTE — Patient Outreach (Signed)
[facility]) Care Management  01/29/2019  Sandra Payne 1941/05/16 [id number]   Referral Date: 01/29/2019 Referral Source: Nurseline Referral Reason: Gas pain   Outreach Attempt: Spoke with patient .  She is able to verify HIPAA. She states that she went to [place] walk-in clinic on yesterday and she was advised to get something for constipation. She states she did get it and has had plenty on  Bowel movements and that she feels some better.  She states that she has contacted her PCP to advise that she did go to the walk-in clinic and if there was anything else she needed to do.  Encouraged patient to follow up with PCP for any further problems. She verbalized understanding and decline any further needs.     Plan: RN CM will close case.     Jone Baseman, RN, MSN [facility] Care Management Care Management Coordinator Direct Line [phone] Toll Free: [phone]  Fax: [phone]

## 2019-01-31 ENCOUNTER — Other Ambulatory Visit: Payer: Self-pay | Admitting: *Deleted

## 2019-01-31 DIAGNOSIS — Z20822 Contact with and (suspected) exposure to covid-19: Secondary | ICD-10-CM

## 2019-01-31 DIAGNOSIS — R6889 Other general symptoms and signs: Secondary | ICD-10-CM | POA: Diagnosis not present

## 2019-01-31 NOTE — Progress Notes (Signed)
[id number] 

## 2019-02-02 DIAGNOSIS — R109 Unspecified abdominal pain: Secondary | ICD-10-CM | POA: Diagnosis not present

## 2019-02-02 DIAGNOSIS — N390 Urinary tract infection, site not specified: Secondary | ICD-10-CM | POA: Diagnosis not present

## 2019-02-02 LAB — NOVEL CORONAVIRUS, NAA: SARS-CoV-2, NAA: NOT DETECTED

## 2019-02-16 DIAGNOSIS — R221 Localized swelling, mass and lump, neck: Secondary | ICD-10-CM | POA: Diagnosis not present

## 2019-02-18 DIAGNOSIS — R221 Localized swelling, mass and lump, neck: Secondary | ICD-10-CM | POA: Diagnosis not present

## 2019-02-19 ENCOUNTER — Ambulatory Visit
Admission: RE | Admit: 2019-02-19 | Discharge: 2019-02-19 | Disposition: A | Discharge status: Home / Self Care | Payer: PPO | Source: Ambulatory Visit | Attending: Family Medicine | Admitting: Family Medicine

## 2019-02-19 ENCOUNTER — Other Ambulatory Visit: Payer: Self-pay

## 2019-02-19 ENCOUNTER — Other Ambulatory Visit: Payer: Self-pay | Admitting: Family Medicine

## 2019-02-19 DIAGNOSIS — R590 Localized enlarged lymph nodes: Secondary | ICD-10-CM | POA: Diagnosis not present

## 2019-02-19 DIAGNOSIS — R221 Localized swelling, mass and lump, neck: Secondary | ICD-10-CM

## 2019-02-19 MED ORDER — IOPAMIDOL (ISOVUE-300) INJECTION 61%
75.0000 mL | Freq: Once | INTRAVENOUS | Status: AC | PRN
  Administered 2019-02-19: 75 mL via INTRAVENOUS

## 2019-02-20 DIAGNOSIS — R221 Localized swelling, mass and lump, neck: Secondary | ICD-10-CM | POA: Diagnosis not present

## 2019-03-05 ENCOUNTER — Encounter (HOSPITAL_BASED_OUTPATIENT_CLINIC_OR_DEPARTMENT_OTHER): Payer: Self-pay | Admitting: *Deleted

## 2019-03-05 ENCOUNTER — Other Ambulatory Visit: Payer: Self-pay

## 2019-03-06 NOTE — H&P (Signed)
HPI:   Sandra Payne is a 78 y.o. female who presents as a new Patient.   Referring Provider: Self, A Referral  Chief complaint: Neck mass.  HPI: She noticed a large lump in her left lower neck on Thursday. She has no other symptoms related to this. It does not seem to have fluctuated. She denies any fever, sweats, change in appetite, GI symptoms.  PMH/Meds/All/SocHx/FamHx/ROS:   Past Medical History:  Diagnosis Date  . Arthritis  . Vertigo   Past Surgical History:  Procedure Laterality Date  . REPLACEMENT TOTAL KNEE   No family history of bleeding disorders, wound healing problems or difficulty with anesthesia.   Social History   Socioeconomic History  . Marital status: Not on file  Spouse name: Not on file  . Number of children: Not on file  . Years of education: Not on file  . Highest education level: Not on file  Occupational History  . Not on file  Social Needs  . Financial resource strain: Not on file  . Food insecurity  Worry: Not on file  Inability: Not on file  . Transportation needs  Medical: Not on file  Non-medical: Not on file  Tobacco Use  . Smoking status: Former Research scientist (life sciences)  . Smokeless tobacco: Never Used  Substance and Sexual Activity  . Alcohol use: Not on file  . Drug use: Not on file  . Sexual activity: Not on file  Lifestyle  . Physical activity  Days per week: Not on file  Minutes per session: Not on file  . Stress: Not on file  Relationships  . Social Medical illustrator on phone: Not on file  Gets together: Not on file  Attends religious service: Not on file  Active member of club or organization: Not on file  Attends meetings of clubs or organizations: Not on file  Relationship status: Not on file  Other Topics Concern  . Not on file  Social History Narrative  . Not on file   Current Outpatient Medications:  . celecoxib (CELEBREX) 100 MG capsule, Take by mouth., Disp: , Rfl:  . FA/mv,Ca,iron,min/lycopene/lut (MULTIVITAL ORAL),  Take by mouth., Disp: , Rfl:  . meclizine HCl (MECLIZINE ORAL), Take by mouth., Disp: , Rfl:  . traMADoL (ULTRAM) 50 mg tablet, , Disp: , Rfl:  . zolpidem tartrate (AMBIEN ORAL), Take by mouth., Disp: , Rfl:   A complete ROS was performed with pertinent positives/negatives noted in the HPI. The remainder of the ROS are negative.   Physical Exam:   Ht 1.473 m (4\' 10" )  Wt 60.8 kg (134 lb)  BMI 28.01 kg/m   General: Healthy and alert, in no distress, breathing easily. Normal affect. In a pleasant mood. Head: Normocephalic, atraumatic. No masses, or scars. Eyes: Pupils are equal, and reactive to light. Vision is grossly intact. No spontaneous or gaze nystagmus. Ears: Ear canals are clear. Tympanic membranes are intact, with normal landmarks and the middle ears are clear and healthy. Hearing: Grossly normal. Nose: Nasal cavities are clear with healthy mucosa, no polyps or exudate. Airways are patent. Face: No masses or scars, facial nerve function is symmetric. Oral Cavity: No mucosal abnormalities are noted. Tongue with normal mobility. Dentition appears healthy. Oropharynx: Tonsils are symmetric. There are no mucosal masses identified. Tongue base appears normal and healthy. Larynx/Hypopharynx: indirect exam reveals healthy, mobile vocal cords, without mucosal lesions in the hypopharynx or larynx. Chest: Deferred Neck: Large multilobulated firm mass involving the left level 4, lower level 5, and supraclavicular  fossa. No other masses appreciated. Neuro: Cranial nerves II-XII with normal function. Balance: Normal gate. Other findings: none.  Independent Review of Additional Tests or Records:  none  Procedures:  Procedure Note:  Indications for procedure: neck mass  Details of the procedure were discussed with the patient and all questions were answered.  Procedure:  2% xylocaine with epinephrine was infiltrated into the overlying skin. First pass was made with a 25 gauge  needle and 10 cc syringe. Second pass was made with a 22 gauge needle. Specimen was placed on microscopic slides and air-dried. Additional material was placed in Cytolyte solution for cell block preparation. A third pass was made with a 22 guage needle and sample was added to the cytolyte solution.  A bandage was applied.   She tolerated the procedure well. Results will be discussed when available.  Impression & Plans:  Large neck mass concerning for malignancy possibly even GI source. FNA performed. We will discuss results when available.

## 2019-03-08 ENCOUNTER — Other Ambulatory Visit (HOSPITAL_COMMUNITY)
Admission: RE | Admit: 2019-03-08 | Discharge: 2019-03-08 | Disposition: A | Discharge status: Home / Self Care | Payer: PPO | Source: Ambulatory Visit | Attending: Otolaryngology | Admitting: Otolaryngology

## 2019-03-08 DIAGNOSIS — Z1159 Encounter for screening for other viral diseases: Secondary | ICD-10-CM | POA: Diagnosis not present

## 2019-03-08 LAB — SARS CORONAVIRUS 2 (TAT 6-24 HRS): SARS Coronavirus 2: NEGATIVE

## 2019-03-12 ENCOUNTER — Encounter (HOSPITAL_BASED_OUTPATIENT_CLINIC_OR_DEPARTMENT_OTHER): Payer: Self-pay | Admitting: *Deleted

## 2019-03-12 ENCOUNTER — Encounter (HOSPITAL_BASED_OUTPATIENT_CLINIC_OR_DEPARTMENT_OTHER)
Admission: RE | Disposition: A | Discharge status: Home / Self Care | Payer: Self-pay | Source: Home / Self Care | Attending: Otolaryngology

## 2019-03-12 ENCOUNTER — Ambulatory Visit (HOSPITAL_BASED_OUTPATIENT_CLINIC_OR_DEPARTMENT_OTHER): Payer: PPO | Admitting: Anesthesiology

## 2019-03-12 ENCOUNTER — Ambulatory Visit (HOSPITAL_BASED_OUTPATIENT_CLINIC_OR_DEPARTMENT_OTHER)
Admission: RE | Admit: 2019-03-12 | Discharge: 2019-03-12 | Disposition: A | Discharge status: Home / Self Care | Payer: PPO | Attending: Otolaryngology | Admitting: Otolaryngology

## 2019-03-12 ENCOUNTER — Other Ambulatory Visit: Payer: Self-pay

## 2019-03-12 DIAGNOSIS — C8339 Diffuse large B-cell lymphoma, extranodal and solid organ sites: Secondary | ICD-10-CM | POA: Diagnosis not present

## 2019-03-12 DIAGNOSIS — Z79891 Long term (current) use of opiate analgesic: Secondary | ICD-10-CM | POA: Diagnosis not present

## 2019-03-12 DIAGNOSIS — Z87891 Personal history of nicotine dependence: Secondary | ICD-10-CM | POA: Insufficient documentation

## 2019-03-12 DIAGNOSIS — R42 Dizziness and giddiness: Secondary | ICD-10-CM | POA: Insufficient documentation

## 2019-03-12 DIAGNOSIS — Z791 Long term (current) use of non-steroidal anti-inflammatories (NSAID): Secondary | ICD-10-CM | POA: Diagnosis not present

## 2019-03-12 DIAGNOSIS — Z96659 Presence of unspecified artificial knee joint: Secondary | ICD-10-CM | POA: Insufficient documentation

## 2019-03-12 DIAGNOSIS — C8331 Diffuse large B-cell lymphoma, lymph nodes of head, face, and neck: Secondary | ICD-10-CM | POA: Diagnosis not present

## 2019-03-12 DIAGNOSIS — R221 Localized swelling, mass and lump, neck: Secondary | ICD-10-CM | POA: Diagnosis not present

## 2019-03-12 DIAGNOSIS — M1990 Unspecified osteoarthritis, unspecified site: Secondary | ICD-10-CM | POA: Diagnosis not present

## 2019-03-12 HISTORY — DX: Localized swelling, mass and lump, neck: R22.1

## 2019-03-12 HISTORY — PX: MASS BIOPSY: SHX5445

## 2019-03-12 SURGERY — BIOPSY, MASS, NECK
Anesthesia: General | Site: Neck | Laterality: Left

## 2019-03-12 MED ORDER — LIDOCAINE HCL (CARDIAC) PF 100 MG/5ML IV SOSY
PREFILLED_SYRINGE | INTRAVENOUS | Status: DC | PRN
  Administered 2019-03-12: 50 mg via INTRAVENOUS

## 2019-03-12 MED ORDER — ONDANSETRON HCL 4 MG/2ML IJ SOLN
INTRAMUSCULAR | Status: DC | PRN
  Administered 2019-03-12: 4 mg via INTRAVENOUS

## 2019-03-12 MED ORDER — PROPOFOL 10 MG/ML IV BOLUS
INTRAVENOUS | Status: DC | PRN
  Administered 2019-03-12: 30 mg via INTRAVENOUS
  Administered 2019-03-12: 150 mg via INTRAVENOUS
  Administered 2019-03-12: 20 mg via INTRAVENOUS

## 2019-03-12 MED ORDER — HEPARIN (PORCINE) IN NACL 1000-0.9 UT/500ML-% IV SOLN
INTRAVENOUS | Status: AC
  Filled 2019-03-12: qty 500

## 2019-03-12 MED ORDER — FENTANYL CITRATE (PF) 100 MCG/2ML IJ SOLN
25.0000 ug | INTRAMUSCULAR | Status: DC | PRN
  Administered 2019-03-12: 25 ug via INTRAVENOUS
  Administered 2019-03-12: 50 ug via INTRAVENOUS

## 2019-03-12 MED ORDER — PROMETHAZINE HCL 25 MG RE SUPP: 25.0000 mg | Freq: Four times a day (QID) | RECTAL | 1 refills | Status: DC | PRN

## 2019-03-12 MED ORDER — OXYCODONE HCL 5 MG PO TABS: 5.0000 mg | ORAL_TABLET | Freq: Once | ORAL | Status: DC | PRN

## 2019-03-12 MED ORDER — OXYMETAZOLINE HCL 0.05 % NA SOLN
NASAL | Status: AC
  Filled 2019-03-12: qty 30

## 2019-03-12 MED ORDER — ALBUTEROL SULFATE HFA 108 (90 BASE) MCG/ACT IN AERS
INHALATION_SPRAY | RESPIRATORY_TRACT | Status: DC | PRN
  Administered 2019-03-12 (×2): 4 via RESPIRATORY_TRACT

## 2019-03-12 MED ORDER — HEPARIN SOD (PORK) LOCK FLUSH 100 UNIT/ML IV SOLN
INTRAVENOUS | Status: AC
  Filled 2019-03-12: qty 5

## 2019-03-12 MED ORDER — FENTANYL CITRATE (PF) 100 MCG/2ML IJ SOLN
INTRAMUSCULAR | Status: AC
  Filled 2019-03-12: qty 2

## 2019-03-12 MED ORDER — KETOROLAC TROMETHAMINE 30 MG/ML IJ SOLN
INTRAMUSCULAR | Status: AC
  Filled 2019-03-12: qty 1

## 2019-03-12 MED ORDER — SCOPOLAMINE 1 MG/3DAYS TD PT72: 1.0000 | MEDICATED_PATCH | Freq: Once | TRANSDERMAL | Status: DC

## 2019-03-12 MED ORDER — LACTATED RINGERS IV SOLN
INTRAVENOUS | Status: DC
  Administered 2019-03-12 (×2): via INTRAVENOUS

## 2019-03-12 MED ORDER — KETOROLAC TROMETHAMINE 30 MG/ML IJ SOLN
INTRAMUSCULAR | Status: DC | PRN
  Administered 2019-03-12: 30 mg via INTRAVENOUS

## 2019-03-12 MED ORDER — BUPIVACAINE HCL (PF) 0.5 % IJ SOLN
INTRAMUSCULAR | Status: AC
  Filled 2019-03-12: qty 30

## 2019-03-12 MED ORDER — FENTANYL CITRATE (PF) 100 MCG/2ML IJ SOLN
50.0000 ug | INTRAMUSCULAR | Status: DC | PRN
  Administered 2019-03-12 (×2): 50 ug via INTRAVENOUS

## 2019-03-12 MED ORDER — LIDOCAINE HCL (PF) 1 % IJ SOLN
INTRAMUSCULAR | Status: AC
  Filled 2019-03-12: qty 30

## 2019-03-12 MED ORDER — LIDOCAINE-EPINEPHRINE 1 %-1:100000 IJ SOLN
INTRAMUSCULAR | Status: AC
  Filled 2019-03-12: qty 1

## 2019-03-12 MED ORDER — HYDROCODONE-ACETAMINOPHEN 7.5-325 MG PO TABS: 1.0000 | ORAL_TABLET | Freq: Four times a day (QID) | ORAL | 0 refills | Status: DC | PRN

## 2019-03-12 MED ORDER — ONDANSETRON HCL 4 MG/2ML IJ SOLN: 4.0000 mg | Freq: Four times a day (QID) | INTRAMUSCULAR | Status: DC | PRN

## 2019-03-12 MED ORDER — DEXAMETHASONE SODIUM PHOSPHATE 4 MG/ML IJ SOLN
INTRAMUSCULAR | Status: DC | PRN
  Administered 2019-03-12: 10 mg via INTRAVENOUS

## 2019-03-12 MED ORDER — BUPIVACAINE-EPINEPHRINE (PF) 0.5% -1:200000 IJ SOLN
INTRAMUSCULAR | Status: AC
  Filled 2019-03-12: qty 30

## 2019-03-12 MED ORDER — ONDANSETRON HCL 4 MG/2ML IJ SOLN
INTRAMUSCULAR | Status: AC
  Filled 2019-03-12: qty 2

## 2019-03-12 MED ORDER — EPHEDRINE SULFATE 50 MG/ML IJ SOLN
INTRAMUSCULAR | Status: DC | PRN
  Administered 2019-03-12: 10 mg via INTRAVENOUS

## 2019-03-12 MED ORDER — DEXAMETHASONE SODIUM PHOSPHATE 10 MG/ML IJ SOLN
INTRAMUSCULAR | Status: AC
  Filled 2019-03-12: qty 1

## 2019-03-12 MED ORDER — PROPOFOL 500 MG/50ML IV EMUL
INTRAVENOUS | Status: AC
  Filled 2019-03-12: qty 50

## 2019-03-12 MED ORDER — OXYCODONE HCL 5 MG/5ML PO SOLN: 5.0000 mg | Freq: Once | ORAL | Status: DC | PRN

## 2019-03-12 MED ORDER — MIDAZOLAM HCL 2 MG/2ML IJ SOLN: 1.0000 mg | INTRAMUSCULAR | Status: DC | PRN

## 2019-03-12 MED ORDER — LIDOCAINE-EPINEPHRINE (PF) 1 %-1:200000 IJ SOLN
INTRAMUSCULAR | Status: AC
  Filled 2019-03-12: qty 30

## 2019-03-12 MED ORDER — LIDOCAINE 2% (20 MG/ML) 5 ML SYRINGE
INTRAMUSCULAR | Status: AC
  Filled 2019-03-12: qty 5

## 2019-03-12 SURGICAL SUPPLY — 67 items
ADH SKN CLS APL DERMABOND .7 (GAUZE/BANDAGES/DRESSINGS) ×1
APL SKNCLS STERI-STRIP NONHPOA (GAUZE/BANDAGES/DRESSINGS)
ATTRACTOMAT 16X20 MAGNETIC DRP (DRAPES) IMPLANT
BAND INSRT 18 STRL LF DISP RB (MISCELLANEOUS)
BAND RUBBER #18 3X1/16 STRL (MISCELLANEOUS) IMPLANT
BENZOIN TINCTURE PRP APPL 2/3 (GAUZE/BANDAGES/DRESSINGS) IMPLANT
BLADE SURG 15 STRL LF DISP TIS (BLADE) ×1 IMPLANT
BLADE SURG 15 STRL SS (BLADE) ×3
CANISTER SUCT 1200ML W/VALVE (MISCELLANEOUS) ×3 IMPLANT
CLEANER CAUTERY TIP 5X5 PAD (MISCELLANEOUS) ×1 IMPLANT
CLIP VESOCCLUDE MED 6/CT (CLIP) IMPLANT
CLIP VESOCCLUDE SM WIDE 6/CT (CLIP) IMPLANT
CLOSURE WOUND 1/2 X4 (GAUZE/BANDAGES/DRESSINGS)
CORD BIPOLAR FORCEPS 12FT (ELECTRODE) IMPLANT
COVER BACK TABLE REUSABLE LG (DRAPES) ×3 IMPLANT
COVER MAYO STAND REUSABLE (DRAPES) ×3 IMPLANT
COVER WAND RF STERILE (DRAPES) IMPLANT
DERMABOND ADVANCED (GAUZE/BANDAGES/DRESSINGS) ×2
DERMABOND ADVANCED .7 DNX12 (GAUZE/BANDAGES/DRESSINGS) IMPLANT
DRAIN JACKSON RD 7FR 3/32 (WOUND CARE) IMPLANT
DRAIN PENROSE 1/4X12 LTX STRL (WOUND CARE) IMPLANT
DRAPE SPLIT 6X30 W/TAPE (DRAPES) ×3 IMPLANT
ELECT COATED BLADE 2.86 ST (ELECTRODE) ×3 IMPLANT
ELECT REM PT RETURN 9FT ADLT (ELECTROSURGICAL) ×3
ELECTRODE REM PT RTRN 9FT ADLT (ELECTROSURGICAL) ×1 IMPLANT
EVACUATOR SILICONE 100CC (DRAIN) IMPLANT
GAUZE 4X4 16PLY RFD (DISPOSABLE) IMPLANT
GAUZE SPONGE 4X4 12PLY STRL LF (GAUZE/BANDAGES/DRESSINGS) IMPLANT
GLOVE BIOGEL PI IND STRL 7.0 (GLOVE) IMPLANT
GLOVE BIOGEL PI INDICATOR 7.0 (GLOVE) ×4
GLOVE ECLIPSE 6.5 STRL STRAW (GLOVE) ×2 IMPLANT
GLOVE ECLIPSE 7.5 STRL STRAW (GLOVE) ×3 IMPLANT
GOWN STRL REUS W/ TWL LRG LVL3 (GOWN DISPOSABLE) ×1 IMPLANT
GOWN STRL REUS W/ TWL XL LVL3 (GOWN DISPOSABLE) ×1 IMPLANT
GOWN STRL REUS W/TWL LRG LVL3 (GOWN DISPOSABLE) ×3
GOWN STRL REUS W/TWL XL LVL3 (GOWN DISPOSABLE) ×3
HEMOSTAT SURGICEL .5X2 ABSORB (HEMOSTASIS) ×2 IMPLANT
NDL PRECISIONGLIDE 27X1.5 (NEEDLE) IMPLANT
NEEDLE PRECISIONGLIDE 27X1.5 (NEEDLE) ×3 IMPLANT
NS IRRIG 1000ML POUR BTL (IV SOLUTION) IMPLANT
PACK BASIN DAY SURGERY FS (CUSTOM PROCEDURE TRAY) ×3 IMPLANT
PAD CLEANER CAUTERY TIP 5X5 (MISCELLANEOUS) ×2
PENCIL FOOT CONTROL (ELECTRODE) ×3 IMPLANT
SLEEVE SCD COMPRESS KNEE MED (MISCELLANEOUS) ×2 IMPLANT
SPONGE GAUZE 2X2 8PLY STER LF (GAUZE/BANDAGES/DRESSINGS)
SPONGE GAUZE 2X2 8PLY STRL LF (GAUZE/BANDAGES/DRESSINGS) IMPLANT
STAPLER VISISTAT 35W (STAPLE) IMPLANT
STRIP CLOSURE SKIN 1/2X4 (GAUZE/BANDAGES/DRESSINGS) IMPLANT
SUCTION FRAZIER HANDLE 10FR (MISCELLANEOUS) ×2
SUCTION TUBE FRAZIER 10FR DISP (MISCELLANEOUS) IMPLANT
SUT CHROMIC 3 0 PS 2 (SUTURE) ×2 IMPLANT
SUT CHROMIC 4 0 P 3 18 (SUTURE) IMPLANT
SUT ETHILON 4 0 PS 2 18 (SUTURE) IMPLANT
SUT ETHILON 5 0 P 3 18 (SUTURE)
SUT NYLON ETHILON 5-0 P-3 1X18 (SUTURE) IMPLANT
SUT PLAIN 5 0 P 3 18 (SUTURE) IMPLANT
SUT SILK 3 0 SH 30 (SUTURE) ×2 IMPLANT
SUT SILK 3 0 TIES 17X18 (SUTURE) ×3
SUT SILK 3-0 18XBRD TIE BLK (SUTURE) IMPLANT
SUT SILK 4 0 TIES 17X18 (SUTURE) IMPLANT
SUT VICRYL 4-0 PS2 18IN ABS (SUTURE) IMPLANT
SYR BULB 3OZ (MISCELLANEOUS) IMPLANT
SYR CONTROL 10ML LL (SYRINGE) ×3 IMPLANT
TOWEL GREEN STERILE FF (TOWEL DISPOSABLE) ×3 IMPLANT
TRAY DSU PREP LF (CUSTOM PROCEDURE TRAY) ×3 IMPLANT
TUBE CONNECTING 20'X1/4 (TUBING) ×1
TUBE CONNECTING 20X1/4 (TUBING) ×2 IMPLANT

## 2019-03-12 NOTE — Discharge Instructions (Signed)
You may shower and use soap and water. Do not use any creams, oils or ointment.  Post Anesthesia Home Care Instructions  Activity: Get plenty of rest for the remainder of the day. A responsible individual must stay with you for 24 hours following the procedure.  For the next 24 hours, DO NOT: -Drive a car -Paediatric nurse -Drink alcoholic beverages -Take any medication unless instructed by your physician -Make any legal decisions or sign important papers.  Meals: Start with liquid foods such as gelatin or soup. Progress to regular foods as tolerated. Avoid greasy, spicy, heavy foods. If nausea and/or vomiting occur, drink only clear liquids until the nausea and/or vomiting subsides. Call your physician if vomiting continues.  Special Instructions/Symptoms: Your throat may feel dry or sore from the anesthesia or the breathing tube placed in your throat during surgery. If this causes discomfort, gargle with warm salt water. The discomfort should disappear within 24 hours.  If you had a scopolamine patch placed behind your ear for the management of post- operative nausea and/or vomiting:  1. The medication in the patch is effective for 72 hours, after which it should be removed.  Wrap patch in a tissue and discard in the trash. Wash hands thoroughly with soap and water. 2. You may remove the patch earlier than 72 hours if you experience unpleasant side effects which may include dry mouth, dizziness or visual disturbances. 3. Avoid touching the patch. Wash your hands with soap and water after contact with the patch.

## 2019-03-12 NOTE — Anesthesia Preprocedure Evaluation (Signed)
Anesthesia Evaluation  Patient identified by MRN, date of birth, ID band Patient awake    Reviewed: Allergy & Precautions, H&P , NPO status , Patient's Chart, lab work & pertinent test results  Airway Mallampati: II   Neck ROM: full    Dental   Pulmonary former smoker,    breath sounds clear to auscultation       Cardiovascular negative cardio ROS   Rhythm:regular Rate:Normal     Neuro/Psych    GI/Hepatic   Endo/Other    Renal/GU      Musculoskeletal  (+) Arthritis ,   Abdominal   Peds  Hematology   Anesthesia Other Findings   Reproductive/Obstetrics                             Anesthesia Physical Anesthesia Plan  ASA: II  Anesthesia Plan: General   Post-op Pain Management:    Induction: Intravenous  PONV Risk Score and Plan: 3 and Ondansetron, Dexamethasone and Treatment may vary due to age or medical condition  Airway Management Planned: Oral ETT  Additional Equipment:   Intra-op Plan:   Post-operative Plan: Extubation in OR  Informed Consent: I have reviewed the patients History and Physical, chart, labs and discussed the procedure including the risks, benefits and alternatives for the proposed anesthesia with the patient or authorized representative who has indicated his/her understanding and acceptance.       Plan Discussed with: Anesthesiologist, CRNA and Surgeon  Anesthesia Plan Comments:         Anesthesia Quick Evaluation

## 2019-03-12 NOTE — Interval H&P Note (Signed)
History and Physical Interval Note:  03/12/2019 7:19 AM  Sandra Payne  has presented today for surgery, with the diagnosis of Neck mass.  The various methods of treatment have been discussed with the patient and family. After consideration of risks, benefits and other options for treatment, the patient has consented to  Procedure(s): NECK MASS BIOPSY (Left) as a surgical intervention.  The patient's history has been reviewed, patient examined, no change in status, stable for surgery.  I have reviewed the patient's chart and labs.  Questions were answered to the patient's satisfaction.     Izora Gala

## 2019-03-12 NOTE — Op Note (Signed)
OPERATIVE REPORT  DATE OF SURGERY: 03/12/2019  PATIENT:  Sandra Payne,  78 y.o. female  PRE-OPERATIVE DIAGNOSIS:  Neck mass  POST-OPERATIVE DIAGNOSIS:  Neck mass  PROCEDURE:  Procedure(s): NECK MASS BIOPSY  SURGEON:  Beckie Salts, MD  ASSISTANTS: None  ANESTHESIA:   General   EBL: 50 ml  DRAINS: None  LOCAL MEDICATIONS USED:  None  SPECIMEN: Incisional biopsy left neck mass  COUNTS:  Correct  PROCEDURE DETAILS: The patient was taken to the operating room and placed on the operating table in the supine position. Following induction of general endotracheal anesthesia, the left neck was prepped and draped in a standard fashion.  The mass was identified in the supraclavicular fossa.  Transverse incision was outlined along the upper border of the clavicle crossing the anterior border of the sternocleidomastoid muscle.  Electrocautery was used to incise the skin and subcutaneous tissue and through the platysma layer.  Self-retaining retractors were used for the remainder of the case.  The sternocleidomastoid muscle was reflected posteriorly towards the left.  The mass was identified easily.  Electrocautery was used to carve out a fragment of the mass approximately 1-1/2 cm x 1 x 1 cm.  This was sent fresh for lymphoma work-up.  During the dissection venous bleeding was encountered and this was treated with a right angle clamp and a suture ligature of 3-0 silk.  The wound was irrigated with saline.  A small piece of Surgicel was placed into the depths of the wound.  The wound was closed in layers using interrupted 3-0 chromic on the platysma layer and running 3-0 chromic on the skin subcuticular.  Dermabond was used on the skin surface.  Patient was awakened extubated and transferred to recovery in stable condition.    PATIENT DISPOSITION:  To PACU, stable

## 2019-03-12 NOTE — Transfer of Care (Signed)
Immediate Anesthesia Transfer of Care Note  Patient: Sandra Payne  Procedure(s) Performed: NECK MASS BIOPSY (Left Neck)  Patient Location: PACU  Anesthesia Type:General  Level of Consciousness: awake and patient cooperative  Airway & Oxygen Therapy: Patient Spontanous Breathing and Patient connected to face mask oxygen  Post-op Assessment: Report given to RN and Post -op Vital signs reviewed and stable  Post vital signs: Reviewed and stable  Last Vitals:  Vitals Value Taken Time  BP 120/73 03/12/19 0849  Temp    Pulse 103 03/12/19 0851  Resp 20 03/12/19 0851  SpO2 100 % 03/12/19 0851  Vitals shown include unvalidated device data.  Last Pain:  Vitals:   03/12/19 0656  TempSrc: Oral  PainSc: 0-No pain      Patients Stated Pain Goal: 3 (97/67/34 1937)  Complications: No apparent anesthesia complications

## 2019-03-12 NOTE — Anesthesia Procedure Notes (Signed)
Procedure Name: Intubation Date/Time: 03/12/2019 7:34 AM Performed by: Lyndee Leo, CRNA Pre-anesthesia Checklist: Patient identified, Emergency Drugs available, Suction available and Patient being monitored Patient Re-evaluated:Patient Re-evaluated prior to induction Oxygen Delivery Method: Circle system utilized Preoxygenation: Pre-oxygenation with 100% oxygen Induction Type: IV induction and Cricoid Pressure applied Ventilation: Mask ventilation without difficulty Laryngoscope Size: Mac and 3 Grade View: Grade I Tube type: Oral Tube size: 7.0 mm Number of attempts: 1 Airway Equipment and Method: Stylet and Oral airway Placement Confirmation: ETT inserted through vocal cords under direct vision,  positive ETCO2 and breath sounds checked- equal and bilateral Secured at: 20 cm Tube secured with: Tape Dental Injury: Teeth and Oropharynx as per pre-operative assessment

## 2019-03-12 NOTE — Progress Notes (Signed)
Pt came in to the surgical center chewing gum. Dr. Marcie Bal aware.

## 2019-03-13 ENCOUNTER — Encounter (HOSPITAL_BASED_OUTPATIENT_CLINIC_OR_DEPARTMENT_OTHER): Payer: Self-pay | Admitting: Otolaryngology

## 2019-03-13 NOTE — Anesthesia Postprocedure Evaluation (Signed)
Anesthesia Post Note  Patient: Sandra Payne  Procedure(s) Performed: NECK MASS BIOPSY (Left Neck)     Patient location during evaluation: PACU Anesthesia Type: General Level of consciousness: awake and alert Pain management: pain level controlled Vital Signs Assessment: post-procedure vital signs reviewed and stable Respiratory status: spontaneous breathing, nonlabored ventilation, respiratory function stable and patient connected to nasal cannula oxygen Cardiovascular status: blood pressure returned to baseline and stable Postop Assessment: no apparent nausea or vomiting Anesthetic complications: no    Last Vitals:  Vitals:   03/12/19 0945 03/12/19 1015  BP: 123/63 121/71  Pulse: 96 (!) 102  Resp: 19 18  Temp: 36.6 C 36.6 C  SpO2: 97% 97%    Last Pain:  Vitals:   03/13/19 1117  TempSrc:   PainSc: [place]

## 2019-03-19 ENCOUNTER — Other Ambulatory Visit: Payer: Self-pay | Admitting: Hematology

## 2019-03-19 ENCOUNTER — Telehealth: Payer: Self-pay | Admitting: *Deleted

## 2019-03-19 ENCOUNTER — Telehealth: Payer: Self-pay | Admitting: Hematology

## 2019-03-19 DIAGNOSIS — C8331 Diffuse large B-cell lymphoma, lymph nodes of head, face, and neck: Secondary | ICD-10-CM

## 2019-03-19 DIAGNOSIS — C8330 Diffuse large B-cell lymphoma, unspecified site: Secondary | ICD-10-CM | POA: Insufficient documentation

## 2019-03-19 DIAGNOSIS — C8591 Non-Hodgkin lymphoma, unspecified, lymph nodes of head, face, and neck: Secondary | ICD-10-CM

## 2019-03-19 NOTE — Telephone Encounter (Signed)
Confirmed with patient 7/29 new patient visit with Dr. Maylon Peppers at 9 am.

## 2019-03-19 NOTE — Telephone Encounter (Signed)
Oncology Nurse Navigator Documentation  Placed introductory call to new referral patient Ms. Schrecengost.  Introduced myself as the H&N oncology nurse navigator that works with Drs. Maylon Peppers and Isidore Moos to whom she has been referred by ENT Dr. Constance Holster. She confirmed understanding of referral.  Briefly explained my role as her navigator, provided my contact information.   Explained tentative appt with Dr. Maylon Peppers this Wed 9:00, to be confirmed.  Confirmed understanding of [facility] location, explained arrival and registration process.  She noted appt with Dr. Constance Holster this afternoon 2:20 to discuss bx results/tmt options.  I explained the purpose of a dental evaluation prior to starting RT, indicated she wd be contacted by [facility] to arrange an appt within a day or so of appt with Dr. Isidore Moos.   I encouraged her to call me with questions/concerns as she moves forward with appts.    She verbalized understanding of information provided, expressed appreciation for my call.   Navigator Initial Assessment . Employment Status/FMLA/STD:  Retired . Support System:  Son and granddaughter who live in [place]; great-great granddaughter lives locally, friends . PCP:  Dr. Jonathon Jordan . PCD:  Dr. Carmel [place] . Transportation Needs: No . Sensory Deficits/Language Barriers/Interpreter Needed:  No . Ambulation Needs: No . DME Used in Home: No . Psychosocial Needs:  No . Concerns/Needs Understanding Cancer:  Navigator answered questions, provided information. . Self-Expressed Needs: No  [name], RN, BSN Head & Neck Oncology Nurse [place] at [facility] [phone]

## 2019-03-19 NOTE — Progress Notes (Signed)
[facility] NOTE  Patient Care Team: Jonathon Jordan, MD as PCP - General (Family Medicine) Tish Men, MD as Consulting Physician (Hematology) Eppie Gibson, MD as Attending Physician (Radiation Oncology) Leota Sauers, RN as Oncology Nurse Navigator  HEME/ONC OVERVIEW: 1. DLBCL, stage TBD -Late 01/2019: bulky left cervical adenopathy extending into the superior mediastinum (largest 7.1 x 3.9 x 5.8 cm) -02/2019: incision LN bx showed DLBCL, GCB subtype, Ki-67 30-40%. FISH pending  ASSESSMENT & PLAN:   DLBCL, stage TBD -I reviewed the patient's records in detail, including ENT clinic notes, lab studies, and imaging results -I also independently reviewed radiologic images of recent CT neck, and agree with findings as documented -In summary, patient presented with Dr. Constance Holster in late 01/2019 for evaluation of enlarging left neck, and CT neck showed bulky left cervical adenopathy, the largest LN measuring 7.1 x 3.9 x 5.8 cm.  Patient underwent incisional LN in mid-02/2019, which showed DLBCL, FISH pending.  -I reviewed imaging and pathology results in detail with the patient, as well as NCCN guideline -I also discussed with the patient in detail the work-up for newly diagnosed DLBCL -I have ordered baseline serologies, including Hep B/C, HIV, LDH and uric acid  -In addition, I have ordered PET scan to assess the extent of lymphadenopathy, and TTE to assess the baseline cardiac function  -In anticipation of chemotherapy, I have requested port placement to facilitate treatment  -Finally, I have requested bone marrow biopsy to rule out marrow involvement, unless PET shows bony involvement, in which case bone marrow involvement is presumed  -Pending the work-up, we will determine the treatment options   Thrombocytosis -Likely reactive in the setting of lymphoma -Plts 401k today -We will monitor it for now  Orders Placed This Encounter  Procedures  . NM PET Image  Initial (PI) Skull Base To Thigh    Standing Status:   Future    Standing Expiration Date:   03/20/2020    Order Specific Question:   If indicated for the ordered procedure, I authorize the administration of a radiopharmaceutical per Radiology protocol    Answer:   Yes    Order Specific Question:   Preferred imaging location?    Answer:   [facility]    Order Specific Question:   Radiology Contrast Protocol - do NOT remove file path    Answer:   [email or web address]  . IR IMAGING GUIDED PORT INSERTION    Standing Status:   Future    Standing Expiration Date:   05/21/2020    Order Specific Question:   Reason for Exam (SYMPTOM  OR DIAGNOSIS REQUIRED)    Answer:   Newly diagnosed DLBCL    Order Specific Question:   Preferred Imaging Location?    Answer:   [facility]  . ECHOCARDIOGRAM COMPLETE    Standing Status:   Future    Standing Expiration Date:   06/20/2020    Order Specific Question:   Where should this test be performed    Answer:   [facility]    Order Specific Question:   Perflutren DEFINITY (image enhancing agent) should be administered unless hypersensitivity or allergy exist    Answer:   Administer Perflutren    Order Specific Question:   Reason for exam-Echo    Answer:   Chemotherapy evaluation  v87.41 / v58.11   All questions were answered. The patient knows to call the clinic with any problems, questions or concerns.  Return in 2 weeks for  labs, port flush and clinic appt.   Tish Men, MD 03/21/2019 9:50 AM   CHIEF COMPLAINTS/PURPOSE OF CONSULTATION:  "My neck is swollen"  HISTORY OF PRESENTING ILLNESS:  Sandra Payne 78 y.o. 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  Patient presented to Dr. Constance Holster of ENT in late 01/2019 for evaluation of 1 month of left-sided neck swelling.  CT neck showed extensive bulky left cervical adenopathy, the largest of which measures 7.1 x  3.9 x 5.8 cm.  She underwent incisional lymph node biopsy, which showed is a large B-cell lymphoma.  Patient was referred to oncology for further evaluation.  Patient reports that she has some hoarseness of voice after the recent incisional wound for biopsy, which is improving.  She feels slightly short winded after speaking long sentences, but denies any significant shortness of breath with exertion or at rest.  She denies any constitutional symptoms, dysphagia or throat tightness.  She denies any other complaint today.  I have reviewed her chart and materials related to her cancer extensively and collaborated history with the patient. Summary of oncologic history is as follows: Oncology History  DLBCL (diffuse large B cell lymphoma) ([place])  02/19/2019 Imaging   CT neck: IMPRESSION: 1. 7 cm left lower neck mass extending into the superior mediastinum most concerning for malignancy. This may reflect a conglomerate nodal mass or primary tumor of uncertain origin. 2. Additional enlarged lymph nodes adjacent to the mass in the left supraclavicular/retroclavicular and left subpectoral regions. 3.  Aortic Atherosclerosis (ICD10-I70.0).   03/12/2019 Procedure   Incisional left cervical LN bx by Dr. Constance Holster   03/12/2019 Pathology Results   Accession: [id number] Soft tissue mass, simple excision, Left Neck - DIFFUSE LARGE B-CELL LYMPHOMA - SEE COMMENT   03/19/2019 Initial Diagnosis   DLBCL (diffuse large B cell lymphoma) [facility])     MEDICAL HISTORY:  Past Medical History:  Diagnosis Date  . Anemia   . Arthralgia   . Arthritis   . Elevated cholesterol   . Insomnia   . Neck mass    left    SURGICAL HISTORY: Past Surgical History:  Procedure Laterality Date  . ABDOMINAL HYSTERECTOMY     partial  . MASS BIOPSY Left 03/12/2019   Procedure: NECK MASS BIOPSY;  Surgeon: Izora Gala, MD;  Location: [facility];  Service: ENT;  Laterality: Left;  . TOTAL KNEE ARTHROPLASTY Right  01/06/2015   Procedure: RIGHT TOTAL KNEE ARTHROPLASTY;  Surgeon: Frederik Pear, MD;  Location: [facility];  Service: Orthopedics;  Laterality: Right;    SOCIAL HISTORY: Social History   Socioeconomic History  . Marital status: Widowed    Spouse name: Not on file  . Number of children: 1  . Years of education: Not on file  . Highest education level: Not on file  Occupational History  . Not on file  Social Needs  . Financial resource strain: Not on file  . Food insecurity    Worry: Not on file    Inability: Not on file  . Transportation needs    Medical: Not on file    Non-medical: Not on file  Tobacco Use  . Smoking status: Former Smoker    Packs/day: 0.25    Years: 2.00    Pack years: 0.50    Types: Cigarettes  . Smokeless tobacco: Never Used  Substance and Sexual Activity  . Alcohol use: Yes    Alcohol/week: 0.0 standard drinks  Comment: occasionally  . Drug use: No  . Sexual activity: Not on file  Lifestyle  . Physical activity    Days per week: Not on file    Minutes per session: Not on file  . Stress: Not on file  Relationships  . Social Herbalist on phone: Not on file    Gets together: Not on file    Attends religious service: Not on file    Active member of club or organization: Not on file    Attends meetings of clubs or organizations: Not on file    Relationship status: Not on file  . Intimate partner violence    Fear of current or ex partner: Not on file    Emotionally abused: Not on file    Physically abused: Not on file    Forced sexual activity: Not on file  Other Topics Concern  . Not on file  Social History Narrative   Lives alone in a 2 story home.  Husband passed away on 09/21/2014.     Works one day a week at [employer].     Retired from [employer].  Exercises regularly.    Patient is right-handed.    FAMILY HISTORY: Family History  Problem Relation Age of Onset  . Diabetes Mother   .  Hypertension Mother   . Breast cancer Neg Hx     ALLERGIES:  is allergic to codeine and sulfa antibiotics.  MEDICATIONS:  Current Outpatient Medications  Medication Sig Dispense Refill  . acetaminophen (TYLENOL) 325 MG tablet Take 650 mg by mouth every 6 (six) hours as needed.    . Ascorbic Acid (VITAMIN C) 100 MG tablet Take 100 mg by mouth daily.    . Celecoxib (CELEBREX PO) Take by mouth.    [name] Kitchen HYDROcodone-acetaminophen (NORCO) 7.5-325 MG tablet Take 1 tablet by mouth every 6 (six) hours as needed for moderate pain. 20 tablet 0  . Multiple Vitamin (MULTIVITAMIN) tablet Take 1 tablet by mouth daily.    . traMADol (ULTRAM) 50 MG tablet Take by mouth every 6 (six) hours as needed.    . vitamin B-12 (CYANOCOBALAMIN) 100 MCG tablet Take 100 mcg by mouth daily.    [name] Kitchen zolpidem (AMBIEN) 5 MG tablet Take 5 mg by mouth at bedtime as needed for sleep.     No current facility-administered medications for this visit.     REVIEW OF SYSTEMS:   Constitutional: ( - ) fevers, ( - )  chills , ( - ) night sweats Eyes: ( - ) blurriness of vision, ( - ) double vision, ( - ) watery eyes Ears, nose, mouth, throat, and face: ( - ) mucositis, ( - ) sore throat Respiratory: ( - ) cough, ( - ) dyspnea, ( - ) wheezes Cardiovascular: ( - ) palpitation, ( - ) chest discomfort, ( - ) lower extremity swelling Gastrointestinal:  ( - ) nausea, ( - ) heartburn, ( - ) change in bowel habits Skin: ( - ) abnormal skin rashes Lymphatics: ( + ) new lymphadenopathy, ( - ) easy bruising Neurological: ( - ) numbness, ( - ) tingling, ( - ) new weaknesses Behavioral/Psych: ( - ) mood change, ( - ) new changes  All other systems were reviewed with the patient and are negative.  PHYSICAL EXAMINATION: ECOG PERFORMANCE STATUS: 1 - Symptomatic but completely ambulatory  Vitals:   03/21/19 0855  BP: 120/80  Pulse: 91  Resp: 18  Temp: 98.3 F (36.8 C)  SpO2: 100%   Filed Weights   03/21/19 0855  Weight: 134 lb 3.2 oz  (60.9 kg)    GENERAL: alert, no distress and comfortable SKIN: skin color, texture, turgor are normal, no rashes or significant lesions EYES: conjunctiva are pink and non-injected, sclera clear OROPHARYNX: no exudate, no erythema; lips, buccal mucosa, and tongue normal  NECK: supple, non-tender LYMPH:  Bulky left cervical adenopathy measuring ~7cm LUNGS: clear to auscultation with normal breathing effort HEART: regular rate & rhythm, no murmurs, no lower extremity edema ABDOMEN: soft, non-tender, non-distended, normal bowel sounds Musculoskeletal: no cyanosis of digits and no clubbing  PSYCH: alert & oriented x 3, fluent speech NEURO: no focal motor/sensory deficits  LABORATORY DATA:  I have reviewed the data as listed Lab Results  Component Value Date   WBC 5.9 03/21/2019   HGB 12.6 03/21/2019   HCT 40.2 03/21/2019   MCV 81.5 03/21/2019   PLT 401 (H) 03/21/2019   Lab Results  Component Value Date   NA 140 03/21/2019   K 4.0 03/21/2019   CL 106 03/21/2019   CO2 23 03/21/2019    RADIOGRAPHIC STUDIES: I have personally reviewed the radiological images as listed and agreed with the findings in the report. Ct Soft Tissue Neck W Contrast  Result Date: 02/19/2019 CLINICAL DATA:  Neck mass. Acute onset left neck and supraclavicular swelling. Creatinine was obtained on site at [facility] at [address] Results: Creatinine 0.7 mg/dL. EXAM: CT NECK WITH CONTRAST TECHNIQUE: Multidetector CT imaging of the neck was performed using the standard protocol following the bolus administration of intravenous contrast. CONTRAST:  11m ISOVUE-300 IOPAMIDOL (ISOVUE-300) INJECTION 61% COMPARISON:  None. FINDINGS: Pharynx and larynx: No evidence of mass or swelling. Patent airway. No retropharyngeal fluid. Salivary glands: No inflammation, mass, or stone. Thyroid: Mass effect on the left thyroid lobe by the below described mass. No focal thyroid abnormality identified. Lymph nodes: A  largely homogeneously enhancing left level IV/supraclavicular neck mass measures 7.1 x 3.9 x 5.8 cm, extends into the superior mediastinum, and encases left common carotid artery which remains patent. The left internal jugular vein is displaced anteriorly, partially encased, and severely compressed but remains patent. The mass compresses the left thyroid lobe but appears separate from it. The thyroid, trachea, and esophagus are displaced rightward. The mass is also partially inseparable from the cervical esophagus although it is not felt to arise from it. Adjacent separate smaller soft tissue masses suggestive of enlarged lymph nodes are noted anterior and lateral to the dominant mass including a 1.9 x 1.5 cm mass/node posterior to the left clavicle (series 2, image 74). A mildly enlarged left subpectoral lymph node measures 10 mm in short axis (series 2, image 85). No enlarged lymph nodes are identified in the right neck. Vascular: Mild atherosclerotic plaque in the aortic arch and carotid arteries. Left common carotid artery encasement as above. Limited intracranial: Unremarkable. Visualized orbits: Unremarkable. Mastoids and visualized paranasal sinuses: Clear. Skeleton: Moderate cervical disc degeneration. No suspicious osseous lesion. Upper chest: No apical lung mass or consolidation. No enlarged lymph nodes in the included portion of the mediastinum (aside from the above described neck mass extending into the mediastinum). Other: None. IMPRESSION: 1. 7 cm left lower neck mass extending into the superior mediastinum most concerning for malignancy. This may reflect a conglomerate nodal mass or primary tumor of uncertain origin. 2. Additional enlarged lymph nodes adjacent to the mass in the left supraclavicular/retroclavicular and left  subpectoral regions. 3.  Aortic Atherosclerosis (ICD10-I70.0). Electronically Signed   By: Logan Bores M.D.   On: 02/19/2019 15:19    PATHOLOGY: I have reviewed the pathology  reports as documented in the oncologist history.

## 2019-03-20 ENCOUNTER — Other Ambulatory Visit: Payer: Self-pay | Admitting: *Deleted

## 2019-03-20 ENCOUNTER — Telehealth: Payer: Self-pay | Admitting: *Deleted

## 2019-03-20 ENCOUNTER — Encounter (HOSPITAL_COMMUNITY): Payer: Self-pay | Admitting: General Surgery

## 2019-03-20 NOTE — Telephone Encounter (Signed)
Oncology Nurse Navigator Documentation  Called Ms. Blye to inform her of tomorrow morning 8:30 lab prior to 9:00 appt with Dr. Maylon Peppers.  She voiced understanding.  [name], RN, BSN Head & Neck Oncology Nurse [place] at [facility] [phone]

## 2019-03-21 ENCOUNTER — Telehealth: Payer: Self-pay | Admitting: *Deleted

## 2019-03-21 ENCOUNTER — Inpatient Hospital Stay: Payer: PPO

## 2019-03-21 ENCOUNTER — Encounter: Payer: Self-pay | Admitting: *Deleted

## 2019-03-21 ENCOUNTER — Inpatient Hospital Stay: Payer: PPO | Attending: Hematology | Admitting: Hematology

## 2019-03-21 ENCOUNTER — Telehealth: Payer: Self-pay | Admitting: Hematology

## 2019-03-21 ENCOUNTER — Other Ambulatory Visit: Payer: Self-pay

## 2019-03-21 ENCOUNTER — Encounter: Payer: Self-pay | Admitting: Hematology

## 2019-03-21 DIAGNOSIS — Z882 Allergy status to sulfonamides status: Secondary | ICD-10-CM

## 2019-03-21 DIAGNOSIS — E079 Disorder of thyroid, unspecified: Secondary | ICD-10-CM | POA: Diagnosis not present

## 2019-03-21 DIAGNOSIS — Z833 Family history of diabetes mellitus: Secondary | ICD-10-CM | POA: Insufficient documentation

## 2019-03-21 DIAGNOSIS — I700 Atherosclerosis of aorta: Secondary | ICD-10-CM

## 2019-03-21 DIAGNOSIS — Z79899 Other long term (current) drug therapy: Secondary | ICD-10-CM | POA: Diagnosis not present

## 2019-03-21 DIAGNOSIS — R7989 Other specified abnormal findings of blood chemistry: Secondary | ICD-10-CM

## 2019-03-21 DIAGNOSIS — C8331 Diffuse large B-cell lymphoma, lymph nodes of head, face, and neck: Secondary | ICD-10-CM

## 2019-03-21 DIAGNOSIS — T451X5A Adverse effect of antineoplastic and immunosuppressive drugs, initial encounter: Secondary | ICD-10-CM | POA: Insufficient documentation

## 2019-03-21 DIAGNOSIS — M5030 Other cervical disc degeneration, unspecified cervical region: Secondary | ICD-10-CM | POA: Diagnosis not present

## 2019-03-21 DIAGNOSIS — C8330 Diffuse large B-cell lymphoma, unspecified site: Secondary | ICD-10-CM | POA: Insufficient documentation

## 2019-03-21 DIAGNOSIS — R221 Localized swelling, mass and lump, neck: Secondary | ICD-10-CM | POA: Diagnosis not present

## 2019-03-21 DIAGNOSIS — Z634 Disappearance and death of family member: Secondary | ICD-10-CM

## 2019-03-21 DIAGNOSIS — D473 Essential (hemorrhagic) thrombocythemia: Secondary | ICD-10-CM | POA: Insufficient documentation

## 2019-03-21 DIAGNOSIS — Z8249 Family history of ischemic heart disease and other diseases of the circulatory system: Secondary | ICD-10-CM | POA: Diagnosis not present

## 2019-03-21 DIAGNOSIS — R490 Dysphonia: Secondary | ICD-10-CM | POA: Insufficient documentation

## 2019-03-21 DIAGNOSIS — Z885 Allergy status to narcotic agent status: Secondary | ICD-10-CM | POA: Diagnosis not present

## 2019-03-21 DIAGNOSIS — Z87891 Personal history of nicotine dependence: Secondary | ICD-10-CM

## 2019-03-21 DIAGNOSIS — D6959 Other secondary thrombocytopenia: Secondary | ICD-10-CM | POA: Insufficient documentation

## 2019-03-21 DIAGNOSIS — D75839 Thrombocytosis, unspecified: Secondary | ICD-10-CM

## 2019-03-21 LAB — CBC WITH DIFFERENTIAL (CANCER CENTER ONLY)
Abs Immature Granulocytes: 0.04 10*3/uL (ref 0.00–0.07)
Basophils Absolute: 0 10*3/uL (ref 0.0–0.1)
Basophils Relative: 0 %
Eosinophils Absolute: 0.2 10*3/uL (ref 0.0–0.5)
Eosinophils Relative: 3 %
HCT: 40.2 % (ref 36.0–46.0)
Hemoglobin: 12.6 g/dL (ref 12.0–15.0)
Immature Granulocytes: 1 %
Lymphocytes Relative: 24 %
Lymphs Abs: 1.4 10*3/uL (ref 0.7–4.0)
MCH: 25.6 pg — ABNORMAL LOW (ref 26.0–34.0)
MCHC: 31.3 g/dL (ref 30.0–36.0)
MCV: 81.5 fL (ref 80.0–100.0)
Monocytes Absolute: 0.7 10*3/uL (ref 0.1–1.0)
Monocytes Relative: 12 %
Neutro Abs: 3.5 10*3/uL (ref 1.7–7.7)
Neutrophils Relative %: 60 %
Platelet Count: 401 10*3/uL — ABNORMAL HIGH (ref 150–400)
RBC: 4.93 MIL/uL (ref 3.87–5.11)
RDW: 15.4 % (ref 11.5–15.5)
WBC Count: 5.9 10*3/uL (ref 4.0–10.5)
nRBC: 0 % (ref 0.0–0.2)

## 2019-03-21 LAB — CMP (CANCER CENTER ONLY)
ALT: 18 U/L (ref 0–44)
AST: 20 U/L (ref 15–41)
Albumin: 3.7 g/dL (ref 3.5–5.0)
Alkaline Phosphatase: 77 U/L (ref 38–126)
Anion gap: 11 (ref 5–15)
BUN: 12 mg/dL (ref 8–23)
CO2: 23 mmol/L (ref 22–32)
Calcium: 9.2 mg/dL (ref 8.9–10.3)
Chloride: 106 mmol/L (ref 98–111)
Creatinine: 0.77 mg/dL (ref 0.44–1.00)
GFR, Est AFR Am: >60 mL/min (ref >60)
GFR, Estimated: >60 mL/min (ref >60)
Glucose, Bld: 103 mg/dL — ABNORMAL HIGH (ref 70–99)
Potassium: 4 mmol/L (ref 3.5–5.1)
Sodium: 140 mmol/L (ref 135–145)
Total Bilirubin: 0.4 mg/dL (ref 0.3–1.2)
Total Protein: 7.7 g/dL (ref 6.5–8.1)

## 2019-03-21 LAB — URIC ACID: Uric Acid, Serum: 7.1 mg/dL (ref 2.5–7.1)

## 2019-03-21 LAB — LACTATE DEHYDROGENASE: LDH: 267 U/L — ABNORMAL HIGH (ref 98–192)

## 2019-03-21 NOTE — Telephone Encounter (Signed)
Gave avs and calendar ° °

## 2019-03-21 NOTE — Telephone Encounter (Signed)
Oncology Nurse Navigator Documentation  Returned call to [name], confirmed I had spoken with her granddaughter, LVMM for son to call me tomorrow. I confirmed her understanding of echocardiogram appt 11:00 this Friday, PAC placement 11:00 8/5, PET 10:30 8/6.  I reviewed registration/arrival procedures.  [name], RN, BSN Head & Neck Oncology Nurse [place] at [place] [phone]

## 2019-03-21 NOTE — Telephone Encounter (Signed)
Oncology Nurse Navigator Documentation  Per Ms. Terry's request, called her son and granddaughter to update them on her diagnosis, upcoming procedures, tentative treatment plan.  Spoke with granddaughter, LVMM for son asking him to return my call.    [name], RN, BSN Head & Neck Oncology Nurse [place] at [place] [phone]

## 2019-03-21 NOTE — Progress Notes (Addendum)
Oncology Nurse Navigator Documentation  Met with Ms. Totino during initial consult with Dr. Maylon Peppers.  . Further introduced myself as her Navigator, explained my role as a member of the Care Team. . Provided New Patient Information packet: o Contact information for physician, this navigator, other members of the Care Team o Advance Directive information ([place] blue pamphlet with LCSW insert); provided [facility] AD booklet at her request, encouraged her to contact Gwinda Maine, LCSW, to complete. o Fall Prevention Patient [place] sheet o Symptom Management Clinic information o [facility] campus map with highlight of [place] . Provided and discussed educational handout for [facility], showed her example. . She voiced understanding of Dr. Lorette Ang tentative plans for treatment, the need for PET, echocardiogram, PAC and possible bone marrow bx. . Assisted with post-consult appt scheduling. . Showed her location of [facility] and Registration for pending appts, explained arrival procedures.   . She verbalized understanding of information provided, asked that her son and granddaughter be called with update per today's visit.  I agreed to do so. I encouraged her to call me with questions/concerns.  [name], RN, BSN Head & Neck Oncology Nurse [place] at [place] [phone]

## 2019-03-21 NOTE — Patient Instructions (Signed)
Non-Hodgkin Lymphoma, Adult Non-Hodgkin lymphoma (NHL) is a cancer of the lymphatic system. The lymphatic system is part of the body's defense (immune) system. It protects the body from:  Infections.  Germs.  Diseases. NHL affects a type of white blood cell called lymphocytes. There are many different types of NHL. NHL can occur in the B lymphocytes (B cells), T lymphocytes (T cells), and natural killer (NK) cells. The kind of NHL you have depends on the type of cells it affects and how quickly it grows and spreads. What are the causes? The cause of this condition is not known. What increases the risk? You are more likely to develop this condition if:  You have a weak immune system, especially after receiving an organ from a donor (transplant).  You have an autoimmune disease like rheumatoid arthritis.  You have infections from bacteria or viruses. These include: ? Human immunodeficiency virus (HIV). ? Epstein-Barr virus.  You are over the age of 87.  You are female.  You are Caucasian.  You have been treated with high-energy beams that destroy cancer cells(radiation therapy) for other cancers. What are the signs or symptoms? Symptoms of this condition depend on the type, where it is in the body, and whether it is fast-growing or slow-growing. Some people may not have any symptoms. Common symptoms include:  Swelling of the collections of tissue that filter bacteria, viruses, and waste from the bloodstream (lymph nodes) in your neck, armpits, or groin.  Fever.  Unexplained weight loss.  Night sweats.  Tiredness (fatigue). How is this diagnosed? This condition may be diagnosed based on:  A physical exam and medical history.  Chest X-ray.  Testing of: ? Tissue from your lymph gland or bone marrow (biopsy). ? Spinal fluid (lumbar puncture or spinal tap). ? Abdominal or chest fluid. ? Blood. ? Urine.  Imaging tests, such as: ? CT scan. ? PET scan (positron emission  tomography). ? MRI. Your health care provider will do more tests to determine whether the cancer has spread, where it has spread to, and how severe it is. This is called staging. You may need to have other tests to look for certain proteins or gene mutations that may help with treatment planning. How is this treated? Treatment for this condition depends on your symptoms, the type and stage of NHL, and how fast it is spreading. Treatment may include:  Radiation therapy.  Chemotherapy. This uses medicine to destroy cancer cells.  Biological therapy. This helps to control the spread of cancer by boosting the body's immune system.  Targeted medicines to treat specific proteins or gene mutations that are present in your lymphoma.  Bone marrow or stem cell transplantation. In this procedure, you receive high doses of chemotherapy followed by a healthy bone marrow or stem cell transplant from a donor.  Participating in clinical trials to see if new (experimental) treatments are effective.  Surgery to remove the lymphoma.  Blood or platelets from a donor (transfusion). This may be done if your blood counts are low. Follow these instructions at home: Medicines  Take over-the-counter and prescription medicines only as told by your health care provider.  Do not take dietary supplements or herbal medicines unless your health care provider tells you to take them. Some supplements can interfere with how well your treatment works. Lifestyle  Get enough sleep. Most adults need 6-8 hours of sleep each night. During treatment, you may need more sleep.  Consider joining a cancer support group. Ask your health  care provider for more information about local and online support groups. General instructions   Drink enough fluid to keep your urine pale yellow.  Wash your hands often with soap and water. If soap and water are not available, use hand sanitizer.  Tell your cancer care team if you develop  side effects from treatment. They may be able to recommend ways to manage them.  Keep all follow-up visits as told by your health care provider. This is important. Where to find more information  American Cancer Society: www.cancer.org  Leukemia and Lymphoma Society: PreviewPal.pl  National Cancer Institute ([place]): www.cancer.gov Contact a health care provider if you:  Have new lumps under your arms, on your neck, or near your groin.  Have painful lymph nodes.  Have nausea or vomiting.  Have constipation or diarrhea.  Have trouble urinating or painful urination.  Have a skin rash.  Have abdominal pain.  Have joint pain.  Feel dizzy or light-headed.  Have a fever or chills. Get help right away if you:  Have trouble breathing or chest pain.  Have blood in your urine or stool. Summary  Non-Hodgkin lymphoma (NHL) is a cancer of the lymphatic system. The lymphatic system is part of your body's defense (immune) system.  Treatment for this condition depends on your symptoms, the type and stage of NHL, and how fast it is spreading.  Tell your cancer care team if you develop side effects from treatment. They may be able to recommend ways to manage them.  Contact your health care provider if you have new lumps under your arms, on your neck, or near your groin. This information is not intended to replace advice given to you by your health care provider. Make sure you discuss any questions you have with your health care provider. Document Released: 02/20/2007 Document Revised: 10/26/2018 Document Reviewed: 10/26/2018 Elsevier Patient Education  2020 [employer].

## 2019-03-22 ENCOUNTER — Telehealth: Payer: Self-pay | Admitting: *Deleted

## 2019-03-22 LAB — HEPATITIS B CORE ANTIBODY, TOTAL: Hep B Core Total Ab: NEGATIVE

## 2019-03-22 LAB — HIV ANTIBODY (ROUTINE TESTING W REFLEX): HIV Screen 4th Generation wRfx: NONREACTIVE

## 2019-03-22 LAB — HCV COMMENT:

## 2019-03-22 LAB — HEPATITIS C ANTIBODY (REFLEX): HCV Ab: <0.1 s/co ratio (ref 0.0–0.9)

## 2019-03-22 LAB — HEPATITIS B SURFACE ANTIGEN: Hepatitis B Surface Ag: NEGATIVE

## 2019-03-22 LAB — HEPATITIS B SURFACE ANTIBODY,QUALITATIVE: Hep B S Ab: NONREACTIVE

## 2019-03-22 NOTE — Telephone Encounter (Addendum)
Oncology Nurse Navigator Documentation  Returned call to Ms. Scalici's son.  Per patient's request, I provided him overview of her diagnosis, pending diagnostic tests, procedures.  I later sent email with attachment re Non-Hodgkin's lymphoma, PAC educational handout.  [name], RN, BSN Head & Neck Oncology Nurse [place] at [place] [phone]

## 2019-03-23 ENCOUNTER — Other Ambulatory Visit: Payer: Self-pay

## 2019-03-23 ENCOUNTER — Ambulatory Visit (HOSPITAL_COMMUNITY)
Admission: RE | Admit: 2019-03-23 | Discharge: 2019-03-23 | Disposition: A | Discharge status: Home / Self Care | Payer: PPO | Source: Ambulatory Visit | Attending: Hematology | Admitting: Hematology

## 2019-03-23 DIAGNOSIS — I358 Other nonrheumatic aortic valve disorders: Secondary | ICD-10-CM | POA: Diagnosis not present

## 2019-03-23 DIAGNOSIS — C8331 Diffuse large B-cell lymphoma, lymph nodes of head, face, and neck: Secondary | ICD-10-CM | POA: Diagnosis not present

## 2019-03-23 NOTE — Progress Notes (Signed)
  Echocardiogram 2D Echocardiogram has been performed.  Jennette Dubin 03/23/2019, 11:49 AM

## 2019-03-26 ENCOUNTER — Other Ambulatory Visit: Payer: Self-pay | Admitting: Radiology

## 2019-03-26 ENCOUNTER — Telehealth: Payer: Self-pay | Admitting: *Deleted

## 2019-03-26 NOTE — Telephone Encounter (Signed)
Oncology Nurse Navigator Documentation  Returned Ms. Johndrow's call, provided clarification for [facility] appt, Thurs's PET.  [name], RN, BSN Head & Neck Oncology Nurse [place] at [place] [phone]

## 2019-03-28 ENCOUNTER — Other Ambulatory Visit: Payer: Self-pay | Admitting: Hematology

## 2019-03-28 ENCOUNTER — Ambulatory Visit (HOSPITAL_COMMUNITY)
Admission: RE | Admit: 2019-03-28 | Discharge: 2019-03-28 | Disposition: A | Discharge status: Home / Self Care | Payer: PPO | Source: Ambulatory Visit | Attending: Hematology | Admitting: Hematology

## 2019-03-28 ENCOUNTER — Other Ambulatory Visit: Payer: Self-pay

## 2019-03-28 ENCOUNTER — Encounter (HOSPITAL_COMMUNITY): Payer: Self-pay

## 2019-03-28 DIAGNOSIS — G4700 Insomnia, unspecified: Secondary | ICD-10-CM | POA: Insufficient documentation

## 2019-03-28 DIAGNOSIS — C8331 Diffuse large B-cell lymphoma, lymph nodes of head, face, and neck: Secondary | ICD-10-CM | POA: Diagnosis not present

## 2019-03-28 DIAGNOSIS — Z791 Long term (current) use of non-steroidal anti-inflammatories (NSAID): Secondary | ICD-10-CM | POA: Diagnosis not present

## 2019-03-28 DIAGNOSIS — Z882 Allergy status to sulfonamides status: Secondary | ICD-10-CM | POA: Insufficient documentation

## 2019-03-28 DIAGNOSIS — Z87891 Personal history of nicotine dependence: Secondary | ICD-10-CM | POA: Diagnosis not present

## 2019-03-28 DIAGNOSIS — Z79899 Other long term (current) drug therapy: Secondary | ICD-10-CM | POA: Diagnosis not present

## 2019-03-28 DIAGNOSIS — M1990 Unspecified osteoarthritis, unspecified site: Secondary | ICD-10-CM | POA: Insufficient documentation

## 2019-03-28 DIAGNOSIS — E7800 Pure hypercholesterolemia, unspecified: Secondary | ICD-10-CM | POA: Diagnosis not present

## 2019-03-28 DIAGNOSIS — Z885 Allergy status to narcotic agent status: Secondary | ICD-10-CM | POA: Insufficient documentation

## 2019-03-28 DIAGNOSIS — Z452 Encounter for adjustment and management of vascular access device: Secondary | ICD-10-CM | POA: Diagnosis not present

## 2019-03-28 HISTORY — PX: IR IMAGING GUIDED PORT INSERTION: IMG5740

## 2019-03-28 LAB — CBC WITH DIFFERENTIAL/PLATELET
Abs Immature Granulocytes: 0.03 10*3/uL (ref 0.00–0.07)
Basophils Absolute: 0 10*3/uL (ref 0.0–0.1)
Basophils Relative: 0 %
Eosinophils Absolute: 0.1 10*3/uL (ref 0.0–0.5)
Eosinophils Relative: 2 %
HCT: 41.6 % (ref 36.0–46.0)
Hemoglobin: 12.7 g/dL (ref 12.0–15.0)
Immature Granulocytes: 1 %
Lymphocytes Relative: 26 %
Lymphs Abs: 1.7 10*3/uL (ref 0.7–4.0)
MCH: 25.6 pg — ABNORMAL LOW (ref 26.0–34.0)
MCHC: 30.5 g/dL (ref 30.0–36.0)
MCV: 83.7 fL (ref 80.0–100.0)
Monocytes Absolute: 0.8 10*3/uL (ref 0.1–1.0)
Monocytes Relative: 12 %
Neutro Abs: 3.9 10*3/uL (ref 1.7–7.7)
Neutrophils Relative %: 59 %
Platelets: 403 10*3/uL — ABNORMAL HIGH (ref 150–400)
RBC: 4.97 MIL/uL (ref 3.87–5.11)
RDW: 15.5 % (ref 11.5–15.5)
WBC: 6.6 10*3/uL (ref 4.0–10.5)
nRBC: 0 % (ref 0.0–0.2)

## 2019-03-28 LAB — PROTIME-INR
INR: 0.9 (ref 0.8–1.2)
Prothrombin Time: 12.1 seconds (ref 11.4–15.2)

## 2019-03-28 MED ORDER — MIDAZOLAM HCL 2 MG/2ML IJ SOLN
INTRAMUSCULAR | Status: AC | PRN
  Administered 2019-03-28 (×2): 1 mg via INTRAVENOUS

## 2019-03-28 MED ORDER — CEFAZOLIN SODIUM-DEXTROSE 2-4 GM/100ML-% IV SOLN
INTRAVENOUS | Status: AC
  Administered 2019-03-28: 13:00:00 2 g via INTRAVENOUS
  Filled 2019-03-28: qty 100

## 2019-03-28 MED ORDER — CEFAZOLIN SODIUM-DEXTROSE 2-4 GM/100ML-% IV SOLN
2.0000 g | INTRAVENOUS | Status: AC
  Administered 2019-03-28: 13:00:00 2 g via INTRAVENOUS

## 2019-03-28 MED ORDER — LIDOCAINE-EPINEPHRINE (PF) 2 %-1:200000 IJ SOLN: INTRAMUSCULAR | Status: AC | PRN

## 2019-03-28 MED ORDER — FENTANYL CITRATE (PF) 100 MCG/2ML IJ SOLN
INTRAMUSCULAR | Status: AC
  Filled 2019-03-28: qty 2

## 2019-03-28 MED ORDER — SODIUM CHLORIDE 0.9 % IV SOLN
INTRAVENOUS | Status: DC
  Administered 2019-03-28: 11:00:00 via INTRAVENOUS

## 2019-03-28 MED ORDER — ONDANSETRON HCL 8 MG PO TABS: 8.0000 mg | ORAL_TABLET | Freq: Two times a day (BID) | ORAL | 1 refills | Status: DC | PRN

## 2019-03-28 MED ORDER — HEPARIN SOD (PORK) LOCK FLUSH 100 UNIT/ML IV SOLN
INTRAVENOUS | Status: AC
  Filled 2019-03-28: qty 5

## 2019-03-28 MED ORDER — PREDNISONE 20 MG PO TABS: 100.0000 mg | ORAL_TABLET | Freq: Every day | ORAL | 5 refills | Status: AC

## 2019-03-28 MED ORDER — LIDOCAINE-EPINEPHRINE (PF) 1 %-1:200000 IJ SOLN
INTRAMUSCULAR | Status: AC | PRN
  Administered 2019-03-28 (×2): 10 mL

## 2019-03-28 MED ORDER — MIDAZOLAM HCL 2 MG/2ML IJ SOLN
INTRAMUSCULAR | Status: AC
  Filled 2019-03-28: qty 2

## 2019-03-28 MED ORDER — FENTANYL CITRATE (PF) 100 MCG/2ML IJ SOLN
INTRAMUSCULAR | Status: AC | PRN
  Administered 2019-03-28 (×2): 50 ug via INTRAVENOUS

## 2019-03-28 MED ORDER — ALLOPURINOL 300 MG PO TABS: 300.0000 mg | ORAL_TABLET | Freq: Every day | ORAL | 3 refills | Status: DC

## 2019-03-28 MED ORDER — LIDOCAINE-EPINEPHRINE (PF) 2 %-1:200000 IJ SOLN
INTRAMUSCULAR | Status: AC
  Filled 2019-03-28: qty 20

## 2019-03-28 MED ORDER — PROCHLORPERAZINE MALEATE 10 MG PO TABS: 10.0000 mg | ORAL_TABLET | Freq: Four times a day (QID) | ORAL | 6 refills | Status: DC | PRN

## 2019-03-28 MED ORDER — LIDOCAINE-PRILOCAINE 2.5-2.5 % EX CREA: TOPICAL_CREAM | CUTANEOUS | 3 refills | Status: DC

## 2019-03-28 NOTE — Discharge Instructions (Signed)
Leave dressing in place for 24 hours.  After 24 hours you may remove the dressing and then shower.  No shower for the first 24 hours. Watch for signs and symptoms of infection: oozing at site, redness at site, swelling at site, fever over 100.4, call cancer center MD for these problems.   Do not use the numbing cream for the first two weeks.  It interferes with proper healing at the port site.  Once the site has healed and the dermabond glue has flaked off you may begin using the numbing cream.   No driving/operating machinery, no legal decisions for 24 hours.    Moderate Conscious Sedation, Adult, Care After These instructions provide you with information about caring for yourself after your procedure. Your health care provider may also give you more specific instructions. Your treatment has been planned according to current medical practices, but problems sometimes occur. Call your health care provider if you have any problems or questions after your procedure. What can I expect after the procedure? After your procedure, it is common:  To feel sleepy for several hours.  To feel clumsy and have poor balance for several hours.  To have poor judgment for several hours.  To vomit if you eat too soon. Follow these instructions at home: For at least 24 hours after the procedure:   Do not: ? Participate in activities where you could fall or become injured. ? Drive. ? Use heavy machinery. ? Drink alcohol. ? Take sleeping pills or medicines that cause drowsiness. ? Make important decisions or sign legal documents. ? Take care of children on your own.  Rest. Eating and drinking  Follow the diet recommended by your health care provider.  If you vomit: ? Drink water, juice, or soup when you can drink without vomiting. ? Make sure you have little or no nausea before eating solid foods. General instructions  Have a responsible adult stay with you until you are awake and alert.  Take  over-the-counter and prescription medicines only as told by your health care provider.  If you smoke, do not smoke without supervision.  Keep all follow-up visits as told by your health care provider. This is important. Contact a health care provider if:  You keep feeling nauseous or you keep vomiting.  You feel light-headed.  You develop a rash.  You have a fever. Get help right away if:  You have trouble breathing. This information is not intended to replace advice given to you by your health care provider. Make sure you discuss any questions you have with your health care provider. Document Released: 05/30/2013 Document Revised: 07/22/2017 Document Reviewed: 11/29/2015 Elsevier Patient Education  [place] Insertion, Care After This sheet gives you information about how to care for yourself after your procedure. Your health care provider may also give you more specific instructions. If you have problems or questions, contact your health care provider. What can I expect after the procedure? After the procedure, it is common to have:  Discomfort at the port insertion site.  Bruising on the skin over the port. This should improve over 3-4 days. Follow these instructions at home: [facility] care  After your port is placed, you will get a manufacturer's information card. The card has information about your port. Keep this card with you at all times.  Take care of the port as told by your health care provider. Ask your health care provider if you or a family member can get training  for taking care of the port at home. A home health care nurse may also take care of the port.  Make sure to remember what type of port you have. Incision care      Follow instructions from your health care provider about how to take care of your port insertion site. Make sure you: ? Wash your hands with soap and water before and after you change your bandage (dressing). If soap and  water are not available, use hand sanitizer. ? Change your dressing as told by your health care provider. ? Leave stitches (sutures), skin glue, or adhesive strips in place. These skin closures may need to stay in place for 2 weeks or longer. If adhesive strip edges start to loosen and curl up, you may trim the loose edges. Do not remove adhesive strips completely unless your health care provider tells you to do that.  Check your port insertion site every day for signs of infection. Check for: ? Redness, swelling, or pain. ? Fluid or blood. ? Warmth. ? Pus or a bad smell. Activity  Return to your normal activities as told by your health care provider. Ask your health care provider what activities are safe for you.  Do not lift anything that is heavier than 10 lb (4.5 kg), or the limit that you are told, until your health care provider says that it is safe. General instructions  Take over-the-counter and prescription medicines only as told by your health care provider.  Do not take baths, swim, or use a hot tub until your health care provider approves. Ask your health care provider if you may take showers. You may only be allowed to take sponge baths.  Do not drive for 24 hours if you were given a sedative during your procedure.  Wear a medical alert bracelet in case of an emergency. This will tell any health care providers that you have a port.  Keep all follow-up visits as told by your health care provider. This is important. Contact a health care provider if:  You cannot flush your port with saline as directed, or you cannot draw blood from the port.  You have a fever or chills.  You have redness, swelling, or pain around your port insertion site.  You have fluid or blood coming from your port insertion site.  Your port insertion site feels warm to the touch.  You have pus or a bad smell coming from the port insertion site. Get help right away if:  You have chest pain or  shortness of breath.  You have bleeding from your port that you cannot control. Summary  Take care of the port as told by your health care provider. Keep the manufacturer's information card with you at all times.  Change your dressing as told by your health care provider.  Contact a health care provider if you have a fever or chills or if you have redness, swelling, or pain around your port insertion site.  Keep all follow-up visits as told by your health care provider. This information is not intended to replace advice given to you by your health care provider. Make sure you discuss any questions you have with your health care provider. Document Released: 05/30/2013 Document Revised: 03/07/2018 Document Reviewed: 03/07/2018 Elsevier Patient Education  [place].

## 2019-03-28 NOTE — Consult Note (Signed)
Chief Complaint: Patient was seen in consultation today for   Referring Physician(s): Zhao,Yan  Supervising Physician: Arne Cleveland  Patient Status: [place]  History of Present Illness: Sandra Payne is a 78 y.o. 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  Past Medical History:  Diagnosis Date  . Anemia   . Arthralgia   . Arthritis   . Elevated cholesterol   . Insomnia   . Neck mass    left    Past Surgical History:  Procedure Laterality Date  . ABDOMINAL HYSTERECTOMY     partial  . MASS BIOPSY Left 03/12/2019   Procedure: NECK MASS BIOPSY;  Surgeon: Izora Gala, MD;  Location: [place];  Service: ENT;  Laterality: Left;  . TOTAL KNEE ARTHROPLASTY Right 01/06/2015   Procedure: RIGHT TOTAL KNEE ARTHROPLASTY;  Surgeon: Frederik Pear, MD;  Location: [place];  Service: Orthopedics;  Laterality: Right;    Allergies: Codeine and Sulfa antibiotics  Medications: Prior to Admission medications   Medication Sig Start Date End Date Taking? Authorizing Provider  acetaminophen (TYLENOL) 325 MG tablet Take 650 mg by mouth every 6 (six) hours as needed.    [provider]  allopurinol (ZYLOPRIM) 300 MG tablet Take 1 tablet (300 mg total) by mouth daily. 03/28/19   Tish Men, MD  Ascorbic Acid (VITAMIN C) 100 MG tablet Take 100 mg by mouth daily.    [provider]  Celecoxib (CELEBREX PO) Take by mouth.    [provider]  HYDROcodone-acetaminophen (NORCO) 7.5-325 MG tablet Take 1 tablet by mouth every 6 (six) hours as needed for moderate pain. 03/12/19   Izora Gala, MD  lidocaine-prilocaine (EMLA) cream Apply to affected area once 03/28/19   Tish Men, MD  Multiple Vitamin (MULTIVITAMIN) tablet Take 1 tablet by mouth daily.    [provider]  ondansetron (ZOFRAN) 8 MG tablet Take 1 tablet (8 mg total) by mouth 2  (two) times daily as needed for refractory nausea / vomiting. Start on day 3 after cyclophosphamide chemotherapy. 03/28/19   Tish Men, MD  predniSONE (DELTASONE) 20 MG tablet Take 5 tablets (100 mg total) by mouth daily for 5 days. Take on days 1-5 of chemotherapy. 03/28/19 04/02/19  Tish Men, MD  prochlorperazine (COMPAZINE) 10 MG tablet Take 1 tablet (10 mg total) by mouth every 6 (six) hours as needed (Nausea or vomiting). 03/28/19   Tish Men, MD  traMADol Veatrice Bourbon) 50 MG tablet Take by mouth every 6 (six) hours as needed.    [provider]  vitamin B-12 (CYANOCOBALAMIN) 100 MCG tablet Take 100 mcg by mouth daily.    [provider]  zolpidem (AMBIEN) 5 MG tablet Take 5 mg by mouth at bedtime as needed for sleep.    [provider]     Family History  Problem Relation Age of Onset  . Diabetes Mother   . Hypertension Mother   . Breast cancer Neg Hx     Social History   Socioeconomic History  . Marital status: Widowed    Spouse name: Not on file  . Number of children: 1  . Years of education: Not on file  . Highest education level: Not on file  Occupational History  . Not on file  Social Needs  . Financial resource strain: Not on file  . Food insecurity    Worry: Not on file    Inability:  Not on file  . Transportation needs    Medical: Not on file    Non-medical: Not on file  Tobacco Use  . Smoking status: Former Smoker    Packs/day: 0.25    Years: 2.00    Pack years: 0.50    Types: Cigarettes  . Smokeless tobacco: Never Used  Substance and Sexual Activity  . Alcohol use: Yes    Alcohol/week: 0.0 standard drinks    Comment: occasionally  . Drug use: No  . Sexual activity: Not on file  Lifestyle  . Physical activity    Days per week: Not on file    Minutes per session: Not on file  . Stress: Not on file  Relationships  . Social Herbalist on phone: Not on file    Gets together: Not on file    Attends religious service: Not on  file    Active member of club or organization: Not on file    Attends meetings of clubs or organizations: Not on file    Relationship status: Not on file  Other Topics Concern  . Not on file  Social History Narrative   Lives alone in a 2 story home.  Husband passed away on 14-Sep-2014.     Works one day a week at [employer].     Retired from [employer].  Exercises regularly.    Patient is right-handed.      Review of Systems denies fever, headache, chest pain, dyspnea, cough, abdominal/back pain, nausea, vomiting or bleeding.  She is anxious.  Vital Signs: Blood pressure 146/79, heart rate 90, temp 98.3, respirations 18, O2 sat 98% room air   Physical Exam awake, alert.  Chest clear to auscultation bilaterally.  Heart with regular rate and rhythm.  Abdomen soft, positive bowel sounds, nontender.  No lower extremity edema. Significant left cervical adenopathy, mildly tender to palpation  Imaging: No results found.  Labs:  CBC: Recent Labs    03/21/19 0819  WBC 5.9  HGB 12.6  HCT 40.2  PLT 401*    COAGS: No results for input(s): INR, APTT in the last 8760 hours.  BMP: Recent Labs    03/21/19 0819  NA 140  K 4.0  CL 106  CO2 23  GLUCOSE 103*  BUN 12  CALCIUM 9.2  CREATININE 0.77  GFRNONAA >60  GFRAA >60    LIVER FUNCTION TESTS: Recent Labs    03/21/19 0819  BILITOT 0.4  AST 20  ALT 18  ALKPHOS 77  PROT 7.7  ALBUMIN 3.7    TUMOR MARKERS: No results for input(s): AFPTM, CEA, CA199, CHROMGRNA in the last 8760 hours.  Assessment and Plan: 78 y.o. 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   All of the patient's questions were answered, patient is agreeable to proceed. Consent signed and in chart.  LABS PENDING    Thank you for this interesting consult.  I greatly enjoyed meeting SHUNTEL FISHBURN and look forward to participating in their care.  A copy of this report was sent to the requesting provider on this date.  Electronically Signed: D. Rowe Robert, PA-C 03/28/2019, 10:47 AM   I spent a total of  25 minutes  in face to face in clinical consultation, greater than 50% of which was counseling/coordinating care for Port-A-Cath placement

## 2019-03-28 NOTE — Progress Notes (Signed)
START ON PATHWAY REGIMEN - Lymphoma and CLL     A cycle is every 21 days:     Prednisone      Rituximab-xxxx      Cyclophosphamide      Doxorubicin      Vincristine   **Always confirm dose/schedule in your pharmacy ordering system**  Patient Characteristics: Diffuse Large B-Cell Lymphoma or Follicular Lymphoma, Grade 3B, First Line, Stage III and IV Disease Type: Not Applicable Disease Type: Diffuse Large B-Cell Lymphoma Disease Type: Not Applicable Line of therapy: First Line [place] Stage: Unknown Intent of Therapy: Curative Intent, Discussed with Patient 

## 2019-03-28 NOTE — Procedures (Signed)
  Procedure: R IJ Port catheter placement   EBL:   minimal Complications:  none immediate  See full dictation in [place] PACS.  D. Trayvion Embleton MD Main # [phone] Pager  [phone]    

## 2019-03-29 ENCOUNTER — Ambulatory Visit (HOSPITAL_COMMUNITY)
Admission: RE | Admit: 2019-03-29 | Discharge: 2019-03-29 | Disposition: A | Discharge status: Home / Self Care | Payer: PPO | Source: Ambulatory Visit | Attending: Hematology | Admitting: Hematology

## 2019-03-29 DIAGNOSIS — I700 Atherosclerosis of aorta: Secondary | ICD-10-CM | POA: Diagnosis not present

## 2019-03-29 DIAGNOSIS — I2510 Atherosclerotic heart disease of native coronary artery without angina pectoris: Secondary | ICD-10-CM | POA: Insufficient documentation

## 2019-03-29 DIAGNOSIS — C8590 Non-Hodgkin lymphoma, unspecified, unspecified site: Secondary | ICD-10-CM | POA: Diagnosis not present

## 2019-03-29 DIAGNOSIS — C8331 Diffuse large B-cell lymphoma, lymph nodes of head, face, and neck: Secondary | ICD-10-CM | POA: Insufficient documentation

## 2019-03-29 LAB — GLUCOSE, CAPILLARY: Glucose-Capillary: 89 mg/dL (ref 70–99)

## 2019-03-29 MED ORDER — FLUDEOXYGLUCOSE F - 18 (FDG) INJECTION
6.3900 | Freq: Once | INTRAVENOUS | Status: AC | PRN
  Administered 2019-03-29: 6.39 via INTRAVENOUS

## 2019-03-30 ENCOUNTER — Telehealth: Payer: Self-pay | Admitting: *Deleted

## 2019-03-30 NOTE — Telephone Encounter (Signed)
Oncology Nurse Navigator Documentation  Rec'd call from Ms. Sandra Payne, she asked:  Is bone marrow bx still on schedule for next Tuesday morning.  I indicated I'd get back with her.  If she should cancel mammogram scheduled for 8/27 d/t discomfort at site of Sandra Payne insertion.  I assured her site will be adequately healed, I did not think she needed to cancel.  She agreed to confirm with Dr. Maylon Payne during next Sandra Payne appt. I provided guidance for removal of Tegaderm and 2x2 guaze covering PAC.  Sandra Orem, RN, BSN Head & Neck Oncology Nurse [place] at [place] [phone]

## 2019-03-30 NOTE — Telephone Encounter (Signed)
Spoke with pt and informed pt of PET scan results as per Dr. Lorette Ang instructions.  Confirmed date and time for BM biopsy with pt.  Pt voiced understanding.

## 2019-03-30 NOTE — Telephone Encounter (Signed)
-----   Message from Tish Men, MD sent at 03/29/2019  4:23 PM EDT ----- Hi Thu,  Can you let Ms. Mckiernan know that PET didn't show any bony involvement, so we will move forward with bone marrow bx as scheduled?  Thanks.  [place] ----- Message ----- From: Interface, Rad Results In Sent: 03/29/2019   1:38 PM EDT To: Tish Men, MD

## 2019-04-02 ENCOUNTER — Telehealth: Payer: Self-pay | Admitting: *Deleted

## 2019-04-02 ENCOUNTER — Telehealth: Payer: Self-pay | Admitting: Hematology

## 2019-04-02 ENCOUNTER — Other Ambulatory Visit: Payer: Self-pay | Admitting: Hematology

## 2019-04-02 NOTE — Telephone Encounter (Signed)
Oncology Nurse Navigator Documentation  Rec'd call from Sandra Payne.  Provided clarification of this week's appointments.    Explained lab/port flush scheduled for Wed prior to 8:30 appt with Dr. Maylon Peppers will be conducted as part of bone marrow bx tomorrow morning.  Explained location/duration of Wed's first infusion. She voiced understanding.  Sandra Orem, RN, BSN Head & Neck Oncology Nurse [place] at [place] [phone]

## 2019-04-02 NOTE — Telephone Encounter (Signed)
Scheduled appt per 8/05 sch message - pt aware of appt date and time   

## 2019-04-03 ENCOUNTER — Inpatient Hospital Stay: Payer: PPO

## 2019-04-03 ENCOUNTER — Encounter: Payer: Self-pay | Admitting: Hematology

## 2019-04-03 ENCOUNTER — Other Ambulatory Visit: Payer: Self-pay | Admitting: Hematology

## 2019-04-03 ENCOUNTER — Inpatient Hospital Stay: Payer: PPO | Attending: Hematology | Admitting: Adult Health

## 2019-04-03 ENCOUNTER — Other Ambulatory Visit: Payer: Self-pay

## 2019-04-03 VITALS — BP 103/64 | HR 84 | Resp 16

## 2019-04-03 DIAGNOSIS — Z5189 Encounter for other specified aftercare: Secondary | ICD-10-CM | POA: Diagnosis not present

## 2019-04-03 DIAGNOSIS — M542 Cervicalgia: Secondary | ICD-10-CM | POA: Diagnosis not present

## 2019-04-03 DIAGNOSIS — Z79899 Other long term (current) drug therapy: Secondary | ICD-10-CM | POA: Diagnosis not present

## 2019-04-03 DIAGNOSIS — Z5112 Encounter for antineoplastic immunotherapy: Secondary | ICD-10-CM | POA: Insufficient documentation

## 2019-04-03 DIAGNOSIS — Z95828 Presence of other vascular implants and grafts: Secondary | ICD-10-CM

## 2019-04-03 DIAGNOSIS — R7989 Other specified abnormal findings of blood chemistry: Secondary | ICD-10-CM | POA: Diagnosis not present

## 2019-04-03 DIAGNOSIS — D7589 Other specified diseases of blood and blood-forming organs: Secondary | ICD-10-CM | POA: Diagnosis not present

## 2019-04-03 DIAGNOSIS — Z5111 Encounter for antineoplastic chemotherapy: Secondary | ICD-10-CM | POA: Insufficient documentation

## 2019-04-03 DIAGNOSIS — Z791 Long term (current) use of non-steroidal anti-inflammatories (NSAID): Secondary | ICD-10-CM | POA: Insufficient documentation

## 2019-04-03 DIAGNOSIS — C8331 Diffuse large B-cell lymphoma, lymph nodes of head, face, and neck: Secondary | ICD-10-CM

## 2019-04-03 DIAGNOSIS — D759 Disease of blood and blood-forming organs, unspecified: Secondary | ICD-10-CM | POA: Diagnosis not present

## 2019-04-03 LAB — CMP (CANCER CENTER ONLY)
ALT: 15 U/L (ref 0–44)
AST: 22 U/L (ref 15–41)
Albumin: 4 g/dL (ref 3.5–5.0)
Alkaline Phosphatase: 70 U/L (ref 38–126)
Anion gap: 8 (ref 5–15)
BUN: 13 mg/dL (ref 8–23)
CO2: 26 mmol/L (ref 22–32)
Calcium: 9.4 mg/dL (ref 8.9–10.3)
Chloride: 106 mmol/L (ref 98–111)
Creatinine: 0.7 mg/dL (ref 0.44–1.00)
GFR, Est AFR Am: >60 mL/min (ref >60)
GFR, Estimated: >60 mL/min (ref >60)
Glucose, Bld: 90 mg/dL (ref 70–99)
Potassium: 3.9 mmol/L (ref 3.5–5.1)
Sodium: 140 mmol/L (ref 135–145)
Total Bilirubin: 0.5 mg/dL (ref 0.3–1.2)
Total Protein: 7.7 g/dL (ref 6.5–8.1)

## 2019-04-03 LAB — CBC WITH DIFFERENTIAL (CANCER CENTER ONLY)
Abs Immature Granulocytes: 0.03 10*3/uL (ref 0.00–0.07)
Basophils Absolute: 0 10*3/uL (ref 0.0–0.1)
Basophils Relative: 0 %
Eosinophils Absolute: 0.1 10*3/uL (ref 0.0–0.5)
Eosinophils Relative: 2 %
HCT: 39.5 % (ref 36.0–46.0)
Hemoglobin: 12.5 g/dL (ref 12.0–15.0)
Immature Granulocytes: 1 %
Lymphocytes Relative: 25 %
Lymphs Abs: 1.2 10*3/uL (ref 0.7–4.0)
MCH: 25.9 pg — ABNORMAL LOW (ref 26.0–34.0)
MCHC: 31.6 g/dL (ref 30.0–36.0)
MCV: 82 fL (ref 80.0–100.0)
Monocytes Absolute: 0.7 10*3/uL (ref 0.1–1.0)
Monocytes Relative: 14 %
Neutro Abs: 2.9 10*3/uL (ref 1.7–7.7)
Neutrophils Relative %: 58 %
Platelet Count: 351 10*3/uL (ref 150–400)
RBC: 4.82 MIL/uL (ref 3.87–5.11)
RDW: 14.9 % (ref 11.5–15.5)
WBC Count: 5 10*3/uL (ref 4.0–10.5)
nRBC: 0 % (ref 0.0–0.2)

## 2019-04-03 MED ORDER — HEPARIN SOD (PORK) LOCK FLUSH 100 UNIT/ML IV SOLN
500.0000 [IU] | Freq: Once | INTRAVENOUS | Status: AC
  Administered 2019-04-03: 500 [IU] via INTRAVENOUS
  Filled 2019-04-03: qty 5

## 2019-04-03 MED ORDER — SODIUM CHLORIDE 0.9% FLUSH
10.0000 mL | INTRAVENOUS | Status: DC | PRN
  Administered 2019-04-03: 10 mL via INTRAVENOUS
  Filled 2019-04-03: qty 10

## 2019-04-03 MED ORDER — LIDOCAINE HCL 2 % IJ SOLN
INTRAMUSCULAR | Status: AC
  Filled 2019-04-03: qty 40

## 2019-04-03 NOTE — Patient Instructions (Signed)
  [facility] Discharge Instructions for Patients having bone marrow biopsy and aspiration  Feel free to call the clinic should you have any questions or concerns. The clinic phone number is [phone].     Bone Marrow Aspiration and Bone Marrow Biopsy, Adult, Care After This sheet gives you information about how to care for yourself after your procedure. Your health care provider may also give you more specific instructions. If you have problems or questions, contact your health care provider. What can I expect after the procedure? After the procedure, it is common to have:  Mild pain and tenderness.  Swelling.  Bruising. Follow these instructions at home: Puncture site care      Follow instructions from your health care provider about how to take care of the puncture site. Make sure you: ? Wash your hands with soap and water before you change your bandage (dressing). If soap and water are not available, use hand sanitizer. ? Change your dressing as told by your health care provider.  Check your puncture siteevery day for signs of infection. Check for: ? More redness, swelling, or pain. ? More fluid or blood. ? Warmth. ? Pus or a bad smell. General instructions  Take over-the-counter and prescription medicines only as told by your health care provider.  Do not take baths, swim, or use a hot tub until your health care provider approves. Ask if you can take a shower or have a sponge bath.  Return to your normal activities as told by your health care provider. Ask your health care provider what activities are safe for you.  Do not drive for 24 hours if you were given a medicine to help you relax (sedative) during your procedure.  Keep all follow-up visits as told by your health care provider. This is important. Contact a health care provider if:  Your pain is not controlled with medicine. Get help right away if:  You have a fever.  You have more  redness, swelling, or pain around the puncture site.  You have more fluid or blood coming from the puncture site.  Your puncture site feels warm to the touch.  You have pus or a bad smell coming from the puncture site. These symptoms may represent a serious problem that is an emergency. Do not wait to see if the symptoms will go away. Get medical help right away. Call your local emergency services (911 in the [place] Do not drive yourself to the hospital. Summary  After the procedure, it is common to have mild pain, tenderness, swelling, and bruising.  Follow instructions from your health care provider about how to take care of the puncture site.  Get help right away if you have any symptoms of infection or if you have more blood or fluid coming from the puncture site. This information is not intended to replace advice given to you by your health care provider. Make sure you discuss any questions you have with your health care provider. Document Released: 02/26/2005 Document Revised: 11/22/2017 Document Reviewed: 01/21/2016 Elsevier Patient Education  2020 [employer].

## 2019-04-03 NOTE — Progress Notes (Signed)
Called patient to introduce myself as Arboriculturist and to offer available resources.  Patient has 2 insurances therefore copay assistance is not needed.  Discussed the one-time $39 [place] and qualifications to assist with personal expenses while going through treatment. Patient interested in applying and will bring proof of income on 04/04/19.  Discussed one-time stipend through [facility] for patient to use for personal expenses. Patient gave me permission to apply on her behalf.  Will meet with patient on 04/04/19.  Attempted to apply for stipend on [employer] website. Funds are fully subscribed at this time. Will advise patient on 04/04/19.

## 2019-04-03 NOTE — Progress Notes (Signed)
0853 dressing on bone marrow site clean dry, and intact. Instructions given to atient. Pt verbalized understanding

## 2019-04-03 NOTE — Progress Notes (Signed)
[facility] OFFICE PROGRESS NOTE  Patient Care Team: Jonathon Jordan, MD as PCP - General (Family Medicine) Tish Men, MD as Consulting Physician (Hematology) Eppie Gibson, MD as Attending Physician (Radiation Oncology) Leota Sauers, RN as Oncology Nurse Navigator  HEME/ONC OVERVIEW: 1. 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 BCL2 rearrangement positive, no BCL6 or Myc rearrangement -03/2019: PET showed bulky left supraclavicular as well as left axillary, RP, mesenteric and iliac adenopathy; bone marrow results pending  -Mid-03/2019 - present: R-CHOP with G-CSF support   2. Port placed in 03/2019  TREATMENT REGIMEN:  04/04/2019 - present: R-CHOP with Udenyca   ASSESSMENT & PLAN:   DLBCL, at least Stage III; BCL2 rearrangement+ -I independently reviewed the radiologic images of her recent PET, and the current findings documented -In summary, PET showed bulky left supraclavicular adenopathy, as well as left axillary, retroperitoneal, mesenteric and iliac adenopathy.  There was no definite bony involvement.  -Patient underwent bone marrow bx on 04/03/2019, results pending -I reviewed the PET and LN bx results in detail with the patient -In the absence of double-it lymphoma, the treatment regimen remains unchanged and I recommend proceeding with R-CHOP -Labs adequate today, proceed with Cycle 1 of R-CHOP -Plan for 6 cycles -We will repeat PET after 3 cycles of chemotherapy to assess interim response -I reinforced the rationale for and some of the potential side effects of chemotherapy, including nausea, vomiting, diarrhea, cytopenias, and increased risk of infection  Thrombocytosis -Likely reactive in the setting of lymphoma -Plts 351k today, improving -We will monitor it for  now  Left-neck pain -Secondary to the bulky lymph node disease -Pain relatively well controlled -PRN tramadol  -I counseled the pain on avoiding excess Tylenol/NSAID intake due to the risk of masking fever  No orders of the defined types were placed in this encounter.  All questions were answered. The patient knows to call the clinic with any problems, questions or concerns. No barriers to learning was detected.  Return in 3 weeks for labs, port flush, clinic appt and Cycle 2 of chemotherapy.   Tish Men, MD 04/04/2019 9:09 AM  CHIEF COMPLAINT: "I am ready to start treatment"  INTERVAL HISTORY: Ms. 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  Patient reports that she feels that her left side of her neck has slightly enlarged since she was last seen, but overall, the mass has not drastically increased in size.  She has mild soreness in the left side of the neck.  She had a bone marrow biopsy yesterday, and tolerated well.  She had a chemotherapy education class yesterday, and I reinforced some of the benefits and potential toxicities of chemotherapy today.  Patient denies any other complaint today.  SUMMARY OF ONCOLOGIC HISTORY: Oncology History  DLBCL (diffuse large B cell lymphoma) ([facility])  02/19/2019 Imaging   CT neck: IMPRESSION: 1. 7 cm left lower neck mass extending into the superior mediastinum most concerning for malignancy. This may reflect a conglomerate nodal mass or primary tumor of uncertain origin. 2. Additional enlarged lymph nodes adjacent to the mass in the left supraclavicular/retroclavicular and left subpectoral regions. 3.  Aortic Atherosclerosis (ICD10-I70.0).   03/12/2019 Procedure   Incisional left cervical LN bx by Dr. Constance Holster   03/12/2019 Pathology Results   Accession: [id number] Soft tissue mass,  simple excision, Left Neck - DIFFUSE LARGE B-CELL LYMPHOMA - SEE COMMENT   03/19/2019 Initial Diagnosis   DLBCL (diffuse large  B cell lymphoma) ([place])   03/29/2019 Imaging   PET: IMPRESSION: 1. The large left supraclavicular mass is intensely hypermetabolic compatible with history of lymphoma. There are also nearby FDG avid left supraclavicular, left axillary, and left retroperitoneal lymph nodes. 2. Hypermetabolic retroperitoneal, mesenteric and iliac adenopathy. 3. Aortic Atherosclerosis (ICD10-I70.0). Coronary artery calcifications.   04/04/2019 -  Chemotherapy   The patient had DOXOrubicin (ADRIAMYCIN) chemo injection 80 mg, 50 mg/m2 = 80 mg, Intravenous,  Once, 0 of 6 cycles palonosetron (ALOXI) injection 0.25 mg, 0.25 mg, Intravenous,  Once, 0 of 6 cycles pegfilgrastim-jmdb (FULPHILA) injection 6 mg, 6 mg, Subcutaneous,  Once, 0 of 6 cycles vinCRIStine (ONCOVIN) 2 mg in sodium chloride 0.9 % 50 mL chemo infusion, 2 mg, Intravenous,  Once, 0 of 6 cycles riTUXimab (RITUXAN) 600 mg in sodium chloride 0.9 % 250 mL (1.9355 mg/mL) infusion, 375 mg/m2 = 600 mg, Intravenous,  Once, 0 of 6 cycles cyclophosphamide (CYTOXAN) 1,180 mg in sodium chloride 0.9 % 250 mL chemo infusion, 750 mg/m2 = 1,180 mg, Intravenous,  Once, 0 of 6 cycles fosaprepitant (EMEND) 150 mg, dexamethasone (DECADRON) 12 mg in sodium chloride 0.9 % 145 mL IVPB, , Intravenous,  Once, 0 of 6 cycles  for chemotherapy treatment.      REVIEW OF SYSTEMS:   Constitutional: ( - ) fevers, ( - )  chills , ( - ) night sweats Eyes: ( - ) blurriness of vision, ( - ) double vision, ( - ) watery eyes Ears, nose, mouth, throat, and face: ( - ) mucositis, ( - ) sore throat Respiratory: ( - ) cough, ( - ) dyspnea, ( - ) wheezes Cardiovascular: ( - ) palpitation, ( - ) chest discomfort, ( - ) lower extremity swelling Gastrointestinal:  ( - ) nausea, ( - ) heartburn, ( - ) change in bowel habits Skin: ( - ) abnormal skin rashes Lymphatics: ( + ) lymphadenopathy, ( - ) easy bruising Neurological: ( - ) numbness, ( - ) tingling, ( - ) new  weaknesses Behavioral/Psych: ( - ) mood change, ( - ) new changes  All other systems were reviewed with the patient and are negative.  I have reviewed the past medical history, past surgical history, social history and family history with the patient and they are unchanged from previous note.  ALLERGIES:  is allergic to codeine and sulfa antibiotics.  MEDICATIONS:  Current Outpatient Medications  Medication Sig Dispense Refill  . acetaminophen (TYLENOL) 325 MG tablet Take 650 mg by mouth every 6 (six) hours as needed.    [name] Kitchen allopurinol (ZYLOPRIM) 300 MG tablet Take 1 tablet (300 mg total) by mouth daily. 30 tablet 3  . Ascorbic Acid (VITAMIN C) 100 MG tablet Take 100 mg by mouth daily.    . Celecoxib (CELEBREX PO) Take by mouth.    [name] Kitchen HYDROcodone-acetaminophen (NORCO) 7.5-325 MG tablet Take 1 tablet by mouth every 6 (six) hours as needed for moderate pain. 20 tablet 0  . Multiple Vitamin (MULTIVITAMIN) tablet Take 1 tablet by mouth daily.    . traMADol (ULTRAM) 50 MG tablet Take by mouth every 6 (six) hours as needed.    . vitamin B-12 (CYANOCOBALAMIN) 100 MCG tablet Take 100 mcg by mouth daily.    [name] Kitchen zolpidem (AMBIEN) 5 MG tablet Take 5 mg by mouth at bedtime as needed for sleep.    [name] Kitchen  lidocaine-prilocaine (EMLA) cream Apply to affected area once (Patient not taking: Reported on 04/04/2019) 30 g 3  . ondansetron (ZOFRAN) 8 MG tablet Take 1 tablet (8 mg total) by mouth 2 (two) times daily as needed for refractory nausea / vomiting. Start on day 3 after cyclophosphamide chemotherapy. (Patient not taking: Reported on 04/04/2019) 30 tablet 1  . prochlorperazine (COMPAZINE) 10 MG tablet Take 1 tablet (10 mg total) by mouth every 6 (six) hours as needed (Nausea or vomiting). (Patient not taking: Reported on 04/04/2019) 30 tablet 6   No current facility-administered medications for this visit.     PHYSICAL EXAMINATION: ECOG PERFORMANCE STATUS: 1 - Symptomatic but completely ambulatory  Today's  Vitals   04/04/19 0834 04/04/19 0838  BP: 124/70   Pulse: 95   Resp: 18   Temp: 97.8 F (36.6 C)   TempSrc: Oral   SpO2: 100%   Weight: 131 lb 8 oz (59.6 kg)   Height: '4\' 10"'  (1.473 m)   PainSc:  0-No pain   Body mass index is 27.48 kg/m.  Filed Weights   04/04/19 0834  Weight: 131 lb 8 oz (59.6 kg)    GENERAL: alert, no distress and comfortable SKIN: skin color, texture, turgor are normal, no rashes or significant lesions EYES: conjunctiva are pink and non-injected, sclera clear OROPHARYNX: no exudate, no erythema; lips, buccal mucosa, and tongue normal  NECK: supple, mildly uncomfortable with palpation  LYMPH:  Bulky left cervical adenopathy measuring > 7cm  LUNGS: clear to auscultation with normal breathing effort HEART: regular rate & rhythm and no murmurs and no lower extremity edema ABDOMEN: soft, non-tender, non-distended, normal bowel sounds Musculoskeletal: no cyanosis of digits and no clubbing  PSYCH: alert & oriented x 3, fluent speech NEURO: no focal motor/sensory deficits  LABORATORY DATA:  I have reviewed the data as listed    Component Value Date/Time   NA 140 04/03/2019 0912   NA 137 01/16/2015   K 3.9 04/03/2019 0912   CL 106 04/03/2019 0912   CO2 26 04/03/2019 0912   GLUCOSE 90 04/03/2019 0912   BUN 13 04/03/2019 0912   BUN 12 01/16/2015   CREATININE 0.70 04/03/2019 0912   CALCIUM 9.4 04/03/2019 0912   PROT 7.7 04/03/2019 0912   ALBUMIN 4.0 04/03/2019 0912   AST 22 04/03/2019 0912   ALT 15 04/03/2019 0912   ALKPHOS 70 04/03/2019 0912   BILITOT 0.5 04/03/2019 0912   GFRNONAA >60 04/03/2019 0912   GFRAA >60 04/03/2019 0912    No results found for: SPEP, UPEP  Lab Results  Component Value Date   WBC 5.0 04/03/2019   NEUTROABS 2.9 04/03/2019   HGB 12.5 04/03/2019   HCT 39.5 04/03/2019   MCV 82.0 04/03/2019   PLT 351 04/03/2019      Chemistry      Component Value Date/Time   NA 140 04/03/2019 0912   NA 137 01/16/2015   K 3.9  04/03/2019 0912   CL 106 04/03/2019 0912   CO2 26 04/03/2019 0912   BUN 13 04/03/2019 0912   BUN 12 01/16/2015   CREATININE 0.70 04/03/2019 0912   GLU 96 01/16/2015      Component Value Date/Time   CALCIUM 9.4 04/03/2019 0912   ALKPHOS 70 04/03/2019 0912   AST 22 04/03/2019 0912   ALT 15 04/03/2019 0912   BILITOT 0.5 04/03/2019 0912       RADIOGRAPHIC STUDIES: I have personally reviewed the radiological images as listed below and agreed with the  findings in the report. Nm Pet Image Initial (pi) Skull Base To Thigh  Result Date: 03/29/2019 CLINICAL DATA:  Initial treatment strategy for lymphoma. EXAM: NUCLEAR MEDICINE PET SKULL BASE TO THIGH TECHNIQUE: 6.39 mCi F-18 FDG was injected intravenously. Full-ring PET imaging was performed from the skull base to thigh after the radiotracer. CT data was obtained and used for attenuation correction and anatomic localization. Fasting blood glucose: 89 mg/dl COMPARISON:  None FINDINGS: Mediastinal blood pool activity: SUV max 2.98 Liver activity: SUV max 4.36 NECK: Large hypermetabolic mass within the left supraclavicular region is identified measuring 7.4 cm within SUV max 29.2. This extends into the superior mediastinum within the left prevascular region and has mass effect upon the great vessels and trachea. Hypermetabolic lateral supraclavicular region lymph node measures 1.6 cm with SUV max of 23.79. Incidental CT findings: none CHEST: Left retropectoral FDG avid lymph nodes are identified. The largest measures 1.5 cm with SUV max of 30.42. Left lateral chest wall versus inferior left axillary lymph node measures 1.7 x 0.6 cm within SUV max of 2.8. No hypermetabolic mediastinal or hilar lymph nodes identified. No suspicious FDG avid pulmonary nodules identified. Small subpleural nodule within the posterior left lower lobe measures 4 mm. Nearby central left lower lobe lung nodule measures 3 mm. These are both too small to characterize by PET-CT.  Incidental CT findings: Aortic atherosclerosis. Coronary artery calcifications. ABDOMEN/PELVIS: No FDG uptake identified within the liver, pancreas, or spleen. Normal adrenal glands. Hypermetabolic abdominal and pelvic lymph nodes:. Left retroperitoneal lymph node measures 1.1 cm and has SUV max of 6.97. Right retrocaval lymph node has a SUV max of 7.3. FDG avid aortocaval nodes are noted with SUV max of 5.8. Within the low right mesentery there is a 1.8 cm lymph node within SUV max of 22.3. At the aortic bifurcation there is a left common iliac node measuring 1.1 cm with SUV max of 7.1. Right internal iliac lymph node demonstrates mild uptake within SUV max of 4.48. Incidental CT findings: Aortic atherosclerosis without aneurysm. Colonic diverticulosis. No ascites. SKELETON: No focal hypermetabolic activity to suggest skeletal metastasis. Incidental CT findings: none IMPRESSION: 1. The large left supraclavicular mass is intensely hypermetabolic compatible with history of lymphoma. There are also nearby FDG avid left supraclavicular, left axillary, and left retroperitoneal lymph nodes. 2. Hypermetabolic retroperitoneal, mesenteric and iliac adenopathy. 3. Aortic Atherosclerosis (ICD10-I70.0). Coronary artery calcifications. Electronically Signed   By: Kerby Moors M.D.   On: 03/29/2019 13:36   [facility] Imaging Guided Port Insertion  Result Date: 03/28/2019 CLINICAL DATA:  Newly diagnosed large B-cell lymphoma EXAM: TUNNELED PORT CATHETER PLACEMENT WITH ULTRASOUND AND FLUOROSCOPIC GUIDANCE FLUOROSCOPY TIME:  0.1 minute; 11 uGym2 DAP ANESTHESIA/SEDATION: Intravenous Fentanyl 154mg and Versed 286mwere administered as conscious sedation during continuous monitoring of the patient's level of consciousness and physiological / cardiorespiratory status by the radiology RN, with a total moderate sedation time of 14 minutes. TECHNIQUE: The procedure, risks, benefits, and alternatives were explained to the patient. Questions  regarding the procedure were encouraged and answered. The patient understands and consents to the procedure. As antibiotic prophylaxis, cefazolin 2 g was ordered pre-procedure and administered intravenously within one hour of incision. Patency of the right IJ vein was confirmed with ultrasound with image documentation. An appropriate skin site was determined. Skin site was marked. Region was prepped using maximum barrier technique including cap and mask, sterile gown, sterile gloves, large sterile sheet, and Chlorhexidine as cutaneous antisepsis. The region was infiltrated locally with 1% lidocaine.  Under real-time ultrasound guidance, the right IJ vein was accessed with a 21 gauge micropuncture needle; the needle tip within the vein was confirmed with ultrasound image documentation. Needle was exchanged over a 018 guidewire for transitional dilator, and vascular measurement was performed. A small incision was made on the right anterior chest wall and a subcutaneous pocket fashioned. The power-injectable port was positioned and its catheter tunneled to the right IJ dermatotomy site. The transitional dilator was exchanged over an Amplatz wire for a peel-away sheath, through which the port catheter, which had been trimmed to the appropriate length, was advanced and positioned under fluoroscopy with its tip at the cavoatrial junction. Spot chest radiograph confirms good catheter position and no pneumothorax. The port was flushed per protocol. The pocket was closed with deep interrupted and subcuticular continuous 3-0 Monocryl sutures. The incisions were covered with Dermabond then covered with a sterile dressing. The patient tolerated the procedure well. COMPLICATIONS: COMPLICATIONS None immediate IMPRESSION: Technically successful right IJ power-injectable port catheter placement. Ready for routine use. Electronically Signed   By: Lucrezia Europe M.D.   On: 03/28/2019 13:51

## 2019-04-03 NOTE — Progress Notes (Signed)
INDICATION:lymphoma    Bone Marrow Biopsy and Aspiration Procedure Note   Informed consent was obtained and potential risks including bleeding, infection and pain were reviewed with the patient.  The patient's name, date of birth, identification, consent and allergies were verified prior to the start of procedure and time out was performed.  The left posterior iliac crest was chosen as the site of biopsy.  The skin was prepped with ChloraPrep.   16 cc of 2% lidocaine was used to provide local anaesthesia.   10 cc of bone marrow aspirate was obtained followed by 1.1cm biopsy.  Pressure was applied to the biopsy site and bandage was placed over the biopsy site. Patient was made to lie on the back for 30 mins prior to discharge.  The procedure was tolerated well. COMPLICATIONS: None BLOOD LOSS: none The patient was discharged home in stable condition with a 1 week follow up to review results.  Patient was provided with post bone marrow biopsy instructions and instructed to call if there was any bleeding or worsening pain.  Specimens sent for flow cytometry, cytogenetics and additional studies.  Signed Scot Dock, NP

## 2019-04-04 ENCOUNTER — Other Ambulatory Visit: Payer: PPO

## 2019-04-04 ENCOUNTER — Other Ambulatory Visit: Payer: Self-pay

## 2019-04-04 ENCOUNTER — Encounter: Payer: Self-pay | Admitting: Hematology

## 2019-04-04 ENCOUNTER — Encounter: Payer: Self-pay | Admitting: *Deleted

## 2019-04-04 ENCOUNTER — Inpatient Hospital Stay: Payer: PPO

## 2019-04-04 ENCOUNTER — Inpatient Hospital Stay (HOSPITAL_BASED_OUTPATIENT_CLINIC_OR_DEPARTMENT_OTHER): Payer: PPO | Admitting: Hematology

## 2019-04-04 VITALS — BP 124/70 | HR 95 | Temp 97.8°F | Resp 18 | Ht <= 58 in | Wt 131.5 lb

## 2019-04-04 VITALS — BP 109/62 | HR 86 | Temp 98.2°F | Resp 17

## 2019-04-04 DIAGNOSIS — C8331 Diffuse large B-cell lymphoma, lymph nodes of head, face, and neck: Secondary | ICD-10-CM | POA: Diagnosis not present

## 2019-04-04 DIAGNOSIS — D473 Essential (hemorrhagic) thrombocythemia: Secondary | ICD-10-CM | POA: Diagnosis not present

## 2019-04-04 DIAGNOSIS — Z5189 Encounter for other specified aftercare: Secondary | ICD-10-CM | POA: Diagnosis not present

## 2019-04-04 DIAGNOSIS — D75839 Thrombocytosis, unspecified: Secondary | ICD-10-CM

## 2019-04-04 DIAGNOSIS — G893 Neoplasm related pain (acute) (chronic): Secondary | ICD-10-CM

## 2019-04-04 MED ORDER — SODIUM CHLORIDE 0.9 % IV SOLN
750.0000 mg/m2 | Freq: Once | INTRAVENOUS | Status: AC
  Administered 2019-04-04: 11:00:00 1180 mg via INTRAVENOUS
  Filled 2019-04-04: qty 59

## 2019-04-04 MED ORDER — SODIUM CHLORIDE 0.9 % IV SOLN
375.0000 mg/m2 | Freq: Once | INTRAVENOUS | Status: AC
  Administered 2019-04-04: 13:00:00 600 mg via INTRAVENOUS
  Filled 2019-04-04: qty 10

## 2019-04-04 MED ORDER — PALONOSETRON HCL INJECTION 0.25 MG/5ML
0.2500 mg | Freq: Once | INTRAVENOUS | Status: AC
  Administered 2019-04-04: 0.25 mg via INTRAVENOUS

## 2019-04-04 MED ORDER — SODIUM CHLORIDE 0.9% FLUSH
10.0000 mL | INTRAVENOUS | Status: DC | PRN
  Administered 2019-04-04: 10 mL
  Filled 2019-04-04: qty 10

## 2019-04-04 MED ORDER — SODIUM CHLORIDE 0.9 % IV SOLN
Freq: Once | INTRAVENOUS | Status: AC
  Administered 2019-04-04: 10:00:00 via INTRAVENOUS
  Filled 2019-04-04: qty 250

## 2019-04-04 MED ORDER — PALONOSETRON HCL INJECTION 0.25 MG/5ML
INTRAVENOUS | Status: AC
  Filled 2019-04-04: qty 5

## 2019-04-04 MED ORDER — SODIUM CHLORIDE 0.9 % IV SOLN
Freq: Once | INTRAVENOUS | Status: AC
  Administered 2019-04-04: 10:00:00 via INTRAVENOUS
  Filled 2019-04-04: qty 5

## 2019-04-04 MED ORDER — DIPHENHYDRAMINE HCL 25 MG PO CAPS
50.0000 mg | ORAL_CAPSULE | Freq: Once | ORAL | Status: AC
  Administered 2019-04-04: 50 mg via ORAL

## 2019-04-04 MED ORDER — DOXORUBICIN HCL CHEMO IV INJECTION 2 MG/ML
50.0000 mg/m2 | Freq: Once | INTRAVENOUS | Status: AC
  Administered 2019-04-04: 80 mg via INTRAVENOUS
  Filled 2019-04-04: qty 40

## 2019-04-04 MED ORDER — HEPARIN SOD (PORK) LOCK FLUSH 100 UNIT/ML IV SOLN
500.0000 [IU] | Freq: Once | INTRAVENOUS | Status: AC | PRN
  Administered 2019-04-04: 500 [IU]
  Filled 2019-04-04: qty 5

## 2019-04-04 MED ORDER — VINCRISTINE SULFATE CHEMO INJECTION 1 MG/ML
2.0000 mg | Freq: Once | INTRAVENOUS | Status: AC
  Administered 2019-04-04: 11:00:00 2 mg via INTRAVENOUS
  Filled 2019-04-04: qty 2

## 2019-04-04 MED ORDER — DIPHENHYDRAMINE HCL 25 MG PO CAPS
ORAL_CAPSULE | ORAL | Status: AC
  Filled 2019-04-04: qty 2

## 2019-04-04 MED ORDER — ACETAMINOPHEN 325 MG PO TABS
650.0000 mg | ORAL_TABLET | Freq: Once | ORAL | Status: AC
  Administered 2019-04-04: 650 mg via ORAL

## 2019-04-04 MED ORDER — ACETAMINOPHEN 325 MG PO TABS
ORAL_TABLET | ORAL | Status: AC
  Filled 2019-04-04: qty 2

## 2019-04-04 NOTE — Progress Notes (Signed)
Went to treatment to introduce myself in person as Arboriculturist.  Patient forgot to bring income documentation for grant. Advised her she may bring at her next visit and leave at front desk to be placed in my mailbox. She verbalized understanding.Provided her with an expense sheet of what grant can cover if approved.  She has my card for any additional financial questions or concerns.

## 2019-04-04 NOTE — Patient Instructions (Signed)
[facility] Discharge Instructions for Patients Receiving Chemotherapy  Today you received the following chemotherapy agents: Doxorubicin (Adriamycin), Vincristine (Oncovin), Cyclophosphamide (Cytoxan), and Rituximab (Rituxan)  To help prevent nausea and vomiting after your treatment, we encourage you to take your nausea medication as directed. Received Aloxi during treatment today-->Take your Compazine prescription (not your Zofran) for the next 3 days as needed.    If you develop nausea and vomiting that is not controlled by your nausea medication, call the clinic.   BELOW ARE SYMPTOMS THAT SHOULD BE REPORTED IMMEDIATELY:  *FEVER GREATER THAN 100.5 F  *CHILLS WITH OR WITHOUT FEVER  NAUSEA AND VOMITING THAT IS NOT CONTROLLED WITH YOUR NAUSEA MEDICATION  *UNUSUAL SHORTNESS OF BREATH  *UNUSUAL BRUISING OR BLEEDING  TENDERNESS IN MOUTH AND THROAT WITH OR WITHOUT PRESENCE OF ULCERS  *URINARY PROBLEMS  *BOWEL PROBLEMS  UNUSUAL RASH Items with * indicate a potential emergency and should be followed up as soon as possible.  Feel free to call the clinic should you have any questions or concerns. The clinic phone number is [phone].  Please show the [facility] at check-in to the Emergency Department and triage nurse.  Doxorubicin injection What is this medicine? DOXORUBICIN (dox oh ROO bi sin) is a chemotherapy drug. It is used to treat many kinds of cancer like leukemia, lymphoma, neuroblastoma, sarcoma, and Wilms' tumor. It is also used to treat bladder cancer, breast cancer, lung cancer, ovarian cancer, stomach cancer, and thyroid cancer. This medicine may be used for other purposes; ask your health care provider or pharmacist if you have questions. COMMON BRAND NAME(S): Adriamycin, Adriamycin PFS, Adriamycin RDF, Rubex What should I tell my health care provider before I take this medicine? They need to know if you have any of these conditions:  heart  disease  history of low blood counts caused by a medicine  liver disease  recent or ongoing radiation therapy  an unusual or allergic reaction to doxorubicin, other chemotherapy agents, other medicines, foods, dyes, or preservatives  pregnant or trying to get pregnant  breast-feeding How should I use this medicine? This drug is given as an infusion into a vein. It is administered in a hospital or clinic by a specially trained health care professional. If you have pain, swelling, burning or any unusual feeling around the site of your injection, tell your health care professional right away. Talk to your pediatrician regarding the use of this medicine in children. Special care may be needed. Overdosage: If you think you have taken too much of this medicine contact a poison control center or emergency room at once. NOTE: This medicine is only for you. Do not share this medicine with others. What if I miss a dose? It is important not to miss your dose. Call your doctor or health care professional if you are unable to keep an appointment. What may interact with this medicine? This medicine may interact with the following medications:  6-mercaptopurine  paclitaxel  phenytoin  St. John's Wort  trastuzumab  verapamil This list may not describe all possible interactions. Give your health care provider a list of all the medicines, herbs, non-prescription drugs, or dietary supplements you use. Also tell them if you smoke, drink alcohol, or use illegal drugs. Some items may interact with your medicine. What should I watch for while using this medicine? This drug may make you feel generally unwell. This is not uncommon, as chemotherapy can affect healthy cells as well as cancer cells. Report  any side effects. Continue your course of treatment even though you feel ill unless your doctor tells you to stop. There is a maximum amount of this medicine you should receive throughout your life. The  amount depends on the medical condition being treated and your overall health. Your doctor will watch how much of this medicine you receive in your lifetime. Tell your doctor if you have taken this medicine before. You may need blood work done while you are taking this medicine. Your urine may turn red for a few days after your dose. This is not blood. If your urine is dark or brown, call your doctor. In some cases, you may be given additional medicines to help with side effects. Follow all directions for their use. Call your doctor or health care professional for advice if you get a fever, chills or sore throat, or other symptoms of a cold or flu. Do not treat yourself. This drug decreases your body's ability to fight infections. Try to avoid being around people who are sick. This medicine may increase your risk to bruise or bleed. Call your doctor or health care professional if you notice any unusual bleeding. Talk to your doctor about your risk of cancer. You may be more at risk for certain types of cancers if you take this medicine. Do not become pregnant while taking this medicine or for 6 months after stopping it. Women should inform their doctor if they wish to become pregnant or think they might be pregnant. Men should not father a child while taking this medicine and for 6 months after stopping it. There is a potential for serious side effects to an unborn child. Talk to your health care professional or pharmacist for more information. Do not breast-feed an infant while taking this medicine. This medicine has caused ovarian failure in some women and reduced sperm counts in some men This medicine may interfere with the ability to have a child. Talk with your doctor or health care professional if you are concerned about your fertility. This medicine may cause a decrease in Co-Enzyme Q-10. You should make sure that you get enough Co-Enzyme Q-10 while you are taking this medicine. Discuss the foods you  eat and the vitamins you take with your health care professional. What side effects may I notice from receiving this medicine? Side effects that you should report to your doctor or health care professional as soon as possible:  allergic reactions like skin rash, itching or hives, swelling of the face, lips, or tongue  breathing problems  chest pain  fast or irregular heartbeat  low blood counts - this medicine may decrease the number of white blood cells, red blood cells and platelets. You may be at increased risk for infections and bleeding.  pain, redness, or irritation at site where injected  signs of infection - fever or chills, cough, sore throat, pain or difficulty passing urine  signs of decreased platelets or bleeding - bruising, pinpoint red spots on the skin, black, tarry stools, blood in the urine  swelling of the ankles, feet, hands  tiredness  weakness Side effects that usually do not require medical attention (report to your doctor or health care professional if they continue or are bothersome):  diarrhea  hair loss  mouth sores  nail discoloration or damage  nausea  red colored urine  vomiting This list may not describe all possible side effects. Call your doctor for medical advice about side effects. You may report side effects to  FDA at 1-800-FDA-1088. Where should I keep my medicine? This drug is given in a hospital or clinic and will not be stored at home. NOTE: This sheet is a summary. It may not cover all possible information. If you have questions about this medicine, talk to your doctor, pharmacist, or health care provider.  2020 Elsevier/Gold Standard (2017-03-23 11:01:26)  Vincristine injection What is this medicine? VINCRISTINE (vin KRIS teen) is a chemotherapy drug. It slows the growth of cancer cells. This medicine is used to treat many types of cancer like Hodgkin's disease, leukemia, non-Hodgkin's lymphoma, neuroblastoma (brain cancer),  rhabdomyosarcoma, and Wilms' tumor. This medicine may be used for other purposes; ask your health care provider or pharmacist if you have questions. COMMON BRAND NAME(S): Oncovin, Vincasar PFS What should I tell my health care provider before I take this medicine? They need to know if you have any of these conditions:  blood disorders  gout  infection (especially chickenpox, cold sores, or herpes)  kidney disease  liver disease  lung disease  nervous system disease like Charcot-Marie-Tooth (CMT)  recent or ongoing radiation therapy  an unusual or allergic reaction to vincristine, other chemotherapy agents, other medicines, foods, dyes, or preservatives  pregnant or trying to get pregnant  breast-feeding How should I use this medicine? This drug is given as an infusion into a vein. It is administered in a hospital or clinic by a specially trained health care professional. If you have pain, swelling, burning, or any unusual feeling around the site of your injection, tell your health care professional right away. Talk to your pediatrician regarding the use of this medicine in children. While this drug may be prescribed for selected conditions, precautions do apply. Overdosage: If you think you have taken too much of this medicine contact a poison control center or emergency room at once. NOTE: This medicine is only for you. Do not share this medicine with others. What if I miss a dose? It is important not to miss your dose. Call your doctor or health care professional if you are unable to keep an appointment. What may interact with this medicine? Do not take this medicine with any of the following medications:  itraconazole  mibefradil  voriconazole This medicine may also interact with the following medications:  cyclosporine  erythromycin  fluconazole  ketoconazole  medicines for HIV like delavirdine, efavirenz, nevirapine  medicines for seizures like ethotoin,  fosphenotoin, phenytoin  medicines to increase blood counts like filgrastim, pegfilgrastim, sargramostim  other chemotherapy drugs like cisplatin, L-asparaginase, methotrexate, mitomycin, paclitaxel  pegaspargase  vaccines  zalcitabine, ddC Talk to your doctor or health care professional before taking any of these medicines:  acetaminophen  aspirin  ibuprofen  ketoprofen  naproxen This list may not describe all possible interactions. Give your health care provider a list of all the medicines, herbs, non-prescription drugs, or dietary supplements you use. Also tell them if you smoke, drink alcohol, or use illegal drugs. Some items may interact with your medicine. What should I watch for while using this medicine? Your condition will be monitored carefully while you are receiving this medicine. You will need important blood work done while you are taking this medicine. This drug may make you feel generally unwell. This is not uncommon, as chemotherapy can affect healthy cells as well as cancer cells. Report any side effects. Continue your course of treatment even though you feel ill unless your doctor tells you to stop. In some cases, you may be given additional medicines  to help with side effects. Follow all directions for their use. Call your doctor or health care professional for advice if you get a fever, chills or sore throat, or other symptoms of a cold or flu. Do not treat yourself. Avoid taking products that contain aspirin, acetaminophen, ibuprofen, naproxen, or ketoprofen unless instructed by your doctor. These medicines may hide a fever. Do not become pregnant while taking this medicine. Women should inform their doctor if they wish to become pregnant or think they might be pregnant. There is a potential for serious side effects to an unborn child. Talk to your health care professional or pharmacist for more information. Do not breast-feed an infant while taking this  medicine. Men may have a lower sperm count while taking this medicine. Talk to your doctor if you plan to father a child. What side effects may I notice from receiving this medicine? Side effects that you should report to your doctor or health care professional as soon as possible:  allergic reactions like skin rash, itching or hives, swelling of the face, lips, or tongue  breathing problems  confusion or changes in emotions or moods  constipation  cough  mouth sores  muscle weakness  nausea and vomiting  pain, swelling, redness or irritation at the injection site  pain, tingling, numbness in the hands or feet  problems with balance, talking, walking  seizures  stomach pain  trouble passing urine or change in the amount of urine Side effects that usually do not require medical attention (report to your doctor or health care professional if they continue or are bothersome):  diarrhea  hair loss  jaw pain  loss of appetite This list may not describe all possible side effects. Call your doctor for medical advice about side effects. You may report side effects to FDA at 1-800-FDA-1088. Where should I keep my medicine? This drug is given in a hospital or clinic and will not be stored at home. NOTE: This sheet is a summary. It may not cover all possible information. If you have questions about this medicine, talk to your doctor, pharmacist, or health care provider.  2020 Elsevier/Gold Standard (2008-05-06 17:17:13)  Cyclophosphamide injection What is this medicine? CYCLOPHOSPHAMIDE (sye kloe FOSS fa mide) is a chemotherapy drug. It slows the growth of cancer cells. This medicine is used to treat many types of cancer like lymphoma, myeloma, leukemia, breast cancer, and ovarian cancer, to name a few. This medicine may be used for other purposes; ask your health care provider or pharmacist if you have questions. COMMON BRAND NAME(S): Cytoxan, Neosar What should I tell my  health care provider before I take this medicine? They need to know if you have any of these conditions:  blood disorders  history of other chemotherapy  infection  kidney disease  liver disease  recent or ongoing radiation therapy  tumors in the bone marrow  an unusual or allergic reaction to cyclophosphamide, other chemotherapy, other medicines, foods, dyes, or preservatives  pregnant or trying to get pregnant  breast-feeding How should I use this medicine? This drug is usually given as an injection into a vein or muscle or by infusion into a vein. It is administered in a hospital or clinic by a specially trained health care professional. Talk to your pediatrician regarding the use of this medicine in children. Special care may be needed. Overdosage: If you think you have taken too much of this medicine contact a poison control center or emergency room at once. NOTE:  This medicine is only for you. Do not share this medicine with others. What if I miss a dose? It is important not to miss your dose. Call your doctor or health care professional if you are unable to keep an appointment. What may interact with this medicine? This medicine may interact with the following medications:  amiodarone  amphotericin B  azathioprine  certain antiviral medicines for HIV or AIDS such as protease inhibitors (e.g., indinavir, ritonavir) and zidovudine  certain blood pressure medications such as benazepril, captopril, enalapril, fosinopril, lisinopril, moexipril, monopril, perindopril, quinapril, ramipril, trandolapril  certain cancer medications such as anthracyclines (e.g., daunorubicin, doxorubicin), busulfan, cytarabine, paclitaxel, pentostatin, tamoxifen, trastuzumab  certain diuretics such as chlorothiazide, chlorthalidone, hydrochlorothiazide, indapamide, metolazone  certain medicines that treat or prevent blood clots like warfarin  certain muscle relaxants such as  succinylcholine  cyclosporine  etanercept  indomethacin  medicines to increase blood counts like filgrastim, pegfilgrastim, sargramostim  medicines used as general anesthesia  metronidazole  natalizumab This list may not describe all possible interactions. Give your health care provider a list of all the medicines, herbs, non-prescription drugs, or dietary supplements you use. Also tell them if you smoke, drink alcohol, or use illegal drugs. Some items may interact with your medicine. What should I watch for while using this medicine? Visit your doctor for checks on your progress. This drug may make you feel generally unwell. This is not uncommon, as chemotherapy can affect healthy cells as well as cancer cells. Report any side effects. Continue your course of treatment even though you feel ill unless your doctor tells you to stop. Drink water or other fluids as directed. Urinate often, even at night. In some cases, you may be given additional medicines to help with side effects. Follow all directions for their use. Call your doctor or health care professional for advice if you get a fever, chills or sore throat, or other symptoms of a cold or flu. Do not treat yourself. This drug decreases your body's ability to fight infections. Try to avoid being around people who are sick. This medicine may increase your risk to bruise or bleed. Call your doctor or health care professional if you notice any unusual bleeding. Be careful brushing and flossing your teeth or using a toothpick because you may get an infection or bleed more easily. If you have any dental work done, tell your dentist you are receiving this medicine. You may get drowsy or dizzy. Do not drive, use machinery, or do anything that needs mental alertness until you know how this medicine affects you. Do not become pregnant while taking this medicine or for 1 year after stopping it. Women should inform their doctor if they wish to  become pregnant or think they might be pregnant. Men should not father a child while taking this medicine and for 4 months after stopping it. There is a potential for serious side effects to an unborn child. Talk to your health care professional or pharmacist for more information. Do not breast-feed an infant while taking this medicine. This medicine may interfere with the ability to have a child. This medicine has caused ovarian failure in some women. This medicine has caused reduced sperm counts in some men. You should talk with your doctor or health care professional if you are concerned about your fertility. If you are going to have surgery, tell your doctor or health care professional that you have taken this medicine. What side effects may I notice from receiving this medicine?  Side effects that you should report to your doctor or health care professional as soon as possible:  allergic reactions like skin rash, itching or hives, swelling of the face, lips, or tongue  low blood counts - this medicine may decrease the number of white blood cells, red blood cells and platelets. You may be at increased risk for infections and bleeding.  signs of infection - fever or chills, cough, sore throat, pain or difficulty passing urine  signs of decreased platelets or bleeding - bruising, pinpoint red spots on the skin, black, tarry stools, blood in the urine  signs of decreased red blood cells - unusually weak or tired, fainting spells, lightheadedness  breathing problems  dark urine  dizziness  palpitations  swelling of the ankles, feet, hands  trouble passing urine or change in the amount of urine  weight gain  yellowing of the eyes or skin Side effects that usually do not require medical attention (report to your doctor or health care professional if they continue or are bothersome):  changes in nail or skin color  hair loss  missed menstrual periods  mouth sores  nausea,  vomiting This list may not describe all possible side effects. Call your doctor for medical advice about side effects. You may report side effects to FDA at 1-800-FDA-1088. Where should I keep my medicine? This drug is given in a hospital or clinic and will not be stored at home. NOTE: This sheet is a summary. It may not cover all possible information. If you have questions about this medicine, talk to your doctor, pharmacist, or health care provider.  2020 Elsevier/Gold Standard (2012-06-23 16:22:58)  Rituximab injection What is this medicine? RITUXIMAB (ri TUX i mab) is a monoclonal antibody. It is used to treat certain types of cancer like non-Hodgkin lymphoma and chronic lymphocytic leukemia. It is also used to treat rheumatoid arthritis, granulomatosis with polyangiitis (or Wegener's granulomatosis), microscopic polyangiitis, and pemphigus vulgaris. This medicine may be used for other purposes; ask your health care provider or pharmacist if you have questions. COMMON BRAND NAME(S): Rituxan, RUXIENCE What should I tell my health care provider before I take this medicine? They need to know if you have any of these conditions:  heart disease  infection (especially a virus infection such as hepatitis B, chickenpox, cold sores, or herpes)  immune system problems  irregular heartbeat  kidney disease  low blood counts, like low white cell, platelet, or red cell counts  lung or breathing disease, like asthma  recently received or scheduled to receive a vaccine  an unusual or allergic reaction to rituximab, other medicines, foods, dyes, or preservatives  pregnant or trying to get pregnant  breast-feeding How should I use this medicine? This medicine is for infusion into a vein. It is administered in a hospital or clinic by a specially trained health care professional. A special MedGuide will be given to you by the pharmacist with each prescription and refill. Be sure to read this  information carefully each time. Talk to your pediatrician regarding the use of this medicine in children. This medicine is not approved for use in children. Overdosage: If you think you have taken too much of this medicine contact a poison control center or emergency room at once. NOTE: This medicine is only for you. Do not share this medicine with others. What if I miss a dose? It is important not to miss a dose. Call your doctor or health care professional if you are unable to keep  an appointment. What may interact with this medicine?  cisplatin  live virus vaccines This list may not describe all possible interactions. Give your health care provider a list of all the medicines, herbs, non-prescription drugs, or dietary supplements you use. Also tell them if you smoke, drink alcohol, or use illegal drugs. Some items may interact with your medicine. What should I watch for while using this medicine? Your condition will be monitored carefully while you are receiving this medicine. You may need blood work done while you are taking this medicine. This medicine can cause serious allergic reactions. To reduce your risk you may need to take medicine before treatment with this medicine. Take your medicine as directed. In some patients, this medicine may cause a serious brain infection that may cause death. If you have any problems seeing, thinking, speaking, walking, or standing, tell your healthcare professional right away. If you cannot reach your healthcare professional, urgently seek other source of medical care. Call your doctor or health care professional for advice if you get a fever, chills or sore throat, or other symptoms of a cold or flu. Do not treat yourself. This drug decreases your body's ability to fight infections. Try to avoid being around people who are sick. Do not become pregnant while taking this medicine or for at least 12 months after stopping it. Women should inform their doctor  if they wish to become pregnant or think they might be pregnant. There is a potential for serious side effects to an unborn child. Talk to your health care professional or pharmacist for more information. Do not breast-feed an infant while taking this medicine or for at least 6 months after stopping it. What side effects may I notice from receiving this medicine? Side effects that you should report to your doctor or health care professional as soon as possible:  allergic reactions like skin rash, itching or hives; swelling of the face, lips, or tongue  breathing problems  chest pain  changes in vision  diarrhea  headache with fever, neck stiffness, sensitivity to light, nausea, or confusion  fast, irregular heartbeat  loss of memory  low blood counts - this medicine may decrease the number of white blood cells, red blood cells and platelets. You may be at increased risk for infections and bleeding.  mouth sores  problems with balance, talking, or walking  redness, blistering, peeling or loosening of the skin, including inside the mouth  signs of infection - fever or chills, cough, sore throat, pain or difficulty passing urine  signs and symptoms of kidney injury like trouble passing urine or change in the amount of urine  signs and symptoms of liver injury like dark yellow or brown urine; general ill feeling or flu-like symptoms; light-colored stools; loss of appetite; nausea; right upper belly pain; unusually weak or tired; yellowing of the eyes or skin  signs and symptoms of low blood pressure like dizziness; feeling faint or lightheaded, falls; unusually weak or tired  stomach pain  swelling of the ankles, feet, hands  unusual bleeding or bruising  vomiting Side effects that usually do not require medical attention (report to your doctor or health care professional if they continue or are bothersome):  headache  joint pain  muscle cramps or muscle  pain  nausea  tiredness This list may not describe all possible side effects. Call your doctor for medical advice about side effects. You may report side effects to FDA at 1-800-FDA-1088. Where should I keep my medicine? This  drug is given in a hospital or clinic and will not be stored at home. NOTE: This sheet is a summary. It may not cover all possible information. If you have questions about this medicine, talk to your doctor, pharmacist, or health care provider.  2020 Elsevier/Gold Standard (2018-09-20 22:01:36)

## 2019-04-05 ENCOUNTER — Telehealth: Payer: Self-pay | Admitting: Hematology

## 2019-04-05 ENCOUNTER — Telehealth: Payer: Self-pay | Admitting: *Deleted

## 2019-04-05 NOTE — Progress Notes (Signed)
Oncology Nurse Navigator Documentation  To provide support, encouragement and care continuity, met with Ms. Villela during [facility] appt with Dr. Maylon Peppers.  She begins systemic tmt today. She voiced understanding of systemic tmt plan. She voiced understanding XRT may be part of tmt plan following completion of chemotherapy. I encouraged her to call me with questions/concerns during this phase of tmt.  She voiced agreement.  [name], RN, BSN Head & Neck Oncology Nurse [place] at [place] [phone]

## 2019-04-05 NOTE — Telephone Encounter (Signed)
Called and left msg. Mailed printout  °

## 2019-04-05 NOTE — Telephone Encounter (Signed)
-----   Message from Rolene Course, RN sent at 04/04/2019  3:13 PM EDT ----- Regarding: Sandra Payne 1st Tx F/U call Sandra Payne 1st Tx F/U call

## 2019-04-05 NOTE — Telephone Encounter (Signed)
Called & left message for pt t return call to discuss how she did with her chemotherapy/Biotherapy yesterday.

## 2019-04-06 ENCOUNTER — Telehealth: Payer: Self-pay | Admitting: *Deleted

## 2019-04-06 ENCOUNTER — Other Ambulatory Visit: Payer: Self-pay

## 2019-04-06 ENCOUNTER — Inpatient Hospital Stay: Payer: PPO

## 2019-04-06 VITALS — BP 140/76 | HR 84 | Temp 98.5°F | Resp 18

## 2019-04-06 DIAGNOSIS — Z5189 Encounter for other specified aftercare: Secondary | ICD-10-CM | POA: Diagnosis not present

## 2019-04-06 DIAGNOSIS — C8331 Diffuse large B-cell lymphoma, lymph nodes of head, face, and neck: Secondary | ICD-10-CM

## 2019-04-06 MED ORDER — PEGFILGRASTIM-JMDB 6 MG/0.6ML ~~LOC~~ SOSY
6.0000 mg | PREFILLED_SYRINGE | Freq: Once | SUBCUTANEOUS | Status: AC
  Administered 2019-04-06: 6 mg via SUBCUTANEOUS
  Filled 2019-04-06: qty 0.6

## 2019-04-06 NOTE — Telephone Encounter (Signed)
Oncology Nurse Navigator Documentation  Returned Ms. Tennyson's VM re this afternoon's injection. Explained injection is Neulasta which helps with WBC, she will receive every week she receives RCHOP.   She indicated she took Claritin a short while ago per Dr. Lorette Ang guidance.  I encouraged her to ask RN this afternoon re schedule for additional doses post-injection.  She voiced understanding. Encouraged her to call me with questions/concerns as she proceeds with tmt.  She agreed.  [name], RN, BSN Head & Neck Oncology Nurse [place] at [place] [phone]

## 2019-04-06 NOTE — Patient Instructions (Signed)

## 2019-04-08 ENCOUNTER — Other Ambulatory Visit: Payer: Self-pay

## 2019-04-08 ENCOUNTER — Ambulatory Visit
Admission: EM | Admit: 2019-04-08 | Discharge: 2019-04-08 | Disposition: A | Discharge status: Home / Self Care | Payer: PPO | Attending: Family Medicine | Admitting: Family Medicine

## 2019-04-08 DIAGNOSIS — C8511 Unspecified B-cell lymphoma, lymph nodes of head, face, and neck: Secondary | ICD-10-CM

## 2019-04-08 DIAGNOSIS — J029 Acute pharyngitis, unspecified: Secondary | ICD-10-CM

## 2019-04-08 DIAGNOSIS — Z7189 Other specified counseling: Secondary | ICD-10-CM

## 2019-04-08 HISTORY — DX: Non-Hodgkin lymphoma, unspecified, unspecified site: C85.90

## 2019-04-08 LAB — RAPID STREP SCREEN (MED CTR MEBANE ONLY): Streptococcus, Group A Screen (Direct): NEGATIVE

## 2019-04-08 NOTE — Discharge Instructions (Addendum)
Continue your warm tea. Tylenol as needed. Check your temperature before you take Tylenol to make sure no fever.  Rest. Drink plenty of fluids.  Remain home unless seeking further care.  Call your oncologist first thing tomorrow to follow-up.  Follow up with your primary care physician this week as needed. Return to Urgent care for new or worsening concerns.

## 2019-04-08 NOTE — ED Triage Notes (Signed)
Pt states she got her first cancer injection Friday and her throat started hurting Friday night. Thinks it's likely related to the treatment but wants to be sure

## 2019-04-08 NOTE — ED Provider Notes (Addendum)
[facility] ____________________________________________  Time seen: Approximately 9:37 AM  I have reviewed the triage vital signs and the nursing notes.   HISTORY  Chief Complaint Sore Throat  HPI Sandra Payne is a 78 y.o. 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  First and most recent chemotherapy injection was this past Wednesday and then she was seen and given a Neulasta injection on Friday.  States Friday night she began with a sore throat.  Patient states that on Friday and Saturday the sore throat was moderate.  States the sore throat is minimal currently.  Patient reports that yesterday she drank some warm tea, rubbed Aspercreme on her neck and took a Tylenol.  States waking up this morning she feels much better but still wanted to be checked.  Did call oncology nurse today and they recommended her to be seen to make sure doing okay.  Denies any other changes in medications or contacts.  No difficulty swallowing only some discomfort.  No lip or oral swelling.  Denies chest pain or shortness of breath.  No known fevers.  Reports food and drink continues to taste normally.  Denies rash, abdominal pain,nasal congestion, or known sick contacts or other complaints.   Jonathon Jordan, MD: PCP   Past Medical History:  Diagnosis Date  . Anemia   . Arthralgia   . Arthritis   . Elevated cholesterol   . Insomnia   . Lymphoma ([place])   . Neck mass    left    Patient Active Problem List   Diagnosis Date Noted  . Thrombocytosis ([place]) 03/21/2019  . DLBCL (diffuse large B cell lymphoma) ([place]) 03/19/2019  . Arthritis of knee 01/06/2015  . Primary osteoarthritis of right knee 01/05/2015  . Abnormality of gait 12/03/2014  . Benign paroxysmal positional vertigo 12/03/2014  . Paresthesias 12/03/2014    Past Surgical  History:  Procedure Laterality Date  . ABDOMINAL HYSTERECTOMY     partial  . IR IMAGING GUIDED PORT INSERTION  03/28/2019  . MASS BIOPSY Left 03/12/2019   Procedure: NECK MASS BIOPSY;  Surgeon: Izora Gala, MD;  Location: [facility];  Service: ENT;  Laterality: Left;  . TOTAL KNEE ARTHROPLASTY Right 01/06/2015   Procedure: RIGHT TOTAL KNEE ARTHROPLASTY;  Surgeon: Frederik Pear, MD;  Location: [facility];  Service: Orthopedics;  Laterality: Right;     No current facility-administered medications for this encounter.   Current Outpatient Medications:  .  pegfilgrastim (NEULASTA ONPRO KIT) 6 MG/0.6ML injection, Inject 6 mg into the skin once. Inject via provided programmed delivery device., Disp: , Rfl:  .  acetaminophen (TYLENOL) 325 MG tablet, Take 650 mg by mouth every 6 (six) hours as needed., Disp: , Rfl:  .  allopurinol (ZYLOPRIM) 300 MG tablet, Take 1 tablet (300 mg total) by mouth daily., Disp: 30 tablet, Rfl: 3 .  Ascorbic Acid (VITAMIN C) 100 MG tablet, Take 100 mg by mouth daily., Disp: , Rfl:  .  Celecoxib (CELEBREX PO), Take by mouth., Disp: , Rfl:  .  HYDROcodone-acetaminophen (NORCO) 7.5-325 MG tablet, Take 1 tablet by mouth every 6 (six) hours as needed for moderate pain., Disp: 20 tablet, Rfl: 0 .  lidocaine-prilocaine (EMLA) cream, Apply to affected area once (Patient not taking: Reported on 04/04/2019), Disp: 30 g, Rfl: 3 .  Multiple Vitamin (MULTIVITAMIN) tablet, Take 1 tablet by mouth daily.,  Disp: , Rfl:  .  ondansetron (ZOFRAN) 8 MG tablet, Take 1 tablet (8 mg total) by mouth 2 (two) times daily as needed for refractory nausea / vomiting. Start on day 3 after cyclophosphamide chemotherapy. (Patient not taking: Reported on 04/04/2019), Disp: 30 tablet, Rfl: 1 .  predniSONE (DELTASONE) 20 MG tablet, Take 20 mg by mouth daily. Take 5 tablets (126m) by mouth daily for 5 days. Take on days 1-5 of chemotherapy, Disp: , Rfl:  .  prochlorperazine (COMPAZINE) 10 MG tablet, Take  1 tablet (10 mg total) by mouth every 6 (six) hours as needed (Nausea or vomiting). (Patient not taking: Reported on 04/04/2019), Disp: 30 tablet, Rfl: 6 .  traMADol (ULTRAM) 50 MG tablet, Take by mouth every 6 (six) hours as needed., Disp: , Rfl:  .  vitamin B-12 (CYANOCOBALAMIN) 100 MCG tablet, Take 100 mcg by mouth daily., Disp: , Rfl:  .  zolpidem (AMBIEN) 5 MG tablet, Take 5 mg by mouth at bedtime as needed for sleep., Disp: , Rfl:   Allergies Codeine and Sulfa antibiotics  Family History  Problem Relation Age of Onset  . Diabetes Mother   . Hypertension Mother   . Breast cancer Neg Hx     Social History Social History   Tobacco Use  . Smoking status: Former Smoker    Packs/day: 0.25    Years: 2.00    Pack years: 0.50    Types: Cigarettes  . Smokeless tobacco: Never Used  Substance Use Topics  . Alcohol use: Yes    Alcohol/week: 0.0 standard drinks    Comment: occasionally  . Drug use: No    Review of Systems Constitutional: No fever ENT: Positive sore throat. Cardiovascular: Denies chest pain. Respiratory: Denies shortness of breath. Gastrointestinal: No abdominal pain.  No nausea, no vomiting.  Genitourinary: Negative for dysuria. Skin: Negative for rash.   ____________________________________________   PHYSICAL EXAM:  VITAL SIGNS: ED Triage Vitals  Enc Vitals Group     BP 04/08/19 0913 (!) 155/80     Pulse Rate 04/08/19 0913 93     Resp 04/08/19 0913 16     Temp 04/08/19 0913 98.5 F (36.9 C)     Temp Source 04/08/19 0913 Oral     SpO2 04/08/19 0913 99 %     Weight 04/08/19 0915 131 lb 8 oz (59.6 kg)     Height 04/08/19 0915 '4\' 10"'  (1.473 m)     Head Circumference --      Peak Flow --      Pain Score 04/08/19 0915 7     Pain Loc --      Pain Edu? --      Excl. in [place] --     Constitutional: Alert and oriented. Well appearing and in no acute distress. Eyes: Conjunctivae are normal.  ENT      Head: Normocephalic and atraumatic.      Nose: No  congestion      Mouth/Throat: Mucous membranes are moist.Oropharynx non-erythematous.  No tonsillar swelling or exudate.  No lip, tongue or oral pharyngeal edema noted. Neck: No stridor. Hematological/Lymphatic/Immunilogical: Diffuse cervical lymphadenopathy.  Healing and well approximated left supra clavicular incision site. Mild diffuse tenderness to anterior neck. Cardiovascular: Normal rate, regular rhythm. Grossly normal heart sounds.  Good peripheral circulation. Respiratory: Normal respiratory effort without tachypnea nor retractions. Breath sounds are clear and equal bilaterally. No wheezes, rales, rhonchi. Musculoskeletal: Steady gait. Neurologic:  Normal speech and language. Speech is normal. No gait instability.  Skin:  Skin is warm, dry and intact. No rash noted. Psychiatric: Mood and affect are normal. Speech and behavior are normal. Patient exhibits appropriate insight and judgment   ___________________________________________   LABS (all labs ordered are listed, but only abnormal results are displayed)  Labs Reviewed  RAPID STREP SCREEN ([facility] ONLY)  NOVEL CORONAVIRUS, NAA (HOSPITAL ORDER, SEND-OUT TO REF LAB)  CULTURE, GROUP A STREP [facility])     PROCEDURES Procedures   INITIAL IMPRESSION / Standard / ED COURSE  Pertinent labs & imaging results that were available during my care of the patient were reviewed by me and considered in my medical decision making (see chart for details).  Well-appearing patient.  No acute distress.  Presenting for evaluation of sore throat.  Patient reports sore throat has improved.  Denies any other accompanying symptoms and no fevers.  No angioedema. patient with large B-cell lymphoma of lymph nodes of neck undergoing chemotherapy with just having started chemotherapy this past week.  Strep negative, will culture.  Patient continues to tolerate food and fluids well.  COVID-19 testing completed and will await test results.   Concern current sore throat may be side effect from chemotherapy medications, recommend for call to follow-up with oncology tomorrow.  [place] DHHS information regarding COVID-19 also given, remain home unless seeking further care.  Discussed very strict follow-up and return parameters.  Discussed follow up with Primary care physician this week. Discussed follow up and return parameters including no resolution or any worsening concerns. Patient verbalized understanding and agreed to plan.   ____________________________________________   FINAL CLINICAL IMPRESSION(S) / ED DIAGNOSES  Final diagnoses:  Pharyngitis, unspecified etiology  Advice Given About Covid-19 Virus Infection  B-cell lymphoma of lymph nodes of neck, unspecified B-cell lymphoma type [facility])     ED Discharge Orders    None       Note: This dictation was prepared with Dragon dictation along with smaller phrase technology. Any transcriptional errors that result from this process are unintentional.         Marylene Land, NP 04/08/19 1023

## 2019-04-09 ENCOUNTER — Telehealth: Payer: Self-pay | Admitting: *Deleted

## 2019-04-09 ENCOUNTER — Other Ambulatory Visit: Payer: Self-pay | Admitting: Hematology

## 2019-04-09 DIAGNOSIS — J029 Acute pharyngitis, unspecified: Secondary | ICD-10-CM

## 2019-04-09 LAB — NOVEL CORONAVIRUS, NAA (HOSP ORDER, SEND-OUT TO REF LAB; TAT 18-24 HRS): SARS-CoV-2, NAA: NOT DETECTED

## 2019-04-09 MED ORDER — AMOXICILLIN-POT CLAVULANATE 875-125 MG PO TABS: 1.0000 | ORAL_TABLET | Freq: Two times a day (BID) | ORAL | 0 refills | Status: AC

## 2019-04-09 NOTE — Telephone Encounter (Signed)
I have sent a prescription for Augmentin to her pharmacy. I do not think it's related to Neulasta. Thanks.   Dr. Maylon Peppers

## 2019-04-09 NOTE — Telephone Encounter (Signed)
Oncology Nurse Navigator Documentation  Rec'd call from pt.   She reported onset this weekend following Friday's Neulasta injection of sore throat, dull HA and temporary tingling sensation at temporomandibular joint when eating ("like when you eat something sour").  She denied fever, cough. She went to Urgent Care, tested negative for Strep, was swabbed for COVID. I explained I was unfamiliar with these symptoms to be r/t Neulasta, would check with Infusion, get back to her.  Addendum 1434:  Called Sandra Payne.  She indicated dull HA and sore throat persist.  She placed called to [facility] Triage, is expecting call-back.  Dr. Maylon Peppers informed.  [name], RN, BSN Head & Neck Oncology Nurse [place] at [place] [phone]

## 2019-04-09 NOTE — Telephone Encounter (Signed)
Spoke with pt and informed pt of script for Augmentin sent to her pharmacy by Dr. Maylon Peppers.  Instructed pt to start antibiotic today.  Pt understood to call office back early tomorrow am if symptoms worsened. Informed pt to call office ASAP if she develops white plaques in her throat ,  and/or develops fever as per Dr. Lorette Ang instructions.  Pt voiced understanding.

## 2019-04-10 ENCOUNTER — Encounter (HOSPITAL_COMMUNITY): Payer: Self-pay | Admitting: Hematology

## 2019-04-10 LAB — CULTURE, GROUP A STREP (THRC)

## 2019-04-11 ENCOUNTER — Encounter (HOSPITAL_COMMUNITY): Payer: Self-pay

## 2019-04-12 ENCOUNTER — Telehealth: Payer: Self-pay | Admitting: *Deleted

## 2019-04-12 NOTE — Telephone Encounter (Signed)
Oncology Nurse Navigator Documentation  Rec'd call from [name].  She reported new onset of mild lower gum soreness, stated no blisters or white patchiness evident on gums or in mouth. She stated finds some relief with salt water rinses.  I suggested she try salt/baking soda rinse to see if additional relief, use frequently throughout the day.  Provided recipe. She further noted almost finished with Augmentin, experiencing relief of "jaw tingling", has been afebrile, only an occasional mild HA. I encouraged her to call if gum soreness continues or evidence of white patches in mouth, fevers.  She voiced understanding.  Sandra Orem, RN, BSN Head & Neck Oncology Nurse [place] at [place] [phone]

## 2019-04-13 ENCOUNTER — Other Ambulatory Visit: Payer: Self-pay | Admitting: Family

## 2019-04-13 ENCOUNTER — Telehealth: Payer: Self-pay | Admitting: *Deleted

## 2019-04-13 DIAGNOSIS — M545 Low back pain, unspecified: Secondary | ICD-10-CM

## 2019-04-13 DIAGNOSIS — C8331 Diffuse large B-cell lymphoma, lymph nodes of head, face, and neck: Secondary | ICD-10-CM

## 2019-04-13 DIAGNOSIS — C8591 Non-Hodgkin lymphoma, unspecified, lymph nodes of head, face, and neck: Secondary | ICD-10-CM

## 2019-04-13 MED ORDER — HYDROCODONE-ACETAMINOPHEN 7.5-325 MG PO TABS: 1.0000 | ORAL_TABLET | Freq: Four times a day (QID) | ORAL | 0 refills | Status: DC | PRN

## 2019-04-13 NOTE — Telephone Encounter (Signed)
I agree with pain medication as needed. Tramadol is reasonable for mild pain, and Norco for severe pain if not responding to tramadol.  Thanks.   Dr. Maylon Peppers

## 2019-04-13 NOTE — Telephone Encounter (Signed)
Oncology Nurse Navigator Documentation  Called Ms. Sandra Payne, informed her Norco Rx sent to [facility].  She acknowledged.  [name], RN, BSN Head & Neck Oncology Nurse [place] at [place] [phone]

## 2019-04-13 NOTE — Telephone Encounter (Signed)
Spoke with pt.  She took Tramadol ca 2 hrs ago, minimal relief (pain 6/10).  She requested refill for Norco 7.7 - 325 to be sent to [facility].  Has 1 tab available.

## 2019-04-13 NOTE — Telephone Encounter (Signed)
Oncology Nurse Navigator Documentation  Ms. Birden call this morning.  Reported:  Onset last evening of intermittent/throbbing low back / hip pain L > R, rated 7-8/10, occurrence somewhat r/t to positioning.    Has not taken analgesic; has Tylenol, Norco and Ultram available.   Has been taking Claritin as directed.  I explained:  Pain in large bones a common SE of Neulasta which she received last Friday.  Usual guidance for patients receiving chemo is not take acetaminophen in order to avoid masking fevers.   I suggested she consider taking Ultram but will confirm with Dr. Maylon Peppers. She further noted sense of taste is diminished, she is trying to "eat the right foods".  She requested nutritional guidance  which I provided but I indicated Nutrition referral would be made. She voiced understanding I will notify Dr. Maylon Peppers re hip pain and nutrition referral, will call her with follow-up.  [name], RN, BSN Head & Neck Oncology Nurse [place] at [place] [phone]

## 2019-04-17 ENCOUNTER — Telehealth: Payer: Self-pay

## 2019-04-17 NOTE — Telephone Encounter (Signed)
Pt called with complaints of "discomfort, not pain underneath right shoulder, kind of like a knot."  Pt states she has had discomfort for a few days and that it comes and goes. Pt wants to know if there is something she could take for the discomfort to go away.  Pt also stated that she has been taking a half tab of the zofran tablets and they have not been helping with her nausea. Pt advised to try her compazine pills to see if they will help with the nausea.   Routed to MD for advice.

## 2019-04-19 ENCOUNTER — Ambulatory Visit
Admission: RE | Admit: 2019-04-19 | Discharge: 2019-04-19 | Disposition: A | Discharge status: Home / Self Care | Payer: PPO | Source: Ambulatory Visit | Attending: Family Medicine | Admitting: Family Medicine

## 2019-04-19 ENCOUNTER — Other Ambulatory Visit: Payer: Self-pay

## 2019-04-19 DIAGNOSIS — Z1231 Encounter for screening mammogram for malignant neoplasm of breast: Secondary | ICD-10-CM

## 2019-04-20 ENCOUNTER — Telehealth: Payer: Self-pay | Admitting: *Deleted

## 2019-04-21 NOTE — Telephone Encounter (Signed)
Oncology Nurse Navigator Documentation  Rec'd call from [name], answered questions re upcoming appts.   [name], RN, BSN Head & Neck Oncology Nurse [place] at [place] [phone]

## 2019-04-23 ENCOUNTER — Telehealth: Payer: Self-pay | Admitting: Hematology

## 2019-04-23 NOTE — Telephone Encounter (Signed)
Per 8/31 schedule message change future appointments to rapid rituxan at 4.5 hr. Infusion appointments and notes updated. Not change in appointment times.

## 2019-04-23 NOTE — Progress Notes (Signed)
Rapid Infusion Rituximab Pharmacist Evaluation  Patients may be eligible for Rapid Infusion Rituximab (RIR) if they have no significant cardiac disease, no risk for Tumor Lysis Syndrome (TLS), received rituximab within the last 6 months, and tolerated those infusions per standard protocol without grade 3-4 infusion reactions. A pharmacist has verified the patient tolerated rituximab infusions per the [facility] standard infusion protocol without grade 3-4 infusion reactions. The treatment plan will be updated to reflect RIR if the patient qualifies per the checklist below.   Sandra Payne is a 78 y.o. female being treated with rituximab for DLBCL. This patient may be considered for RIR.    Age > 56 years old Yes   Stable renal, hepatic, and hematologic function Yes   Recent Pertinent Lab Values  Lab Results  Component Value Date   CREATININE 0.70 04/03/2019   BILITOT 0.5 04/03/2019   Lab Results  Component Value Date   WBC 5.0 04/03/2019   LYMPHSABS 1.2 04/03/2019   PLT 351 04/03/2019     Prior documented reaction to rituximab No   Previous rituximab infusion within 6 months No   Physician approval of RIR Yes   Treatment Plan updated orders to reflect RIR Yes  - starting 04/25/19    Sandra Payne 04/23/19 1:02 PM

## 2019-04-25 ENCOUNTER — Ambulatory Visit: Payer: PPO

## 2019-04-25 ENCOUNTER — Encounter: Payer: Self-pay | Admitting: Hematology

## 2019-04-25 ENCOUNTER — Inpatient Hospital Stay: Payer: PPO | Attending: Hematology | Admitting: Hematology

## 2019-04-25 ENCOUNTER — Inpatient Hospital Stay: Payer: PPO

## 2019-04-25 ENCOUNTER — Other Ambulatory Visit: Payer: Self-pay

## 2019-04-25 VITALS — BP 100/88 | HR 90 | Temp 98.3°F | Resp 20

## 2019-04-25 VITALS — BP 119/69 | HR 87 | Temp 97.8°F | Resp 18 | Ht <= 58 in | Wt 127.7 lb

## 2019-04-25 DIAGNOSIS — T451X5A Adverse effect of antineoplastic and immunosuppressive drugs, initial encounter: Secondary | ICD-10-CM | POA: Diagnosis not present

## 2019-04-25 DIAGNOSIS — G4700 Insomnia, unspecified: Secondary | ICD-10-CM | POA: Diagnosis not present

## 2019-04-25 DIAGNOSIS — C8331 Diffuse large B-cell lymphoma, lymph nodes of head, face, and neck: Secondary | ICD-10-CM

## 2019-04-25 DIAGNOSIS — M898X9 Other specified disorders of bone, unspecified site: Secondary | ICD-10-CM | POA: Diagnosis not present

## 2019-04-25 DIAGNOSIS — D473 Essential (hemorrhagic) thrombocythemia: Secondary | ICD-10-CM | POA: Diagnosis not present

## 2019-04-25 DIAGNOSIS — Z5111 Encounter for antineoplastic chemotherapy: Secondary | ICD-10-CM | POA: Insufficient documentation

## 2019-04-25 DIAGNOSIS — Z79899 Other long term (current) drug therapy: Secondary | ICD-10-CM | POA: Insufficient documentation

## 2019-04-25 DIAGNOSIS — Z5189 Encounter for other specified aftercare: Secondary | ICD-10-CM | POA: Diagnosis not present

## 2019-04-25 DIAGNOSIS — R110 Nausea: Secondary | ICD-10-CM | POA: Insufficient documentation

## 2019-04-25 DIAGNOSIS — D6481 Anemia due to antineoplastic chemotherapy: Secondary | ICD-10-CM | POA: Insufficient documentation

## 2019-04-25 DIAGNOSIS — K5900 Constipation, unspecified: Secondary | ICD-10-CM | POA: Insufficient documentation

## 2019-04-25 DIAGNOSIS — R109 Unspecified abdominal pain: Secondary | ICD-10-CM | POA: Diagnosis not present

## 2019-04-25 DIAGNOSIS — G893 Neoplasm related pain (acute) (chronic): Secondary | ICD-10-CM | POA: Insufficient documentation

## 2019-04-25 DIAGNOSIS — Z23 Encounter for immunization: Secondary | ICD-10-CM | POA: Insufficient documentation

## 2019-04-25 DIAGNOSIS — Z87891 Personal history of nicotine dependence: Secondary | ICD-10-CM | POA: Insufficient documentation

## 2019-04-25 DIAGNOSIS — D75839 Thrombocytosis, unspecified: Secondary | ICD-10-CM

## 2019-04-25 DIAGNOSIS — F19982 Other psychoactive substance use, unspecified with psychoactive substance-induced sleep disorder: Secondary | ICD-10-CM

## 2019-04-25 DIAGNOSIS — Z5112 Encounter for antineoplastic immunotherapy: Secondary | ICD-10-CM | POA: Diagnosis not present

## 2019-04-25 LAB — CMP (CANCER CENTER ONLY)
ALT: 16 U/L (ref 0–44)
AST: 18 U/L (ref 15–41)
Albumin: 3.6 g/dL (ref 3.5–5.0)
Alkaline Phosphatase: 68 U/L (ref 38–126)
Anion gap: 10 (ref 5–15)
BUN: 10 mg/dL (ref 8–23)
CO2: 26 mmol/L (ref 22–32)
Calcium: 9.4 mg/dL (ref 8.9–10.3)
Chloride: 105 mmol/L (ref 98–111)
Creatinine: 0.73 mg/dL (ref 0.44–1.00)
GFR, Est AFR Am: >60 mL/min (ref >60)
GFR, Estimated: >60 mL/min (ref >60)
Glucose, Bld: 96 mg/dL (ref 70–99)
Potassium: 4.1 mmol/L (ref 3.5–5.1)
Sodium: 141 mmol/L (ref 135–145)
Total Bilirubin: <0.2 mg/dL — ABNORMAL LOW (ref 0.3–1.2)
Total Protein: 6.8 g/dL (ref 6.5–8.1)

## 2019-04-25 LAB — CBC WITH DIFFERENTIAL (CANCER CENTER ONLY)
Abs Immature Granulocytes: 0.08 10*3/uL — ABNORMAL HIGH (ref 0.00–0.07)
Basophils Absolute: 0 10*3/uL (ref 0.0–0.1)
Basophils Relative: 1 %
Eosinophils Absolute: 0.1 10*3/uL (ref 0.0–0.5)
Eosinophils Relative: 2 %
HCT: 38.8 % (ref 36.0–46.0)
Hemoglobin: 12.2 g/dL (ref 12.0–15.0)
Immature Granulocytes: 2 %
Lymphocytes Relative: 15 %
Lymphs Abs: 0.7 10*3/uL (ref 0.7–4.0)
MCH: 26.2 pg (ref 26.0–34.0)
MCHC: 31.4 g/dL (ref 30.0–36.0)
MCV: 83.4 fL (ref 80.0–100.0)
Monocytes Absolute: 0.9 10*3/uL (ref 0.1–1.0)
Monocytes Relative: 19 %
Neutro Abs: 3.2 10*3/uL (ref 1.7–7.7)
Neutrophils Relative %: 61 %
Platelet Count: 456 10*3/uL — ABNORMAL HIGH (ref 150–400)
RBC: 4.65 MIL/uL (ref 3.87–5.11)
RDW: 16.6 % — ABNORMAL HIGH (ref 11.5–15.5)
WBC Count: 5.1 10*3/uL (ref 4.0–10.5)
nRBC: 0 % (ref 0.0–0.2)

## 2019-04-25 MED ORDER — TRAZODONE HCL 50 MG PO TABS: 50.0000 mg | ORAL_TABLET | Freq: Every evening | ORAL | 5 refills | Status: DC | PRN

## 2019-04-25 MED ORDER — HEPARIN SOD (PORK) LOCK FLUSH 100 UNIT/ML IV SOLN
500.0000 [IU] | Freq: Once | INTRAVENOUS | Status: AC | PRN
  Administered 2019-04-25: 500 [IU]
  Filled 2019-04-25: qty 5

## 2019-04-25 MED ORDER — DIPHENHYDRAMINE HCL 25 MG PO CAPS
50.0000 mg | ORAL_CAPSULE | Freq: Once | ORAL | Status: AC
  Administered 2019-04-25: 50 mg via ORAL

## 2019-04-25 MED ORDER — DIPHENHYDRAMINE HCL 25 MG PO CAPS
ORAL_CAPSULE | ORAL | Status: AC
  Filled 2019-04-25: qty 2

## 2019-04-25 MED ORDER — SODIUM CHLORIDE 0.9 % IV SOLN
Freq: Once | INTRAVENOUS | Status: AC
  Administered 2019-04-25: 12:00:00 via INTRAVENOUS
  Filled 2019-04-25: qty 250

## 2019-04-25 MED ORDER — DOXORUBICIN HCL CHEMO IV INJECTION 2 MG/ML
50.0000 mg/m2 | Freq: Once | INTRAVENOUS | Status: AC
  Administered 2019-04-25: 80 mg via INTRAVENOUS
  Filled 2019-04-25: qty 40

## 2019-04-25 MED ORDER — PALONOSETRON HCL INJECTION 0.25 MG/5ML
0.2500 mg | Freq: Once | INTRAVENOUS | Status: AC
  Administered 2019-04-25: 0.25 mg via INTRAVENOUS

## 2019-04-25 MED ORDER — ACETAMINOPHEN 325 MG PO TABS
650.0000 mg | ORAL_TABLET | Freq: Once | ORAL | Status: AC
  Administered 2019-04-25: 650 mg via ORAL

## 2019-04-25 MED ORDER — SODIUM CHLORIDE 0.9% FLUSH
10.0000 mL | INTRAVENOUS | Status: DC | PRN
  Administered 2019-04-25: 10 mL
  Filled 2019-04-25: qty 10

## 2019-04-25 MED ORDER — SODIUM CHLORIDE 0.9 % IV SOLN
375.0000 mg/m2 | Freq: Once | INTRAVENOUS | Status: AC
  Administered 2019-04-25: 600 mg via INTRAVENOUS
  Filled 2019-04-25: qty 10

## 2019-04-25 MED ORDER — ACETAMINOPHEN 325 MG PO TABS
ORAL_TABLET | ORAL | Status: AC
  Filled 2019-04-25: qty 2

## 2019-04-25 MED ORDER — PALONOSETRON HCL INJECTION 0.25 MG/5ML
INTRAVENOUS | Status: AC
  Filled 2019-04-25: qty 5

## 2019-04-25 MED ORDER — VINCRISTINE SULFATE CHEMO INJECTION 1 MG/ML
2.0000 mg | Freq: Once | INTRAVENOUS | Status: AC
  Administered 2019-04-25: 2 mg via INTRAVENOUS
  Filled 2019-04-25: qty 2

## 2019-04-25 MED ORDER — SODIUM CHLORIDE 0.9 % IV SOLN
750.0000 mg/m2 | Freq: Once | INTRAVENOUS | Status: AC
  Administered 2019-04-25: 14:00:00 1180 mg via INTRAVENOUS
  Filled 2019-04-25: qty 59

## 2019-04-25 MED ORDER — SODIUM CHLORIDE 0.9 % IV SOLN
Freq: Once | INTRAVENOUS | Status: AC
  Administered 2019-04-25: 13:00:00 via INTRAVENOUS
  Filled 2019-04-25: qty 5

## 2019-04-25 NOTE — Patient Instructions (Signed)

## 2019-04-25 NOTE — Patient Instructions (Signed)
[facility] Discharge Instructions for Patients Receiving Chemotherapy  Today you received the following chemotherapy agents Doxorubicin (ADRIAMYCIN), Vincristine (ONCOVIN), Cyclophosphamide (CYTOXAN) & Rituximab (RITUXAN).  To help prevent nausea and vomiting after your treatment, we encourage you to take your nausea medication as prescribed.   If you develop nausea and vomiting that is not controlled by your nausea medication, call the clinic.   BELOW ARE SYMPTOMS THAT SHOULD BE REPORTED IMMEDIATELY:  *FEVER GREATER THAN 100.5 F  *CHILLS WITH OR WITHOUT FEVER  NAUSEA AND VOMITING THAT IS NOT CONTROLLED WITH YOUR NAUSEA MEDICATION  *UNUSUAL SHORTNESS OF BREATH  *UNUSUAL BRUISING OR BLEEDING  TENDERNESS IN MOUTH AND THROAT WITH OR WITHOUT PRESENCE OF ULCERS  *URINARY PROBLEMS  *BOWEL PROBLEMS  UNUSUAL RASH Items with * indicate a potential emergency and should be followed up as soon as possible.  Feel free to call the clinic should you have any questions or concerns. The clinic phone number is [phone].  Please show the [place] at check-in to the Emergency Department and triage nurse.  Coronavirus (COVID-19) Are you at risk?  Are you at risk for the Coronavirus (COVID-19)?  To be considered HIGH RISK for Coronavirus (COVID-19), you have to meet the following criteria:  . Traveled to [place], [place], [place], [place] or [place]; or in the [place] to [place], [place], [place], or [place]; and have fever, cough, and shortness of breath within the last 2 weeks of travel OR . Been in close contact with a person diagnosed with COVID-19 within the last 2 weeks and have fever, cough, and shortness of breath . IF YOU DO NOT MEET THESE CRITERIA, YOU ARE CONSIDERED LOW RISK FOR COVID-19.  What to do if you are HIGH RISK for COVID-19?  [name] Kitchen If you are having a medical emergency, call 911. . Seek medical care right away. Before you go to a  doctor's office, urgent care or emergency department, call ahead and tell them about your recent travel, contact with someone diagnosed with COVID-19, and your symptoms. You should receive instructions from your physician's office regarding next steps of care.  . When you arrive at healthcare provider, tell the healthcare staff immediately you have returned from visiting [place], [place], [place], [place] or [place]; or traveled in the [place] to [place], [place], [place], or [place]; in the last two weeks or you have been in close contact with a person diagnosed with COVID-19 in the last 2 weeks.   . Tell the health care staff about your symptoms: fever, cough and shortness of breath. . After you have been seen by a medical provider, you will be either: o Tested for (COVID-19) and discharged home on quarantine except to seek medical care if symptoms worsen, and asked to  - Stay home and avoid contact with others until you get your results (4-5 days)  - Avoid travel on public transportation if possible (such as bus, train, or airplane) or o Sent to the Emergency Department by EMS for evaluation, COVID-19 testing, and possible admission depending on your condition and test results.  What to do if you are LOW RISK for COVID-19?  Reduce your risk of any infection by using the same precautions used for avoiding the common cold or flu:  [name] Kitchen Wash your hands often with soap and warm water for at least 20 seconds.  If soap and water are not readily available, use an alcohol-based hand sanitizer with at least  60% alcohol.  . If coughing or sneezing, cover your mouth and nose by coughing or sneezing into the elbow areas of your shirt or coat, into a tissue or into your sleeve (not your hands). . Avoid shaking hands with others and consider head nods or verbal greetings only. . Avoid touching your eyes, nose, or mouth with unwashed hands.  . Avoid close contact with people who are sick. . Avoid  places or events with large numbers of people in one location, like concerts or sporting events. . Carefully consider travel plans you have or are making. . If you are planning any travel outside or inside the [place], visit the CDC's Travelers' Health webpage for the latest health notices. . If you have some symptoms but not all symptoms, continue to monitor at home and seek medical attention if your symptoms worsen. . If you are having a medical emergency, call 911.   [place] / e-Visit: [email or web address]         [facility]: [facility]: [id number]                   [facility]: [phone]

## 2019-04-25 NOTE — Progress Notes (Signed)
[place] OFFICE PROGRESS NOTE  Patient Care Team: Jonathon Jordan, MD as PCP - General (Family Medicine) Tish Men, MD as Consulting Physician (Hematology) Eppie Gibson, MD as Attending Physician (Radiation Oncology) Leota Sauers, RN as Oncology Nurse Navigator  HEME/ONC OVERVIEW: 1. 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 BCL2 rearrangement positive, no BCL6 or Myc rearrangement -03/2019: PET showed bulky left supraclavicular as well as left axillary, RP, mesenteric and iliac adenopathy; bone marrow bx negative for lymphoma  -Mid-03/2019 - present: R-CHOP with G-CSF support   2. Port placed in 03/2019  TREATMENT REGIMEN:  04/04/2019 - present: R-CHOP with Udenyca   ASSESSMENT & PLAN:   Stage III DLBCL; BCL2 rearrangement+ -S/p 1 cycle of R-CHOP with G-CSF  -Clinically, the bulky left supraclavicular LN's are improving size  -Labs adequate today, proceed with Cycle 2 of chemotherapy -We will repeat PET after 3 cycles of chemotherapy to assess interim response -If she has good response, she will need consolidative RT after chemotherapy due to the initial bulky disease  -PRN anti-emetics: Zofran, Compazine, and Ativan   Thrombocytosis -Likely reactive in the setting of lymphoma and G-CSF -Plts 456k today, higher than the last visit  -We will monitor it for now  Generalized bone pain -Secondary to G-CSF -Patient has been taking hydrocodone/acetaminophen for bone pain -I recommended the patient to take daily Claritin, and to try PRN tramadol first for bone pain instead of Norco due to the Tylenol component, which may mask fever  Insomnia -Secondary to steroid -I discussed with the pain at length regarding the importance of sleep hygiene, including avoiding caffeinated  drinks and television/books before bedtime -In addition, given the patient's age, I cautioned her against taking Ambien due to the increased risk of adverse side effects in the elderly population -I have prescribed PRN trazodone for insomnia, and also recommended her to consider OTC melatonin, which she can take with trazodone  No orders of the defined types were placed in this encounter.  All questions were answered. The patient knows to call the clinic with any problems, questions or concerns. No barriers to learning was detected.  Return in 3 weeks for labs, port flush, clinic appointment, and Cycle 3 of R-CHOP.  Tish Men, MD 04/25/2019 9:37 AM  CHIEF COMPLAINT: "My body just hurts"  INTERVAL HISTORY: Sandra Payne returns to clinic for follow-up of DLBCL of the left neck on R-CHOP.  The patient reports that she had persistent bony achiness for about 2 weeks after the first cycle of chemotherapy, for which she took Claritin as well as PRN Norco for bone pain.  She has tramadol, but did not take it due to previous history of drowsiness.  She also has been having some difficulty with sleep due to high-dose steroid, and takes PRN Ambien prescribed by her PCP.  She has not taken it since last Friday.  She denies any fever, chill, night sweats, or weight loss.  She denies any other complaint today.  SUMMARY OF ONCOLOGIC HISTORY: Oncology History  DLBCL (diffuse large B cell lymphoma) ([place])  02/19/2019 Imaging   CT neck: IMPRESSION: 1. 7 cm left lower neck mass extending into the superior mediastinum most concerning for malignancy. This may reflect a conglomerate nodal mass or primary tumor of uncertain origin. 2. Additional enlarged lymph nodes adjacent to the mass  in the left supraclavicular/retroclavicular and left subpectoral regions. 3.  Aortic Atherosclerosis (ICD10-I70.0).   03/12/2019 Procedure   Incisional left cervical LN bx by Dr. Constance Holster   03/12/2019 Pathology Results   Accession:  [id number] Soft tissue mass, simple excision, Left Neck - DIFFUSE LARGE B-CELL LYMPHOMA - SEE COMMENT   03/19/2019 Initial Diagnosis   DLBCL (diffuse large B cell lymphoma) ([place])   03/29/2019 Imaging   PET: IMPRESSION: 1. The large left supraclavicular mass is intensely hypermetabolic compatible with history of lymphoma. There are also nearby FDG avid left supraclavicular, left axillary, and left retroperitoneal lymph nodes. 2. Hypermetabolic retroperitoneal, mesenteric and iliac adenopathy. 3. Aortic Atherosclerosis (ICD10-I70.0). Coronary artery calcifications.   04/04/2019 -  Chemotherapy   The patient had DOXOrubicin (ADRIAMYCIN) chemo injection 80 mg, 50 mg/m2 = 80 mg, Intravenous,  Once, 1 of 6 cycles Administration: 80 mg (04/04/2019) palonosetron (ALOXI) injection 0.25 mg, 0.25 mg, Intravenous,  Once, 1 of 6 cycles Administration: 0.25 mg (04/04/2019) pegfilgrastim-jmdb (FULPHILA) injection 6 mg, 6 mg, Subcutaneous,  Once, 1 of 6 cycles Administration: 6 mg (04/06/2019) vinCRIStine (ONCOVIN) 2 mg in sodium chloride 0.9 % 50 mL chemo infusion, 2 mg, Intravenous,  Once, 1 of 6 cycles Administration: 2 mg (04/04/2019) riTUXimab (RITUXAN) 600 mg in sodium chloride 0.9 % 250 mL (1.9355 mg/mL) infusion, 375 mg/m2 = 600 mg, Intravenous,  Once, 1 of 1 cycle Administration: 600 mg (04/04/2019) cyclophosphamide (CYTOXAN) 1,180 mg in sodium chloride 0.9 % 250 mL chemo infusion, 750 mg/m2 = 1,180 mg, Intravenous,  Once, 1 of 6 cycles Administration: 1,180 mg (04/04/2019) riTUXimab (RITUXAN) 600 mg in sodium chloride 0.9 % 190 mL infusion, 375 mg/m2 = 600 mg, Intravenous,  Once, 0 of 5 cycles fosaprepitant (EMEND) 150 mg, dexamethasone (DECADRON) 12 mg in sodium chloride 0.9 % 145 mL IVPB, , Intravenous,  Once, 1 of 6 cycles Administration:  (04/04/2019)  for chemotherapy treatment.      REVIEW OF SYSTEMS:   Constitutional: ( - ) fevers, ( - )  chills , ( - ) night sweats Eyes: ( - )  blurriness of vision, ( - ) double vision, ( - ) watery eyes Ears, nose, mouth, throat, and face: ( - ) mucositis, ( - ) sore throat Respiratory: ( - ) cough, ( - ) dyspnea, ( - ) wheezes Cardiovascular: ( - ) palpitation, ( - ) chest discomfort, ( - ) lower extremity swelling Gastrointestinal:  ( - ) nausea, ( - ) heartburn, ( - ) change in bowel habits Skin: ( - ) abnormal skin rashes Lymphatics: ( - ) new lymphadenopathy, ( - ) easy bruising Neurological: ( - ) numbness, ( - ) tingling, ( - ) new weaknesses Behavioral/Psych: ( - ) mood change, ( - ) new changes  All other systems were reviewed with the patient and are negative.  I have reviewed the past medical history, past surgical history, social history and family history with the patient and they are unchanged from previous note.  ALLERGIES:  is allergic to codeine and sulfa antibiotics.  MEDICATIONS:  Current Outpatient Medications  Medication Sig Dispense Refill  . acetaminophen (TYLENOL) 325 MG tablet Take 650 mg by mouth every 6 (six) hours as needed.    [name] Kitchen allopurinol (ZYLOPRIM) 300 MG tablet Take 1 tablet (300 mg total) by mouth daily. 30 tablet 3  . Ascorbic Acid (VITAMIN C) 100 MG tablet Take 100 mg by mouth daily.    . Celecoxib (CELEBREX PO) Take by mouth.    [name] Kitchen  HYDROcodone-acetaminophen (NORCO) 7.5-325 MG tablet Take 1 tablet by mouth every 6 (six) hours as needed for moderate pain. 120 tablet 0  . lidocaine-prilocaine (EMLA) cream Apply to affected area once (Patient not taking: Reported on 04/04/2019) 30 g 3  . Multiple Vitamin (MULTIVITAMIN) tablet Take 1 tablet by mouth daily.    . ondansetron (ZOFRAN) 8 MG tablet Take 1 tablet (8 mg total) by mouth 2 (two) times daily as needed for refractory nausea / vomiting. Start on day 3 after cyclophosphamide chemotherapy. (Patient not taking: Reported on 04/04/2019) 30 tablet 1  . pegfilgrastim (NEULASTA ONPRO KIT) 6 MG/0.6ML injection Inject 6 mg into the skin once. Inject via  provided programmed delivery device.    . predniSONE (DELTASONE) 20 MG tablet Take 20 mg by mouth daily. Take 5 tablets (145m) by mouth daily for 5 days. Take on days 1-5 of chemotherapy    . prochlorperazine (COMPAZINE) 10 MG tablet Take 1 tablet (10 mg total) by mouth every 6 (six) hours as needed (Nausea or vomiting). (Patient not taking: Reported on 04/04/2019) 30 tablet 6  . traMADol (ULTRAM) 50 MG tablet Take by mouth every 6 (six) hours as needed.    . vitamin B-12 (CYANOCOBALAMIN) 100 MCG tablet Take 100 mcg by mouth daily.    .[name] Kitchenzolpidem (AMBIEN) 5 MG tablet Take 5 mg by mouth at bedtime as needed for sleep.     No current facility-administered medications for this visit.     PHYSICAL EXAMINATION: ECOG PERFORMANCE STATUS: 1 - Symptomatic but completely ambulatory  There were no vitals filed for this visit. There is no height or weight on file to calculate BMI.  There were no vitals filed for this visit.  GENERAL: alert, no distress and comfortable SKIN: skin color, texture, turgor are normal, no rashes or significant lesions EYES: conjunctiva are pink and non-injected, sclera clear OROPHARYNX: no exudate, no erythema; lips, buccal mucosa, and tongue normal  NECK: supple, non-tender LYMPH:  Bulky left supraclavicular adenopathy, improving, measuring ~4x4cm LUNGS: clear to auscultation with normal breathing effort HEART: regular rate & rhythm and no murmurs and no lower extremity edema ABDOMEN: soft, non-tender, non-distended, normal bowel sounds Musculoskeletal: no cyanosis of digits and no clubbing  PSYCH: alert & oriented x 3, fluent speech NEURO: no focal motor/sensory deficits  LABORATORY DATA:  I have reviewed the data as listed    Component Value Date/Time   NA 140 04/03/2019 0912   NA 137 01/16/2015   K 3.9 04/03/2019 0912   CL 106 04/03/2019 0912   CO2 26 04/03/2019 0912   GLUCOSE 90 04/03/2019 0912   BUN 13 04/03/2019 0912   BUN 12 01/16/2015   CREATININE  0.70 04/03/2019 0912   CALCIUM 9.4 04/03/2019 0912   PROT 7.7 04/03/2019 0912   ALBUMIN 4.0 04/03/2019 0912   AST 22 04/03/2019 0912   ALT 15 04/03/2019 0912   ALKPHOS 70 04/03/2019 0912   BILITOT 0.5 04/03/2019 0912   GFRNONAA >60 04/03/2019 0912   GFRAA >60 04/03/2019 0912    No results found for: SPEP, UPEP  Lab Results  Component Value Date   WBC 5.0 04/03/2019   NEUTROABS 2.9 04/03/2019   HGB 12.5 04/03/2019   HCT 39.5 04/03/2019   MCV 82.0 04/03/2019   PLT 351 04/03/2019      Chemistry      Component Value Date/Time   NA 140 04/03/2019 0912   NA 137 01/16/2015   K 3.9 04/03/2019 0912   CL 106 04/03/2019  0912   CO2 26 04/03/2019 0912   BUN 13 04/03/2019 0912   BUN 12 01/16/2015   CREATININE 0.70 04/03/2019 0912   GLU 96 01/16/2015      Component Value Date/Time   CALCIUM 9.4 04/03/2019 0912   ALKPHOS 70 04/03/2019 0912   AST 22 04/03/2019 0912   ALT 15 04/03/2019 0912   BILITOT 0.5 04/03/2019 0912       RADIOGRAPHIC STUDIES: I have personally reviewed the radiological images as listed below and agreed with the findings in the report. Nm Pet Image Initial (pi) Skull Base To Thigh  Result Date: 03/29/2019 CLINICAL DATA:  Initial treatment strategy for lymphoma. EXAM: NUCLEAR MEDICINE PET SKULL BASE TO THIGH TECHNIQUE: 6.39 mCi F-18 FDG was injected intravenously. Full-ring PET imaging was performed from the skull base to thigh after the radiotracer. CT data was obtained and used for attenuation correction and anatomic localization. Fasting blood glucose: 89 mg/dl COMPARISON:  None FINDINGS: Mediastinal blood pool activity: SUV max 2.98 Liver activity: SUV max 4.36 NECK: Large hypermetabolic mass within the left supraclavicular region is identified measuring 7.4 cm within SUV max 29.2. This extends into the superior mediastinum within the left prevascular region and has mass effect upon the great vessels and trachea. Hypermetabolic lateral supraclavicular region  lymph node measures 1.6 cm with SUV max of 23.79. Incidental CT findings: none CHEST: Left retropectoral FDG avid lymph nodes are identified. The largest measures 1.5 cm with SUV max of 30.42. Left lateral chest wall versus inferior left axillary lymph node measures 1.7 x 0.6 cm within SUV max of 2.8. No hypermetabolic mediastinal or hilar lymph nodes identified. No suspicious FDG avid pulmonary nodules identified. Small subpleural nodule within the posterior left lower lobe measures 4 mm. Nearby central left lower lobe lung nodule measures 3 mm. These are both too small to characterize by PET-CT. Incidental CT findings: Aortic atherosclerosis. Coronary artery calcifications. ABDOMEN/PELVIS: No FDG uptake identified within the liver, pancreas, or spleen. Normal adrenal glands. Hypermetabolic abdominal and pelvic lymph nodes:. Left retroperitoneal lymph node measures 1.1 cm and has SUV max of 6.97. Right retrocaval lymph node has a SUV max of 7.3. FDG avid aortocaval nodes are noted with SUV max of 5.8. Within the low right mesentery there is a 1.8 cm lymph node within SUV max of 22.3. At the aortic bifurcation there is a left common iliac node measuring 1.1 cm with SUV max of 7.1. Right internal iliac lymph node demonstrates mild uptake within SUV max of 4.48. Incidental CT findings: Aortic atherosclerosis without aneurysm. Colonic diverticulosis. No ascites. SKELETON: No focal hypermetabolic activity to suggest skeletal metastasis. Incidental CT findings: none IMPRESSION: 1. The large left supraclavicular mass is intensely hypermetabolic compatible with history of lymphoma. There are also nearby FDG avid left supraclavicular, left axillary, and left retroperitoneal lymph nodes. 2. Hypermetabolic retroperitoneal, mesenteric and iliac adenopathy. 3. Aortic Atherosclerosis (ICD10-I70.0). Coronary artery calcifications. Electronically Signed   By: Kerby Moors M.D.   On: 03/29/2019 13:36   Mm 3d Screen Breast  Bilateral  Result Date: 04/19/2019 CLINICAL DATA:  Screening. EXAM: DIGITAL SCREENING BILATERAL MAMMOGRAM WITH TOMO AND CAD COMPARISON:  Previous exam(s). ACR Breast Density Category c: The breast tissue is heterogeneously dense, which may obscure small masses. FINDINGS: There are no findings suspicious for malignancy. Images were processed with CAD. IMPRESSION: No mammographic evidence of malignancy. A result letter of this screening mammogram will be mailed directly to the patient. RECOMMENDATION: Screening mammogram in one year. (Code:[id number]) BI-RADS CATEGORY  1: Negative. Electronically Signed   By: Claudie Revering M.D.   On: 04/19/2019 16:51   Ir Imaging Guided Port Insertion  Result Date: 03/28/2019 CLINICAL DATA:  Newly diagnosed large B-cell lymphoma EXAM: TUNNELED PORT CATHETER PLACEMENT WITH ULTRASOUND AND FLUOROSCOPIC GUIDANCE FLUOROSCOPY TIME:  0.1 minute; 11 uGym2 DAP ANESTHESIA/SEDATION: Intravenous Fentanyl 138mg and Versed 279mwere administered as conscious sedation during continuous monitoring of the patient's level of consciousness and physiological / cardiorespiratory status by the radiology RN, with a total moderate sedation time of 14 minutes. TECHNIQUE: The procedure, risks, benefits, and alternatives were explained to the patient. Questions regarding the procedure were encouraged and answered. The patient understands and consents to the procedure. As antibiotic prophylaxis, cefazolin 2 g was ordered pre-procedure and administered intravenously within one hour of incision. Patency of the right IJ vein was confirmed with ultrasound with image documentation. An appropriate skin site was determined. Skin site was marked. Region was prepped using maximum barrier technique including cap and mask, sterile gown, sterile gloves, large sterile sheet, and Chlorhexidine as cutaneous antisepsis. The region was infiltrated locally with 1% lidocaine. Under real-time ultrasound guidance, the right IJ  vein was accessed with a 21 gauge micropuncture needle; the needle tip within the vein was confirmed with ultrasound image documentation. Needle was exchanged over a 018 guidewire for transitional dilator, and vascular measurement was performed. A small incision was made on the right anterior chest wall and a subcutaneous pocket fashioned. The power-injectable port was positioned and its catheter tunneled to the right IJ dermatotomy site. The transitional dilator was exchanged over an Amplatz wire for a peel-away sheath, through which the port catheter, which had been trimmed to the appropriate length, was advanced and positioned under fluoroscopy with its tip at the cavoatrial junction. Spot chest radiograph confirms good catheter position and no pneumothorax. The port was flushed per protocol. The pocket was closed with deep interrupted and subcuticular continuous 3-0 Monocryl sutures. The incisions were covered with Dermabond then covered with a sterile dressing. The patient tolerated the procedure well. COMPLICATIONS: COMPLICATIONS None immediate IMPRESSION: Technically successful right IJ power-injectable port catheter placement. Ready for routine use. Electronically Signed   By: D Lucrezia Europe.D.   On: 03/28/2019 13:51

## 2019-04-25 NOTE — Progress Notes (Signed)
Nutrition Assessment   Reason for Assessment:   Referral from RN, [place] for lactose free oral nutrition supplements.     ASSESSMENT:  78 year old female with lymphoma followed by Dr. Maylon Peppers.  Patient receiving chemotherapy.     Add on for today. Met with patient in infusion.  Patient reports that she has been trying to eat as much as she can.  "I am still loosing weight."  Dr. Maylon Peppers wants me to start drinking the shakes but I am lactose intolerant.   Nutrition Focused Physical Exam: deferred   Medications: MVI, predisone, compazine Vit b 12  Labs: reviewed   Anthropometrics:   Height: 4'10" Weight: 127 lb 11.2 oz 131 lb 8 oz on 8/16 137 lb on 09/18/2018 BMI: 26  7% weight loss in the last 7 months  Estimated Energy Needs  Kcals: 1700-2000 Protein: 85-100 g Fluid: 2 L   NUTRITION DIAGNOSIS: Unintentional weight loss related to cancer as evidenced by 7% weight loss.     INTERVENTION:  Provided samples of lactose free shakes to patient to try today. Contact information provided.     MONITORING, EVALUATION, GOAL: Patient will increase calories and protein to prevent weight loss   Next Visit: Sept 23rd during infusion  Sandra Payne B. Zenia Resides, [place], [place] Registered Dietitian [phone] (pager)

## 2019-04-26 ENCOUNTER — Telehealth: Payer: Self-pay | Admitting: *Deleted

## 2019-04-26 NOTE — Telephone Encounter (Signed)
Received vmail from pt regarding medication. Returned call to pt, reviewed the following medications with pt, Prednisone,Trazodone and Claritin. Pt stated she only took 1 Prednisone tablet yesterday 04/26/19, instructions are to take 5 tablets for 5 days. Advised pt to start with 5 tablets today and finish course. Trazodone and Claritn were reviewed with pt and only wanted clarifications on those meds. Pt verbalized understanding and advised if there were any other concerns to call

## 2019-04-27 ENCOUNTER — Other Ambulatory Visit: Payer: Self-pay

## 2019-04-27 ENCOUNTER — Inpatient Hospital Stay: Payer: PPO

## 2019-04-27 VITALS — BP 102/78 | HR 78 | Temp 98.2°F | Resp 18

## 2019-04-27 DIAGNOSIS — Z5189 Encounter for other specified aftercare: Secondary | ICD-10-CM | POA: Diagnosis not present

## 2019-04-27 DIAGNOSIS — C8331 Diffuse large B-cell lymphoma, lymph nodes of head, face, and neck: Secondary | ICD-10-CM

## 2019-04-27 MED ORDER — PEGFILGRASTIM-JMDB 6 MG/0.6ML ~~LOC~~ SOSY
6.0000 mg | PREFILLED_SYRINGE | Freq: Once | SUBCUTANEOUS | Status: AC
  Administered 2019-04-27: 10:00:00 6 mg via SUBCUTANEOUS
  Filled 2019-04-27: qty 0.6

## 2019-04-27 NOTE — Patient Instructions (Signed)

## 2019-05-04 ENCOUNTER — Telehealth: Payer: Self-pay | Admitting: Medical

## 2019-05-04 ENCOUNTER — Other Ambulatory Visit: Payer: Self-pay

## 2019-05-04 ENCOUNTER — Inpatient Hospital Stay: Payer: PPO

## 2019-05-04 ENCOUNTER — Other Ambulatory Visit: Payer: Self-pay | Admitting: *Deleted

## 2019-05-04 ENCOUNTER — Inpatient Hospital Stay (HOSPITAL_BASED_OUTPATIENT_CLINIC_OR_DEPARTMENT_OTHER): Payer: PPO | Admitting: Medical

## 2019-05-04 ENCOUNTER — Other Ambulatory Visit: Payer: Self-pay | Admitting: Medical

## 2019-05-04 ENCOUNTER — Ambulatory Visit (HOSPITAL_COMMUNITY)
Admission: RE | Admit: 2019-05-04 | Discharge: 2019-05-04 | Disposition: A | Discharge status: Home / Self Care | Payer: PPO | Source: Ambulatory Visit | Attending: Medical | Admitting: Medical

## 2019-05-04 ENCOUNTER — Telehealth: Payer: Self-pay | Admitting: *Deleted

## 2019-05-04 VITALS — BP 133/77 | HR 109 | Temp 97.3°F | Resp 18 | Ht <= 58 in | Wt 127.6 lb

## 2019-05-04 DIAGNOSIS — R112 Nausea with vomiting, unspecified: Secondary | ICD-10-CM | POA: Diagnosis not present

## 2019-05-04 DIAGNOSIS — K5900 Constipation, unspecified: Secondary | ICD-10-CM | POA: Diagnosis not present

## 2019-05-04 DIAGNOSIS — C8331 Diffuse large B-cell lymphoma, lymph nodes of head, face, and neck: Secondary | ICD-10-CM | POA: Diagnosis not present

## 2019-05-04 DIAGNOSIS — Z5189 Encounter for other specified aftercare: Secondary | ICD-10-CM | POA: Diagnosis not present

## 2019-05-04 DIAGNOSIS — Z95828 Presence of other vascular implants and grafts: Secondary | ICD-10-CM

## 2019-05-04 DIAGNOSIS — R109 Unspecified abdominal pain: Secondary | ICD-10-CM | POA: Diagnosis not present

## 2019-05-04 LAB — CMP (CANCER CENTER ONLY)
ALT: 12 U/L (ref 0–44)
AST: 11 U/L — ABNORMAL LOW (ref 15–41)
Albumin: 3.7 g/dL (ref 3.5–5.0)
Alkaline Phosphatase: 73 U/L (ref 38–126)
Anion gap: 9 (ref 5–15)
BUN: 11 mg/dL (ref 8–23)
CO2: 28 mmol/L (ref 22–32)
Calcium: 9.3 mg/dL (ref 8.9–10.3)
Chloride: 98 mmol/L (ref 98–111)
Creatinine: 0.68 mg/dL (ref 0.44–1.00)
GFR, Est AFR Am: >60 mL/min (ref >60)
GFR, Estimated: >60 mL/min (ref >60)
Glucose, Bld: 147 mg/dL — ABNORMAL HIGH (ref 70–99)
Potassium: 3.9 mmol/L (ref 3.5–5.1)
Sodium: 135 mmol/L (ref 135–145)
Total Bilirubin: 0.3 mg/dL (ref 0.3–1.2)
Total Protein: 6.8 g/dL (ref 6.5–8.1)

## 2019-05-04 LAB — CBC WITH DIFFERENTIAL (CANCER CENTER ONLY)
Abs Immature Granulocytes: 0.01 10*3/uL (ref 0.00–0.07)
Basophils Absolute: 0 10*3/uL (ref 0.0–0.1)
Basophils Relative: 2 %
Eosinophils Absolute: 0.1 10*3/uL (ref 0.0–0.5)
Eosinophils Relative: 7 %
HCT: 36.8 % (ref 36.0–46.0)
Hemoglobin: 11.9 g/dL — ABNORMAL LOW (ref 12.0–15.0)
Immature Granulocytes: 1 %
Lymphocytes Relative: 19 %
Lymphs Abs: 0.3 10*3/uL — ABNORMAL LOW (ref 0.7–4.0)
MCH: 26.3 pg (ref 26.0–34.0)
MCHC: 32.3 g/dL (ref 30.0–36.0)
MCV: 81.2 fL (ref 80.0–100.0)
Monocytes Absolute: 0.3 10*3/uL (ref 0.1–1.0)
Monocytes Relative: 19 %
Neutro Abs: 0.8 10*3/uL — ABNORMAL LOW (ref 1.7–7.7)
Neutrophils Relative %: 52 %
Platelet Count: 126 10*3/uL — ABNORMAL LOW (ref 150–400)
RBC: 4.53 MIL/uL (ref 3.87–5.11)
RDW: 15.5 % (ref 11.5–15.5)
WBC Count: 1.5 10*3/uL — ABNORMAL LOW (ref 4.0–10.5)
nRBC: 0 % (ref 0.0–0.2)

## 2019-05-04 MED ORDER — SODIUM CHLORIDE 0.9% FLUSH
10.0000 mL | INTRAVENOUS | Status: DC | PRN
  Administered 2019-05-04: 10 mL via INTRAVENOUS
  Filled 2019-05-04: qty 10

## 2019-05-04 MED ORDER — SODIUM CHLORIDE 0.9% FLUSH
10.0000 mL | Freq: Once | INTRAVENOUS | Status: AC
  Administered 2019-05-04: 10 mL via INTRAVENOUS
  Filled 2019-05-04: qty 10

## 2019-05-04 MED ORDER — ONDANSETRON HCL 4 MG/2ML IJ SOLN
INTRAMUSCULAR | Status: AC
  Filled 2019-05-04: qty 4

## 2019-05-04 MED ORDER — SODIUM CHLORIDE 0.9 % IV SOLN
Freq: Once | INTRAVENOUS | Status: AC
  Administered 2019-05-04: 12:00:00 via INTRAVENOUS
  Filled 2019-05-04: qty 250

## 2019-05-04 MED ORDER — ONDANSETRON 4 MG PO TBDP: 4.0000 mg | ORAL_TABLET | Freq: Three times a day (TID) | ORAL | 3 refills | Status: DC | PRN

## 2019-05-04 MED ORDER — ONDANSETRON HCL 4 MG/2ML IJ SOLN
8.0000 mg | Freq: Once | INTRAMUSCULAR | Status: AC
  Administered 2019-05-04: 12:00:00 8 mg via INTRAVENOUS

## 2019-05-04 MED ORDER — DEXAMETHASONE SODIUM PHOSPHATE 10 MG/ML IJ SOLN
10.0000 mg | Freq: Once | INTRAMUSCULAR | Status: AC
  Administered 2019-05-04: 10 mg via INTRAVENOUS

## 2019-05-04 MED ORDER — HEPARIN SOD (PORK) LOCK FLUSH 100 UNIT/ML IV SOLN
500.0000 [IU] | Freq: Once | INTRAVENOUS | Status: AC
  Administered 2019-05-04: 13:00:00 500 [IU] via INTRAVENOUS
  Filled 2019-05-04: qty 5

## 2019-05-04 MED ORDER — SODIUM CHLORIDE 0.9 % IV SOLN: 10.0000 mg | Freq: Once | INTRAVENOUS | Status: DC

## 2019-05-04 MED ORDER — DEXAMETHASONE SODIUM PHOSPHATE 10 MG/ML IJ SOLN
INTRAMUSCULAR | Status: AC
  Filled 2019-05-04: qty 1

## 2019-05-04 NOTE — Progress Notes (Signed)
[facility] Progress Note   PERLEAN FLUD [id number] 08-08-1941 78 y.o.  Sandra Payne is managed by Dr. Tish Men   Actively treated with chemotherapy/immunotherapy/hormonal therapy: yes  Current therapy: R-CHOP with G-CSF support   Last treated: 04/25/2019 (cycle #2)  Next scheduled appointment with provider: 05/16/2019  Assessment: Plan:    Diffuse large B-cell lymphoma of lymph nodes of neck ([facility])  Constipation, unspecified constipation type   Diffuse large B-cell lymphoma: Ms. Bonde is followed by Dr. Tish Men and is status post cycle 2 of R-CHOP with G-CSF support which was dosed on 04/25/2019.  She is scheduled to return for her next treatment on 05/16/2019.   Constipation: The patient was referred for a KUB today which returned showing:  Scattered stool and gas throughout colon.  Nonobstructive bowel gas pattern.  No bowel wall thickening, bowel dilatation or free air.  Numerous pelvic phleboliths.  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  Senna-S, 1 to 2 tablets twice daily as needed  MiraLAX 17 grams in 8 ounces of liquids 1 to 2 times daily as needed  Remember to remain well hydrated. Drink, Drink, Drink non-caffeinated beverages.    Nausea: The patient was given a prescription for Zofran ODT 4 mg every 8 hours PRN.   Insomnia: The patient was instructed that she can increase her trazodone from 50 mg p.o. nightly to 75 mg p.o. nightly as needed for insomnia.   Please see After Visit Summary for patient specific instructions.  Future Appointments  Date Time Provider [place]  05/04/2019 10:15 AM [facility] None  05/04/2019 10:30 AM Tanner, Lucianne Lei E., PA-C [facility] None  05/04/2019 11:15 AM [facility] 6 [facility] [place]  05/16/2019  8:00 AM [facility] LAB 1 [facility] None  05/16/2019  8:15 AM [facility] [place] FLUSH [facility] None  05/16/2019  8:30 AM Tish Men, MD [facility]  None  05/16/2019  9:30 AM [facility] INFUSION [facility] None  05/18/2019  9:00 AM [facility] [place] FLUSH [facility] None  06/06/2019 10:00 AM [facility] LAB ONLY [facility] None  06/06/2019 10:15 AM [facility] [place] FLUSH [facility] None  06/06/2019 10:30 AM Tish Men, MD [facility] None  06/06/2019 11:30 AM [facility] INFUSION [facility] None  06/08/2019  9:00 AM [facility] [place] FLUSH [facility] None    No orders of the defined types were placed in this encounter.      Subjective:   Patient ID:  Sandra Payne is a 77 y.o. (DOB [birth year]) female.  Chief Complaint: No chief complaint on file.   HPI Sandra Payne Is a 78 y.o. 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 An incision LN bx showed DLBCL. A staging PET showed bulky left supraclavicular as well as left axillary, RP, mesenteric and iliac adenopathy. Her bone marrow bx was negative for lymphoma. She was begun on R-CHOP with G-CSF support and was last treated with cycle #2 on 04/25/2019. Ms. Pralle called our office this morning reporting:   Ongoing achy stomach discomfort, "hurting all the time", 7/10, not taking tramadol as prescribed b/c concerned about interaction with Ambien.    Last BM on Tuesday (normal), prior BM 5 days prior.  She is drinking mag citrate when constipated.  Ongoing nausea not resolved with compazine which she is taking q6h.  Drinking about 2 Ensure daily, 4-5 16 oz bottles of  water daily.  Able to eat small amounts of some foods (e.g shrimp and grits, rice w/ gravy, boiled/fried eggs).  New onset of R arm "jerking" intermittently with occasional cramping.  She reports that having a bowel movement around every 5 days is not unusual for her.  She had nausea and vomiting yesterday.  She also reports that she possibly has fevers chills and sweats but is  not completely sure.  She has not taken her temperature.  She requests something to drink and tomato soup while she was here today.  She was able to completely finish the tomato soup and several packs of crackers.   Medications: I have reviewed the patient's current medications.  Allergies:  Allergies  Allergen Reactions  . Codeine Itching  . Sulfa Antibiotics Other (See Comments)    Childhood allergy    Past Medical History:  Diagnosis Date  . Anemia   . Arthralgia   . Arthritis   . Elevated cholesterol   . Insomnia   . Lymphoma ([place])   . Neck mass    left    Past Surgical History:  Procedure Laterality Date  . ABDOMINAL HYSTERECTOMY     partial  . IR IMAGING GUIDED PORT INSERTION  03/28/2019  . MASS BIOPSY Left 03/12/2019   Procedure: NECK MASS BIOPSY;  Surgeon: Izora Gala, MD;  Location: [facility];  Service: ENT;  Laterality: Left;  . 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 Diabetes Mother   . Hypertension Mother   . Breast cancer Neg Hx     Social History   Socioeconomic History  . Marital status: Widowed    Spouse name: Not on file  . Number of children: 1  . Years of education: Not on file  . Highest education level: Not on file  Occupational History  . Not on file  Social Needs  . Financial resource strain: Not on file  . Food insecurity    Worry: Not on file    Inability: Not on file  . Transportation needs    Medical: Not on file    Non-medical: Not on file  Tobacco Use  . Smoking status: Former Smoker    Packs/day: 0.25    Years: 2.00    Pack years: 0.50    Types: Cigarettes  . Smokeless tobacco: Never Used  Substance and Sexual Activity  . Alcohol use: Yes    Alcohol/week: 0.0 standard drinks    Comment: occasionally  . Drug use: No  .  Sexual activity: Not on file  Lifestyle  . Physical activity    Days per week: Not on file    Minutes per session: Not on file  . Stress: Not on file  Relationships  . Social Herbalist on phone: Not on file    Gets together: Not on file    Attends religious service: Not on file    Active member of club or organization: Not on file    Attends meetings of clubs or organizations: Not on file    Relationship status: Not on file  . Intimate partner violence    Fear of current or ex partner: Not on file    Emotionally abused: Not on file    Physically abused: Not on file    Forced  sexual activity: Not on file  Other Topics Concern  . Not on file  Social History Narrative   Lives alone in a 2 story home.  Husband passed away on 09/15/14.     Works one day a week at [employer].     Retired from [employer].  Exercises regularly.    Patient is right-handed.    Past Medical History, Surgical history, Social history, and Family history were reviewed and updated as appropriate.   Please see review of systems for further details on the patient's review from today.   Review of Systems:  Review of Systems  Constitutional: Negative for appetite change, chills, diaphoresis, fever and unexpected weight change.  HENT: Negative for trouble swallowing.   Respiratory: Negative for cough, choking and shortness of breath.   Cardiovascular: Negative for chest pain and palpitations.  Gastrointestinal: Positive for abdominal pain, constipation and nausea. Negative for abdominal distention, anal bleeding, blood in stool, diarrhea, rectal pain and vomiting.  Musculoskeletal:       New onset of R arm "jerking" intermittently with occasional cramping.      Objective:   Physical Exam:  There were no vitals taken for this visit. ECOG: 1  Physical Exam Constitutional:      General: She is not in acute distress.    Appearance: She is not  diaphoretic.     Comments: The patient is ambulating with the use of a wheelchair.  HENT:     Head: Normocephalic and atraumatic.  Cardiovascular:     Rate and Rhythm: Normal rate and regular rhythm.     Heart sounds: Normal heart sounds. No murmur. No friction rub. No gallop.   Pulmonary:     Effort: Pulmonary effort is normal. No respiratory distress.     Breath sounds: Normal breath sounds. No wheezing or rales.  Abdominal:     General: Bowel sounds are normal. There is no distension.     Palpations: Abdomen is soft. There is no mass.     Tenderness: There is no abdominal tenderness. There is no guarding or rebound.  Skin:    General: Skin is warm and dry.  Neurological:     Mental Status: She is alert.  Psychiatric:        Mood and Affect: Mood normal.        Behavior: Behavior normal.     Lab Review:     Component Value Date/Time   NA 141 04/25/2019 1055   NA 137 01/16/2015   K 4.1 04/25/2019 1055   CL 105 04/25/2019 1055   CO2 26 04/25/2019 1055   GLUCOSE 96 04/25/2019 1055   BUN 10 04/25/2019 1055   BUN 12 01/16/2015   CREATININE 0.73 04/25/2019 1055   CALCIUM 9.4 04/25/2019 1055   PROT 6.8 04/25/2019 1055   ALBUMIN 3.6 04/25/2019 1055   AST 18 04/25/2019 1055   ALT 16 04/25/2019 1055   ALKPHOS 68 04/25/2019 1055   BILITOT <0.2 (L) 04/25/2019 1055   GFRNONAA >60 04/25/2019 1055   GFRAA >60 04/25/2019 1055       Component Value Date/Time   WBC 5.1 04/25/2019 1055   WBC 6.6 03/28/2019 1111   RBC 4.65 04/25/2019 1055   HGB 12.2 04/25/2019 1055   HCT 38.8 04/25/2019 1055   PLT 456 (H) 04/25/2019 1055   MCV 83.4 04/25/2019 1055   MCH 26.2 04/25/2019 1055   MCHC 31.4 04/25/2019 1055   RDW 16.6 (H) 04/25/2019 1055  LYMPHSABS 0.7 04/25/2019 1055   MONOABS 0.9 04/25/2019 1055   EOSABS 0.1 04/25/2019 1055   BASOSABS 0.0 04/25/2019 1055   -------------------------------  Imaging from last 24 hours (if applicable):  Radiology interpretation: Mm 3d  Screen Breast Bilateral  Result Date: 04/19/2019 CLINICAL DATA:  Screening. EXAM: DIGITAL SCREENING BILATERAL MAMMOGRAM WITH TOMO AND CAD COMPARISON:  Previous exam(s). ACR Breast Density Category c: The breast tissue is heterogeneously dense, which may obscure small masses. FINDINGS: There are no findings suspicious for malignancy. Images were processed with CAD. IMPRESSION: No mammographic evidence of malignancy. A result letter of this screening mammogram will be mailed directly to the patient. RECOMMENDATION: Screening mammogram in one year. (Code:SM-B-01Y) BI-RADS CATEGORY  1: Negative. Electronically Signed   By: Claudie Revering M.D.   On: 04/19/2019 16:51

## 2019-05-04 NOTE — Telephone Encounter (Signed)
Thank you.  Dr. Brittnay Pigman  

## 2019-05-04 NOTE — Telephone Encounter (Addendum)
TCT patient after receiving vm message from [name], RN  Spoke with patient. Advised that we can see her in the [facility] this morning.  Asked her to come in @ 10 am for labs and then she will see Sandi Mealy, PA in [facility] after that. Pt stated she could come in at that time.  [name], RN made aware of the above.

## 2019-05-04 NOTE — Telephone Encounter (Signed)
No los per 9/11.

## 2019-05-04 NOTE — Telephone Encounter (Signed)
Lucianne Lei, would you be able to take a look at her? I wonder if she is just very constipated. Okay with any bowel regimen except enema. If necessary, okay to do a abdominal X-ray.  Thanks.  Dr. Maylon Peppers

## 2019-05-04 NOTE — Patient Instructions (Addendum)
Dehydration, Adult  Dehydration is a condition in which there is not enough fluid or water in the body. This happens when you lose more fluids than you take in. Important organs, such as the kidneys, brain, and heart, cannot function without a proper amount of fluids. Any loss of fluids from the body can lead to dehydration. Dehydration can range from mild to severe. This condition should be treated right away to prevent it from becoming severe. What are the causes? This condition may be caused by:  Vomiting.  Diarrhea.  Excessive sweating, such as from heat exposure or exercise.  Not drinking enough fluid, especially: ? When ill. ? While doing activity that requires a lot of energy.  Excessive urination.  Fever.  Infection.  Certain medicines, such as medicines that cause the body to lose excess fluid (diuretics).  Inability to access safe drinking water.  Reduced physical ability to get adequate water and food. What increases the risk? This condition is more likely to develop in people:  Who have a poorly controlled long-term (chronic) illness, such as diabetes, heart disease, or kidney disease.  Who are age 65 or older.  Who are disabled.  Who live in a place with high altitude.  Who play endurance sports. What are the signs or symptoms? Symptoms of mild dehydration may include:  Thirst.  Dry lips.  Slightly dry mouth.  Dry, warm skin.  Dizziness. Symptoms of moderate dehydration may include:  Very dry mouth.  Muscle cramps.  Dark urine. Urine may be the color of tea.  Decreased urine production.  Decreased tear production.  Heartbeat that is irregular or faster than normal (palpitations).  Headache.  Light-headedness, especially when you stand up from a sitting position.  Fainting (syncope). Symptoms of severe dehydration may include:  Changes in skin, such as: ? Cold and clammy skin. ? Blotchy (mottled) or pale skin. ? Skin that does  not quickly return to normal after being lightly pinched and released (poor skin turgor).  Changes in body fluids, such as: ? Extreme thirst. ? No tear production. ? Inability to sweat when body temperature is high, such as in hot weather. ? Very little urine production.  Changes in vital signs, such as: ? Weak pulse. ? Pulse that is more than 100 beats a minute when sitting still. ? Rapid breathing. ? Low blood pressure.  Other changes, such as: ? Sunken eyes. ? Cold hands and feet. ? Confusion. ? Lack of energy (lethargy). ? Difficulty waking up from sleep. ? Short-term weight loss. ? Unconsciousness. How is this diagnosed? This condition is diagnosed based on your symptoms and a physical exam. Blood and urine tests may be done to help confirm the diagnosis. How is this treated? Treatment for this condition depends on the severity. Mild or moderate dehydration can often be treated at home. Treatment should be started right away. Do not wait until dehydration becomes severe. Severe dehydration is an emergency and it needs to be treated in a hospital. Treatment for mild dehydration may include:  Drinking more fluids.  Replacing salts and minerals in your blood (electrolytes) that you may have lost. Treatment for moderate dehydration may include:  Drinking an oral rehydration solution (ORS). This is a drink that helps you replace fluids and electrolytes (rehydrate). It can be found at pharmacies and retail stores. Treatment for severe dehydration may include:  Receiving fluids through an IV tube.  Receiving an electrolyte solution through a feeding tube that is passed through your nose and   into your stomach (nasogastric tube, or NG tube).  Correcting any abnormalities in electrolytes.  Treating the underlying cause of dehydration. Follow these instructions at home:  If directed by your health care provider, drink an ORS: ? Make an ORS by following instructions on the  package. ? Start by drinking small amounts, about  cup (120 mL) every 5-10 minutes. ? Slowly increase how much you drink until you have taken the amount recommended by your health care provider.  Drink enough clear fluid to keep your urine clear or pale yellow. If you were told to drink an ORS, finish the ORS first, then start slowly drinking other clear fluids. Drink fluids such as: ? Water. Do not drink only water. Doing that can lead to having too little salt (sodium) in the body (hyponatremia). ? Ice chips. ? Fruit juice that you have added water to (diluted fruit juice). ? Low-calorie sports drinks.  Avoid: ? Alcohol. ? Drinks that contain a lot of sugar. These include high-calorie sports drinks, fruit juice that is not diluted, and soda. ? Caffeine. ? Foods that are greasy or contain a lot of fat or sugar.  Take over-the-counter and prescription medicines only as told by your health care provider.  Do not take sodium tablets. This can lead to having too much sodium in the body (hypernatremia).  Eat foods that contain a healthy balance of electrolytes, such as bananas, oranges, potatoes, tomatoes, and spinach.  Keep all follow-up visits as told by your health care provider. This is important. Contact a health care provider if:  You have abdominal pain that: ? Gets worse. ? Stays in one area (localizes).  You have a rash.  You have a stiff neck.  You are more irritable than usual.  You are sleepier or more difficult to wake up than usual.  You feel weak or dizzy.  You feel very thirsty.  You have urinated only a small amount of very dark urine over 6-8 hours. Get help right away if:  You have symptoms of severe dehydration.  You cannot drink fluids without vomiting.  Your symptoms get worse with treatment.  You have a fever.  You have a severe headache.  You have vomiting or diarrhea that: ? Gets worse. ? Does not go away.  You have blood or green matter  (bile) in your vomit.  You have blood in your stool. This may cause stool to look black and tarry.  You have not urinated in 6-8 hours.  You faint.  Your heart rate while sitting still is over 100 beats a minute.  You have trouble breathing. This information is not intended to replace advice given to you by your health care provider. Make sure you discuss any questions you have with your health care provider. Document Released: 08/09/2005 Document Revised: 07/22/2017 Document Reviewed: 10/03/2015 Elsevier Patient Education  2020 [place].     Abdominal Pain, Adult Abdominal pain can be caused by many things. Often, abdominal pain is not serious and it gets better with no treatment or by being treated at home. However, sometimes abdominal pain is serious. Your health care provider will do a medical history and a physical exam to try to determine the cause of your abdominal pain. Follow these instructions at home:  Take over-the-counter and prescription medicines only as told by your health care provider. Do not take a laxative unless told by your health care provider.  Drink enough fluid to keep your urine clear or pale yellow.  Watch your condition for any changes.  Keep all follow-up visits as told by your health care provider. This is important. Contact a health care provider if:  Your abdominal pain changes or gets worse.  You are not hungry or you lose weight without trying.  You are constipated or have diarrhea for more than 2-3 days.  You have pain when you urinate or have a bowel movement.  Your abdominal pain wakes you up at night.  Your pain gets worse with meals, after eating, or with certain foods.  You are throwing up and cannot keep anything down.  You have a fever. Get help right away if:  Your pain does not go away as soon as your health care provider told you to expect.  You cannot stop throwing up.  Your pain is only in areas of the abdomen,  such as the right side or the left lower portion of the abdomen.  You have bloody or black stools, or stools that look like tar.  You have severe pain, cramping, or bloating in your abdomen.  You have signs of dehydration, such as: ? Dark urine, very little urine, or no urine. ? Cracked lips. ? Dry mouth. ? Sunken eyes. ? Sleepiness. ? Weakness. This information is not intended to replace advice given to you by your health care provider. Make sure you discuss any questions you have with your health care provider. Document Released: 05/19/2005 Document Revised: 02/27/2016 Document Reviewed: 01/21/2016 Elsevier Interactive Patient Education  [place].           Constipation Management  Magnesium Citrate, drink 1/2 bottle, drink remainder if no bowel movement with 30 to 60 minutes  Or  30 mg (1 tablespoon) of Milk of Magnesia in 8 ounces of prune juice, warm in microwave for 20 seconds    Begin the following after you have had a bowel movement:  Senna-S, 1 to 2 tablets twice daily  MiraLAX 17 grams in 8 ounces of liquids 1 to 2 times daily as needed   Remember to remain well hydrated. Drink, Drink, Drink non-caffeinated beverages.   Adjust these medications based on your response. If your bowel movements become too loose then decrease the amount of Senna-S and/or MiraLAX that you are using. If your bowel movements become too firm or are difficult to pass, the increase the amount of Senna-S and/or MiraLAX that you are using and increase your intake of water.

## 2019-05-04 NOTE — Patient Instructions (Signed)

## 2019-05-04 NOTE — Telephone Encounter (Signed)
Oncology Nurse Navigator Documentation  Rec'd call from Ms. Sandra Payne.  She reported:  Ongoing achy stomach discomfort, "hurting all the time", 7/10, not taking tramadol as prescribed b/c concerned about interaction with Ambien.    Last BM on Tuesday (normal), prior BM 5 days prior.  She is drinking mag citrate when constipated.  Ongoing nausea not resolved with compazine which she is taking q6h.  Drinking about 2 Ensure daily, 4-5 16 oz bottles of water daily.  Able to eat small amounts of some foods (e.g shrimp and grits, rice w/ gravy, boiled/fried eggs).  New onset of R arm "jerking" intermittently with occasional cramping. I indicated I wd inform Dr. Maylon Payne and Symptom Mgt, call her with further guidance.  She agreed to arrange transportation if clinic arrival suggested.  [name], RN, BSN Head & Neck Oncology Nurse [place] at [place] [phone]

## 2019-05-11 ENCOUNTER — Telehealth: Payer: Self-pay | Admitting: *Deleted

## 2019-05-11 NOTE — Telephone Encounter (Signed)
Oncology Nurse Navigator Documentation  Rec'd call from Ms. Sagar.  She asked if OK to get flu shot.  I noted his guidance is flu vaccination OK if pt does not have active infection.  She denied recent fevers/chills, indicated she takes temp about twice weekly.  I suggested she ask Dr. Maylon Peppers next Wed when she sees him and he has current labs available.  Also suggested she take temp same time daily 30 min before/after oral intake.  She reported (excitedly!): - Slept through the HS last night for the first time in several days, attributed this to "not waking up hungry".  She stated she has been taking antiemetics somewhat regularly, has been able to consistently eat w/o nausea/emesis. - Her son has moved from [place], is now back in [place], [place] with his family, is able to see her regularly. She understands she can call me as needed or with further updates.  [name], RN, BSN Head & Neck Oncology Nurse [place] at [place] [phone]

## 2019-05-16 ENCOUNTER — Inpatient Hospital Stay: Payer: PPO

## 2019-05-16 ENCOUNTER — Inpatient Hospital Stay (HOSPITAL_BASED_OUTPATIENT_CLINIC_OR_DEPARTMENT_OTHER): Payer: PPO | Admitting: Hematology

## 2019-05-16 ENCOUNTER — Ambulatory Visit: Payer: PPO

## 2019-05-16 ENCOUNTER — Encounter: Payer: Self-pay | Admitting: Hematology

## 2019-05-16 ENCOUNTER — Other Ambulatory Visit: Payer: Self-pay

## 2019-05-16 VITALS — BP 135/73 | HR 102 | Temp 98.2°F | Resp 18 | Ht <= 58 in | Wt 130.1 lb

## 2019-05-16 VITALS — BP 125/73 | HR 100 | Resp 17

## 2019-05-16 DIAGNOSIS — Z79899 Other long term (current) drug therapy: Secondary | ICD-10-CM | POA: Diagnosis not present

## 2019-05-16 DIAGNOSIS — R109 Unspecified abdominal pain: Secondary | ICD-10-CM | POA: Diagnosis not present

## 2019-05-16 DIAGNOSIS — Z5189 Encounter for other specified aftercare: Secondary | ICD-10-CM | POA: Diagnosis not present

## 2019-05-16 DIAGNOSIS — G893 Neoplasm related pain (acute) (chronic): Secondary | ICD-10-CM | POA: Diagnosis not present

## 2019-05-16 DIAGNOSIS — Z95828 Presence of other vascular implants and grafts: Secondary | ICD-10-CM

## 2019-05-16 DIAGNOSIS — K5900 Constipation, unspecified: Secondary | ICD-10-CM | POA: Diagnosis not present

## 2019-05-16 DIAGNOSIS — T451X5A Adverse effect of antineoplastic and immunosuppressive drugs, initial encounter: Secondary | ICD-10-CM | POA: Diagnosis not present

## 2019-05-16 DIAGNOSIS — Z23 Encounter for immunization: Secondary | ICD-10-CM

## 2019-05-16 DIAGNOSIS — D6481 Anemia due to antineoplastic chemotherapy: Secondary | ICD-10-CM | POA: Diagnosis not present

## 2019-05-16 DIAGNOSIS — G4700 Insomnia, unspecified: Secondary | ICD-10-CM

## 2019-05-16 DIAGNOSIS — M898X9 Other specified disorders of bone, unspecified site: Secondary | ICD-10-CM

## 2019-05-16 DIAGNOSIS — Z87891 Personal history of nicotine dependence: Secondary | ICD-10-CM | POA: Diagnosis not present

## 2019-05-16 DIAGNOSIS — D473 Essential (hemorrhagic) thrombocythemia: Secondary | ICD-10-CM | POA: Diagnosis not present

## 2019-05-16 DIAGNOSIS — R110 Nausea: Secondary | ICD-10-CM | POA: Insufficient documentation

## 2019-05-16 DIAGNOSIS — D75839 Thrombocytosis, unspecified: Secondary | ICD-10-CM

## 2019-05-16 DIAGNOSIS — C8331 Diffuse large B-cell lymphoma, lymph nodes of head, face, and neck: Secondary | ICD-10-CM | POA: Diagnosis not present

## 2019-05-16 LAB — CBC WITH DIFFERENTIAL (CANCER CENTER ONLY)
Abs Immature Granulocytes: 0.09 10*3/uL — ABNORMAL HIGH (ref 0.00–0.07)
Basophils Absolute: 0.1 10*3/uL (ref 0.0–0.1)
Basophils Relative: 2 %
Eosinophils Absolute: 0.3 10*3/uL (ref 0.0–0.5)
Eosinophils Relative: 5 %
HCT: 34.7 % — ABNORMAL LOW (ref 36.0–46.0)
Hemoglobin: 11.1 g/dL — ABNORMAL LOW (ref 12.0–15.0)
Immature Granulocytes: 2 %
Lymphocytes Relative: 16 %
Lymphs Abs: 0.9 10*3/uL (ref 0.7–4.0)
MCH: 26.8 pg (ref 26.0–34.0)
MCHC: 32 g/dL (ref 30.0–36.0)
MCV: 83.8 fL (ref 80.0–100.0)
Monocytes Absolute: 1 10*3/uL (ref 0.1–1.0)
Monocytes Relative: 19 %
Neutro Abs: 3.1 10*3/uL (ref 1.7–7.7)
Neutrophils Relative %: 56 %
Platelet Count: 409 10*3/uL — ABNORMAL HIGH (ref 150–400)
RBC: 4.14 MIL/uL (ref 3.87–5.11)
RDW: 17.6 % — ABNORMAL HIGH (ref 11.5–15.5)
WBC Count: 5.4 10*3/uL (ref 4.0–10.5)
nRBC: 0 % (ref 0.0–0.2)

## 2019-05-16 LAB — CMP (CANCER CENTER ONLY)
ALT: 14 U/L (ref 0–44)
AST: 17 U/L (ref 15–41)
Albumin: 3.4 g/dL — ABNORMAL LOW (ref 3.5–5.0)
Alkaline Phosphatase: 57 U/L (ref 38–126)
Anion gap: 7 (ref 5–15)
BUN: 9 mg/dL (ref 8–23)
CO2: 27 mmol/L (ref 22–32)
Calcium: 8.8 mg/dL — ABNORMAL LOW (ref 8.9–10.3)
Chloride: 107 mmol/L (ref 98–111)
Creatinine: 0.7 mg/dL (ref 0.44–1.00)
GFR, Est AFR Am: >60 mL/min (ref >60)
GFR, Estimated: >60 mL/min (ref >60)
Glucose, Bld: 99 mg/dL (ref 70–99)
Potassium: 3.8 mmol/L (ref 3.5–5.1)
Sodium: 141 mmol/L (ref 135–145)
Total Bilirubin: <0.2 mg/dL — ABNORMAL LOW (ref 0.3–1.2)
Total Protein: 6.2 g/dL — ABNORMAL LOW (ref 6.5–8.1)

## 2019-05-16 MED ORDER — SODIUM CHLORIDE 0.9% FLUSH
10.0000 mL | INTRAVENOUS | Status: DC | PRN
  Administered 2019-05-16: 10 mL
  Filled 2019-05-16: qty 10

## 2019-05-16 MED ORDER — INFLUENZA VAC A&B SA ADJ QUAD 0.5 ML IM PRSY
PREFILLED_SYRINGE | INTRAMUSCULAR | Status: AC
  Filled 2019-05-16: qty 0.5

## 2019-05-16 MED ORDER — ACETAMINOPHEN 325 MG PO TABS
ORAL_TABLET | ORAL | Status: AC
  Filled 2019-05-16: qty 2

## 2019-05-16 MED ORDER — SODIUM CHLORIDE 0.9% FLUSH
10.0000 mL | INTRAVENOUS | Status: DC | PRN
  Administered 2019-05-16: 08:00:00 10 mL via INTRAVENOUS
  Filled 2019-05-16: qty 10

## 2019-05-16 MED ORDER — VINCRISTINE SULFATE CHEMO INJECTION 1 MG/ML
2.0000 mg | Freq: Once | INTRAVENOUS | Status: AC
  Administered 2019-05-16: 2 mg via INTRAVENOUS
  Filled 2019-05-16: qty 2

## 2019-05-16 MED ORDER — DIPHENHYDRAMINE HCL 25 MG PO CAPS
50.0000 mg | ORAL_CAPSULE | Freq: Once | ORAL | Status: AC
  Administered 2019-05-16: 50 mg via ORAL

## 2019-05-16 MED ORDER — PALONOSETRON HCL INJECTION 0.25 MG/5ML
INTRAVENOUS | Status: AC
  Filled 2019-05-16: qty 5

## 2019-05-16 MED ORDER — SODIUM CHLORIDE 0.9 % IV SOLN
Freq: Once | INTRAVENOUS | Status: AC
  Administered 2019-05-16: 09:00:00 via INTRAVENOUS
  Filled 2019-05-16: qty 250

## 2019-05-16 MED ORDER — SODIUM CHLORIDE 0.9 % IV SOLN
375.0000 mg/m2 | Freq: Once | INTRAVENOUS | Status: AC
  Administered 2019-05-16: 12:00:00 600 mg via INTRAVENOUS
  Filled 2019-05-16: qty 10

## 2019-05-16 MED ORDER — DIPHENHYDRAMINE HCL 25 MG PO CAPS
ORAL_CAPSULE | ORAL | Status: AC
  Filled 2019-05-16: qty 2

## 2019-05-16 MED ORDER — SODIUM CHLORIDE 0.9 % IV SOLN
750.0000 mg/m2 | Freq: Once | INTRAVENOUS | Status: AC
  Administered 2019-05-16: 1180 mg via INTRAVENOUS
  Filled 2019-05-16: qty 59

## 2019-05-16 MED ORDER — INFLUENZA VAC A&B SA ADJ QUAD 0.5 ML IM PRSY
0.5000 mL | PREFILLED_SYRINGE | Freq: Once | INTRAMUSCULAR | Status: AC
  Administered 2019-05-16: 0.5 mL via INTRAMUSCULAR

## 2019-05-16 MED ORDER — LORAZEPAM 0.5 MG PO TABS: 0.5000 mg | ORAL_TABLET | Freq: Two times a day (BID) | ORAL | 0 refills | Status: DC | PRN

## 2019-05-16 MED ORDER — DOXORUBICIN HCL CHEMO IV INJECTION 2 MG/ML
50.0000 mg/m2 | Freq: Once | INTRAVENOUS | Status: AC
  Administered 2019-05-16: 80 mg via INTRAVENOUS
  Filled 2019-05-16: qty 40

## 2019-05-16 MED ORDER — ACETAMINOPHEN 325 MG PO TABS
650.0000 mg | ORAL_TABLET | Freq: Once | ORAL | Status: AC
  Administered 2019-05-16: 10:00:00 650 mg via ORAL

## 2019-05-16 MED ORDER — PALONOSETRON HCL INJECTION 0.25 MG/5ML
0.2500 mg | Freq: Once | INTRAVENOUS | Status: AC
  Administered 2019-05-16: 10:00:00 0.25 mg via INTRAVENOUS

## 2019-05-16 MED ORDER — SODIUM CHLORIDE 0.9 % IV SOLN
Freq: Once | INTRAVENOUS | Status: AC
  Administered 2019-05-16: 10:00:00 via INTRAVENOUS
  Filled 2019-05-16: qty 5

## 2019-05-16 MED ORDER — HEPARIN SOD (PORK) LOCK FLUSH 100 UNIT/ML IV SOLN
500.0000 [IU] | Freq: Once | INTRAVENOUS | Status: AC | PRN
  Administered 2019-05-16: 500 [IU]
  Filled 2019-05-16: qty 5

## 2019-05-16 NOTE — Progress Notes (Addendum)
[facility] OFFICE PROGRESS NOTE  Patient Care Team: Jonathon Jordan, MD as PCP - General (Family Medicine) Tish Men, MD as Consulting Physician (Hematology) Eppie Gibson, MD as Attending Physician (Radiation Oncology) Leota Sauers, RN as Oncology Nurse Navigator  HEME/ONC OVERVIEW: 1. 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 BCL2 rearrangement positive, no BCL6 or Myc rearrangement -03/2019: PET showed bulky left supraclavicular as well as left axillary, RP, mesenteric and iliac adenopathy; bone marrow bx negative for lymphoma  -Mid-03/2019 - present: R-CHOP with G-CSF support   2. 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 -S/p 2 cycles of R-CHOP with G-CSF  -Clinically, the bulky left supraclavicular LN's are improving size  -Labs adequate today, proceed with Cycle 2 of chemotherapy -I have ordered PET after 3 cycles of chemotherapy to assess interim response -If she has good response, she will need consolidative RT after completing chemotherapy due to the initial bulky disease  -PRN anti-emetics: Zofran, Compazine, and Ativan   Chemotherapy-associated anemia -Secondary to chemotherapy -Hgb 11.1, stable   -Patient denies any symptom of bleeding -We will monitor for now; no indication for dose adjustment -If anemia worsens in the future, we will consider delaying chemotherapy or adjusting chemotherapy dose  Thrombocytosis -Likely reactive in the setting of lymphoma and G-CSF -Plts 409k today, stable  -We will monitor it for now  Chemotherapy-associated nausea  -Secondary to chemotherapy -Patient had nausea starting around Day  3 of treatment, for which Compazine did not help -I discussed with patient about starting ppx Zofran TID x 2-3 days around Day 3 of chemotherapy to see if it helps with nausea -In addition, I have added PRN Ativan for breakthrough nausea  Orders Placed This Encounter  Procedures  . NM PET Image Restag (PS) Skull Base To Thigh    Standing Status:   Future    Standing Expiration Date:   05/15/2020    Scheduling Instructions:     Pls schedule prior to 06/06/2019    Order Specific Question:   ** REASON FOR EXAM (FREE TEXT)    Answer:   Assess interim response after 3 cycles of chemotherapy    Order Specific Question:   If indicated for the ordered procedure, I authorize the administration of a radiopharmaceutical per Radiology protocol    Answer:   Yes    Order Specific Question:   Radiology Contrast Protocol - do NOT remove file path    Answer:   [email or web address]   All questions were answered. The patient knows to call the clinic with any problems, questions or concerns. No barriers to learning was detected.  Return in 3 weeks for labs, port flush, clinic appointment, and Cycle 3 of R-CHOP.  Tish Men, MD 05/16/2019 9:15 AM  CHIEF COMPLAINT: "My body just hurts"  INTERVAL HISTORY: Ms. Diprima returns to clinic for follow-up of DLBCL of the left neck on R-CHOP.  Patient reports that starting around Day 3 of chemotherapy, she begins to develop some nausea, for which she took Compazine without any relief.  She was seen in the symptom management clinic and was given IV antiemetics with improvement.  She has not taking any Zofran.  Her lymphadenopathy  in the neck is improving.  She is otherwise tolerating treatment relatively well, and denies any other complaint today.  SUMMARY OF ONCOLOGIC HISTORY: Oncology History  DLBCL (diffuse large B cell lymphoma) ([place])  02/19/2019 Imaging   CT neck: IMPRESSION: 1. 7 cm left lower neck mass extending into the superior  mediastinum most concerning for malignancy. This may reflect a conglomerate nodal mass or primary tumor of uncertain origin. 2. Additional enlarged lymph nodes adjacent to the mass in the left supraclavicular/retroclavicular and left subpectoral regions. 3.  Aortic Atherosclerosis (ICD10-I70.0).   03/12/2019 Procedure   Incisional left cervical LN bx by Dr. Constance Holster   03/12/2019 Pathology Results   Accession: [id number] Soft tissue mass, simple excision, Left Neck - DIFFUSE LARGE B-CELL LYMPHOMA - SEE COMMENT   03/19/2019 Initial Diagnosis   DLBCL (diffuse large B cell lymphoma) ([place])   03/29/2019 Imaging   PET: IMPRESSION: 1. The large left supraclavicular mass is intensely hypermetabolic compatible with history of lymphoma. There are also nearby FDG avid left supraclavicular, left axillary, and left retroperitoneal lymph nodes. 2. Hypermetabolic retroperitoneal, mesenteric and iliac adenopathy. 3. Aortic Atherosclerosis (ICD10-I70.0). Coronary artery calcifications.   04/04/2019 -  Chemotherapy   The patient had DOXOrubicin (ADRIAMYCIN) chemo injection 80 mg, 50 mg/m2 = 80 mg, Intravenous,  Once, 2 of 6 cycles Administration: 80 mg (04/04/2019), 80 mg (04/25/2019) palonosetron (ALOXI) injection 0.25 mg, 0.25 mg, Intravenous,  Once, 2 of 6 cycles Administration: 0.25 mg (04/04/2019), 0.25 mg (04/25/2019) pegfilgrastim-jmdb (FULPHILA) injection 6 mg, 6 mg, Subcutaneous,  Once, 2 of 6 cycles Administration: 6 mg (04/06/2019) vinCRIStine (ONCOVIN) 2 mg in sodium chloride 0.9 % 50 mL chemo infusion, 2 mg, Intravenous,  Once, 2 of 6 cycles Administration: 2 mg (04/04/2019), 2 mg (04/25/2019) riTUXimab (RITUXAN) 600 mg in sodium chloride 0.9 % 250 mL (1.9355 mg/mL) infusion, 375 mg/m2 = 600 mg, Intravenous,  Once, 1 of 1 cycle Administration: 600 mg (04/04/2019) cyclophosphamide (CYTOXAN) 1,180 mg in sodium chloride 0.9 % 250 mL chemo infusion, 750 mg/m2 = 1,180 mg, Intravenous,  Once, 2 of 6  cycles Administration: 1,180 mg (04/04/2019), 1,180 mg (04/25/2019) riTUXimab (RITUXAN) 600 mg in sodium chloride 0.9 % 190 mL infusion, 375 mg/m2 = 600 mg, Intravenous,  Once, 1 of 5 cycles Administration: 600 mg (04/25/2019) fosaprepitant (EMEND) 150 mg, dexamethasone (DECADRON) 12 mg in sodium chloride 0.9 % 145 mL IVPB, , Intravenous,  Once, 2 of 6 cycles Administration:  (04/04/2019),  (04/25/2019)  for chemotherapy treatment.      REVIEW OF SYSTEMS:   Constitutional: ( - ) fevers, ( - )  chills , ( - ) night sweats Eyes: ( - ) blurriness of vision, ( - ) double vision, ( - ) watery eyes Ears, nose, mouth, throat, and face: ( - ) mucositis, ( - ) sore throat Respiratory: ( - ) cough, ( - ) dyspnea, ( - ) wheezes Cardiovascular: ( - ) palpitation, ( - ) chest discomfort, ( - ) lower extremity swelling Gastrointestinal:  ( + ) nausea, ( - ) heartburn, ( - ) change in bowel habits Skin: ( - ) abnormal skin rashes Lymphatics: ( + ) lymphadenopathy, ( - ) easy bruising Neurological: ( - ) numbness, ( - ) tingling, ( - ) new weaknesses Behavioral/Psych: ( - ) mood change, ( - ) new changes  All other systems were reviewed with the patient and are negative.  I have reviewed the past medical history, past surgical history, social history and family history with  the patient and they are unchanged from previous note.  ALLERGIES:  is allergic to codeine and sulfa antibiotics.  MEDICATIONS:  Current Outpatient Medications  Medication Sig Dispense Refill  . acetaminophen (TYLENOL) 325 MG tablet Take 650 mg by mouth every 6 (six) hours as needed.    [name] Kitchen allopurinol (ZYLOPRIM) 300 MG tablet Take 1 tablet (300 mg total) by mouth daily. 30 tablet 3  . Ascorbic Acid (VITAMIN C) 100 MG tablet Take 100 mg by mouth daily.    . Celecoxib (CELEBREX PO) Take by mouth.    [name] Kitchen HYDROcodone-acetaminophen (NORCO) 7.5-325 MG tablet Take 1 tablet by mouth every 6 (six) hours as needed for moderate pain. 120 tablet 0  .  lidocaine-prilocaine (EMLA) cream Apply to affected area once 30 g 3  . LORazepam (ATIVAN) 0.5 MG tablet Take 1 tablet (0.5 mg total) by mouth 2 (two) times daily as needed (for refractory nausea and vomiting). 30 tablet 0  . Multiple Vitamin (MULTIVITAMIN) tablet Take 1 tablet by mouth daily.    . ondansetron (ZOFRAN ODT) 4 MG disintegrating tablet Take 1 tablet (4 mg total) by mouth every 8 (eight) hours as needed for nausea or vomiting. 20 tablet 3  . ondansetron (ZOFRAN) 8 MG tablet Take 1 tablet (8 mg total) by mouth 2 (two) times daily as needed for refractory nausea / vomiting. Start on day 3 after cyclophosphamide chemotherapy. 30 tablet 1  . pegfilgrastim (NEULASTA ONPRO KIT) 6 MG/0.6ML injection Inject 6 mg into the skin once. Inject via provided programmed delivery device.    . predniSONE (DELTASONE) 20 MG tablet Take 20 mg by mouth daily. Take 5 tablets (175m) by mouth daily for 5 days. Take on days 1-5 of chemotherapy    . prochlorperazine (COMPAZINE) 10 MG tablet Take 1 tablet (10 mg total) by mouth every 6 (six) hours as needed (Nausea or vomiting). 30 tablet 6  . traMADol (ULTRAM) 50 MG tablet Take by mouth every 6 (six) hours as needed.    . traZODone (DESYREL) 50 MG tablet Take 1 tablet (50 mg total) by mouth at bedtime as needed for sleep. 30 tablet 5  . vitamin B-12 (CYANOCOBALAMIN) 100 MCG tablet Take 100 mcg by mouth daily.    .[name] Kitchenzolpidem (AMBIEN) 5 MG tablet Take 5 mg by mouth at bedtime as needed for sleep.     No current facility-administered medications for this visit.    Facility-Administered Medications Ordered in Other Visits  Medication Dose Route Frequency Provider Last Rate Last Dose  . influenza vaccine adjuvanted (FLUAD) injection 0.5 mL  0.5 mL Intramuscular Once ZTish Men MD        PHYSICAL EXAMINATION: ECOG PERFORMANCE STATUS: 2 - Symptomatic, <50% confined to bed  Today's Vitals   05/16/19 0831 05/16/19 0846  BP: 135/73   Pulse: (!) 102   Resp: 18    Temp: 98.2 F (36.8 C)   TempSrc: Oral   SpO2: 96%   Weight: 130 lb 1.6 oz (59 kg)   Height: '4\' 10"'  (1.473 m)   PainSc:  0-No pain   Body mass index is 27.19 kg/m.  Filed Weights   05/16/19 0831  Weight: 130 lb 1.6 oz (59 kg)    GENERAL: alert, no distress and comfortable SKIN: skin color, texture, turgor are normal, no rashes or significant lesions EYES: conjunctiva are pink and non-injected, sclera clear OROPHARYNX: no exudate, no erythema; lips, buccal mucosa, and tongue normal  NECK: supple, non-tender LYMPH:  Bulky left supraclavicular adenopathy, improving,  measuring ~3 x 3cm LUNGS: clear to auscultation with normal breathing effort HEART: regular rate & rhythm and no murmurs and no lower extremity edema ABDOMEN: soft, non-tender, non-distended, normal bowel sounds Musculoskeletal: no cyanosis of digits and no clubbing  PSYCH: alert & oriented x 3, fluent speech NEURO: no focal motor/sensory deficits  LABORATORY DATA:  I have reviewed the data as listed    Component Value Date/Time   NA 141 05/16/2019 0755   NA 137 01/16/2015   K 3.8 05/16/2019 0755   CL 107 05/16/2019 0755   CO2 27 05/16/2019 0755   GLUCOSE 99 05/16/2019 0755   BUN 9 05/16/2019 0755   BUN 12 01/16/2015   CREATININE 0.70 05/16/2019 0755   CALCIUM 8.8 (L) 05/16/2019 0755   PROT 6.2 (L) 05/16/2019 0755   ALBUMIN 3.4 (L) 05/16/2019 0755   AST 17 05/16/2019 0755   ALT 14 05/16/2019 0755   ALKPHOS 57 05/16/2019 0755   BILITOT <0.2 (L) 05/16/2019 0755   GFRNONAA >60 05/16/2019 0755   GFRAA >60 05/16/2019 0755    No results found for: SPEP, UPEP  Lab Results  Component Value Date   WBC 5.4 05/16/2019   NEUTROABS 3.1 05/16/2019   HGB 11.1 (L) 05/16/2019   HCT 34.7 (L) 05/16/2019   MCV 83.8 05/16/2019   PLT 409 (H) 05/16/2019      Chemistry      Component Value Date/Time   NA 141 05/16/2019 0755   NA 137 01/16/2015   K 3.8 05/16/2019 0755   CL 107 05/16/2019 0755   CO2 27  05/16/2019 0755   BUN 9 05/16/2019 0755   BUN 12 01/16/2015   CREATININE 0.70 05/16/2019 0755   GLU 96 01/16/2015      Component Value Date/Time   CALCIUM 8.8 (L) 05/16/2019 0755   ALKPHOS 57 05/16/2019 0755   AST 17 05/16/2019 0755   ALT 14 05/16/2019 0755   BILITOT <0.2 (L) 05/16/2019 0755       RADIOGRAPHIC STUDIES: I have personally reviewed the radiological images as listed below and agreed with the findings in the report. Dg Abd 2 Views  Result Date: 05/04/2019 CLINICAL DATA:  Diffuse abdominal pain for 1 week, history of lymphoma EXAM: ABDOMEN - 2 VIEW COMPARISON:  CT abdomen and pelvis 11/11/2016 FINDINGS: Scattered stool and gas throughout colon. Nonobstructive bowel gas pattern. No bowel wall thickening, bowel dilatation or free air. Numerous pelvic phleboliths. 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 IMPRESSION: Nonobstructive bowel gas pattern. Electronically Signed   By: Lavonia Dana M.D.   On: 05/04/2019 10:56   Mm 3d Screen Breast Bilateral  Result Date: 04/19/2019 CLINICAL DATA:  Screening. EXAM: DIGITAL SCREENING BILATERAL MAMMOGRAM WITH TOMO AND CAD COMPARISON:  Previous exam(s). ACR Breast Density Category c: The breast tissue is heterogeneously dense, which may obscure small masses. FINDINGS: There are no findings suspicious for malignancy. Images were processed with CAD. IMPRESSION: No mammographic evidence of malignancy. A result letter of this screening mammogram will be mailed directly to the patient. RECOMMENDATION: Screening mammogram in one year. (Code:[id number]-B-01Y) BI-RADS CATEGORY  1: Negative. Electronically Signed   By: Claudie Revering M.D.   On: 04/19/2019 16:51

## 2019-05-16 NOTE — Patient Instructions (Signed)

## 2019-05-16 NOTE — Patient Instructions (Addendum)
[facility] Discharge Instructions for Patients Receiving Chemotherapy  Today you received the following chemotherapy agents Doxorubicin (ADRIAMYCIN), Vincristine (ONCOVIN), Cyclophosphamide (CYTOXAN) & Rituximab (RITUXAN).  To help prevent nausea and vomiting after your treatment, we encourage you to take your nausea medication as prescribed.   If you develop nausea and vomiting that is not controlled by your nausea medication, call the clinic.   BELOW ARE SYMPTOMS THAT SHOULD BE REPORTED IMMEDIATELY:  *FEVER GREATER THAN 100.5 F  *CHILLS WITH OR WITHOUT FEVER  NAUSEA AND VOMITING THAT IS NOT CONTROLLED WITH YOUR NAUSEA MEDICATION  *UNUSUAL SHORTNESS OF BREATH  *UNUSUAL BRUISING OR BLEEDING  TENDERNESS IN MOUTH AND THROAT WITH OR WITHOUT PRESENCE OF ULCERS  *URINARY PROBLEMS  *BOWEL PROBLEMS  UNUSUAL RASH Items with * indicate a potential emergency and should be followed up as soon as possible.  Feel free to call the clinic should you have any questions or concerns. The clinic phone number is [phone].  Please show the CHEMO ALERT CARD at check-in to the Emergency Department and triage nurse.  

## 2019-05-16 NOTE — Progress Notes (Signed)
Nutrition Follow-up:  Patient with lymphoma followed by Dr. Maylon Peppers.  Patient receiving chemotherapy.    Spoke with patient during infusion today for nutrition follow-up.  Patient reports that over the last 2 weeks appetite has been good.  Reports that she really likes the ensure enlive shake the best.  Reports that she has enjoyed pizza recently, veggies and fruits.  Reports that she is starting to have an aversion to some meats.     Medications: reivewed  Labs: reviewed  Anthropometrics:   Weight 130 lb increased from 127 on 9/2.     NUTRITION DIAGNOSIS:  Unintentional weight loss improved   INTERVENTION:  Provided additional samples of shakes today for patient to try (ensure clear and orgain vegan.   Reviewed foods that contain protein other than meats.  Discussed ways to improve the taste and acceptance of meat.   Patient has contact information    MONITORING, EVALUATION, GOAL: Patient will increase calories and protein to prevent weight loss   NEXT VISIT: Wednesday, October 14th  Ashely Joshua B. Zenia Resides, [place], [place] Registered Dietitian [phone] (pager)

## 2019-05-16 NOTE — Addendum Note (Signed)
Addended by: Tish Men on: 05/16/2019 09:15 AM   Modules accepted: Orders

## 2019-05-18 ENCOUNTER — Inpatient Hospital Stay: Payer: PPO

## 2019-05-18 ENCOUNTER — Other Ambulatory Visit: Payer: Self-pay

## 2019-05-18 VITALS — BP 128/72 | HR 88 | Temp 98.7°F | Resp 18

## 2019-05-18 DIAGNOSIS — C8331 Diffuse large B-cell lymphoma, lymph nodes of head, face, and neck: Secondary | ICD-10-CM

## 2019-05-18 DIAGNOSIS — Z5189 Encounter for other specified aftercare: Secondary | ICD-10-CM | POA: Diagnosis not present

## 2019-05-18 MED ORDER — PEGFILGRASTIM-JMDB 6 MG/0.6ML ~~LOC~~ SOSY
PREFILLED_SYRINGE | SUBCUTANEOUS | Status: AC
  Filled 2019-05-18: qty 0.6

## 2019-05-18 MED ORDER — PEGFILGRASTIM-JMDB 6 MG/0.6ML ~~LOC~~ SOSY
6.0000 mg | PREFILLED_SYRINGE | Freq: Once | SUBCUTANEOUS | Status: AC
  Administered 2019-05-18: 10:00:00 6 mg via SUBCUTANEOUS

## 2019-05-18 NOTE — Patient Instructions (Signed)
[facility] Discharge Instructions for Patients Receiving Chemotherapy  Today you received the following chemotherapy agents Doxorubicin (ADRIAMYCIN), Vincristine (ONCOVIN), Cyclophosphamide (CYTOXAN) & Rituximab (RITUXAN).  To help prevent nausea and vomiting after your treatment, we encourage you to take your nausea medication as prescribed.   If you develop nausea and vomiting that is not controlled by your nausea medication, call the clinic.   BELOW ARE SYMPTOMS THAT SHOULD BE REPORTED IMMEDIATELY:  *FEVER GREATER THAN 100.5 F  *CHILLS WITH OR WITHOUT FEVER  NAUSEA AND VOMITING THAT IS NOT CONTROLLED WITH YOUR NAUSEA MEDICATION  *UNUSUAL SHORTNESS OF BREATH  *UNUSUAL BRUISING OR BLEEDING  TENDERNESS IN MOUTH AND THROAT WITH OR WITHOUT PRESENCE OF ULCERS  *URINARY PROBLEMS  *BOWEL PROBLEMS  UNUSUAL RASH Items with * indicate a potential emergency and should be followed up as soon as possible.  Feel free to call the clinic should you have any questions or concerns. The clinic phone number is [phone].  Please show the CHEMO ALERT CARD at check-in to the Emergency Department and triage nurse.  

## 2019-06-06 ENCOUNTER — Inpatient Hospital Stay: Payer: PPO

## 2019-06-06 ENCOUNTER — Inpatient Hospital Stay: Payer: PPO | Attending: Hematology | Admitting: Hematology

## 2019-06-06 ENCOUNTER — Other Ambulatory Visit: Payer: Self-pay

## 2019-06-06 ENCOUNTER — Encounter: Payer: Self-pay | Admitting: Hematology

## 2019-06-06 ENCOUNTER — Telehealth: Payer: Self-pay | Admitting: Hematology

## 2019-06-06 ENCOUNTER — Ambulatory Visit: Payer: PPO

## 2019-06-06 VITALS — BP 128/78 | HR 104 | Temp 98.5°F | Resp 16 | Ht <= 58 in | Wt 125.7 lb

## 2019-06-06 VITALS — BP 108/70 | HR 89 | Temp 98.7°F | Resp 16

## 2019-06-06 DIAGNOSIS — Z79899 Other long term (current) drug therapy: Secondary | ICD-10-CM | POA: Insufficient documentation

## 2019-06-06 DIAGNOSIS — Z5189 Encounter for other specified aftercare: Secondary | ICD-10-CM | POA: Diagnosis not present

## 2019-06-06 DIAGNOSIS — Z5112 Encounter for antineoplastic immunotherapy: Secondary | ICD-10-CM | POA: Insufficient documentation

## 2019-06-06 DIAGNOSIS — E46 Unspecified protein-calorie malnutrition: Secondary | ICD-10-CM | POA: Diagnosis not present

## 2019-06-06 DIAGNOSIS — C8331 Diffuse large B-cell lymphoma, lymph nodes of head, face, and neck: Secondary | ICD-10-CM | POA: Diagnosis not present

## 2019-06-06 DIAGNOSIS — G629 Polyneuropathy, unspecified: Secondary | ICD-10-CM | POA: Insufficient documentation

## 2019-06-06 DIAGNOSIS — T451X5A Adverse effect of antineoplastic and immunosuppressive drugs, initial encounter: Secondary | ICD-10-CM | POA: Diagnosis not present

## 2019-06-06 DIAGNOSIS — D6481 Anemia due to antineoplastic chemotherapy: Secondary | ICD-10-CM | POA: Insufficient documentation

## 2019-06-06 DIAGNOSIS — D75839 Thrombocytosis, unspecified: Secondary | ICD-10-CM

## 2019-06-06 DIAGNOSIS — D473 Essential (hemorrhagic) thrombocythemia: Secondary | ICD-10-CM | POA: Diagnosis not present

## 2019-06-06 DIAGNOSIS — Z95828 Presence of other vascular implants and grafts: Secondary | ICD-10-CM

## 2019-06-06 DIAGNOSIS — R110 Nausea: Secondary | ICD-10-CM | POA: Insufficient documentation

## 2019-06-06 DIAGNOSIS — Z791 Long term (current) use of non-steroidal anti-inflammatories (NSAID): Secondary | ICD-10-CM

## 2019-06-06 DIAGNOSIS — Z5111 Encounter for antineoplastic chemotherapy: Secondary | ICD-10-CM | POA: Diagnosis not present

## 2019-06-06 LAB — CBC WITH DIFFERENTIAL (CANCER CENTER ONLY)
Abs Immature Granulocytes: 0.12 10*3/uL — ABNORMAL HIGH (ref 0.00–0.07)
Basophils Absolute: 0.1 10*3/uL (ref 0.0–0.1)
Basophils Relative: 2 %
Eosinophils Absolute: 0.4 10*3/uL (ref 0.0–0.5)
Eosinophils Relative: 6 %
HCT: 36.1 % (ref 36.0–46.0)
Hemoglobin: 11.6 g/dL — ABNORMAL LOW (ref 12.0–15.0)
Immature Granulocytes: 2 %
Lymphocytes Relative: 16 %
Lymphs Abs: 1 10*3/uL (ref 0.7–4.0)
MCH: 27.1 pg (ref 26.0–34.0)
MCHC: 32.1 g/dL (ref 30.0–36.0)
MCV: 84.3 fL (ref 80.0–100.0)
Monocytes Absolute: 1.2 10*3/uL — ABNORMAL HIGH (ref 0.1–1.0)
Monocytes Relative: 20 %
Neutro Abs: 3.3 10*3/uL (ref 1.7–7.7)
Neutrophils Relative %: 54 %
Platelet Count: 461 10*3/uL — ABNORMAL HIGH (ref 150–400)
RBC: 4.28 MIL/uL (ref 3.87–5.11)
RDW: 19.8 % — ABNORMAL HIGH (ref 11.5–15.5)
WBC Count: 6.1 10*3/uL (ref 4.0–10.5)
nRBC: 0 % (ref 0.0–0.2)

## 2019-06-06 LAB — CMP (CANCER CENTER ONLY)
ALT: 17 U/L (ref 0–44)
AST: 21 U/L (ref 15–41)
Albumin: 3.4 g/dL — ABNORMAL LOW (ref 3.5–5.0)
Alkaline Phosphatase: 68 U/L (ref 38–126)
Anion gap: 10 (ref 5–15)
BUN: 11 mg/dL (ref 8–23)
CO2: 26 mmol/L (ref 22–32)
Calcium: 9.8 mg/dL (ref 8.9–10.3)
Chloride: 107 mmol/L (ref 98–111)
Creatinine: 0.72 mg/dL (ref 0.44–1.00)
GFR, Est AFR Am: >60 mL/min (ref >60)
GFR, Estimated: >60 mL/min (ref >60)
Glucose, Bld: 96 mg/dL (ref 70–99)
Potassium: 4 mmol/L (ref 3.5–5.1)
Sodium: 143 mmol/L (ref 135–145)
Total Bilirubin: <0.2 mg/dL — ABNORMAL LOW (ref 0.3–1.2)
Total Protein: 6.6 g/dL (ref 6.5–8.1)

## 2019-06-06 MED ORDER — ACETAMINOPHEN 325 MG PO TABS
650.0000 mg | ORAL_TABLET | Freq: Once | ORAL | Status: AC
  Administered 2019-06-06: 650 mg via ORAL

## 2019-06-06 MED ORDER — DIPHENHYDRAMINE HCL 25 MG PO CAPS
50.0000 mg | ORAL_CAPSULE | Freq: Once | ORAL | Status: AC
  Administered 2019-06-06: 50 mg via ORAL

## 2019-06-06 MED ORDER — DOXORUBICIN HCL CHEMO IV INJECTION 2 MG/ML
50.0000 mg/m2 | Freq: Once | INTRAVENOUS | Status: AC
  Administered 2019-06-06: 80 mg via INTRAVENOUS
  Filled 2019-06-06: qty 40

## 2019-06-06 MED ORDER — PALONOSETRON HCL INJECTION 0.25 MG/5ML
0.2500 mg | Freq: Once | INTRAVENOUS | Status: AC
  Administered 2019-06-06: 0.25 mg via INTRAVENOUS

## 2019-06-06 MED ORDER — PALONOSETRON HCL INJECTION 0.25 MG/5ML
INTRAVENOUS | Status: AC
  Filled 2019-06-06: qty 5

## 2019-06-06 MED ORDER — SODIUM CHLORIDE 0.9% FLUSH
10.0000 mL | INTRAVENOUS | Status: DC | PRN
  Administered 2019-06-06: 10 mL via INTRAVENOUS
  Filled 2019-06-06: qty 10

## 2019-06-06 MED ORDER — ACETAMINOPHEN 325 MG PO TABS
ORAL_TABLET | ORAL | Status: AC
  Filled 2019-06-06: qty 2

## 2019-06-06 MED ORDER — SODIUM CHLORIDE 0.9 % IV SOLN
Freq: Once | INTRAVENOUS | Status: AC
  Administered 2019-06-06: 12:00:00 via INTRAVENOUS
  Filled 2019-06-06: qty 250

## 2019-06-06 MED ORDER — SODIUM CHLORIDE 0.9 % IV SOLN
Freq: Once | INTRAVENOUS | Status: AC
  Administered 2019-06-06: 12:00:00 via INTRAVENOUS
  Filled 2019-06-06: qty 5

## 2019-06-06 MED ORDER — HEPARIN SOD (PORK) LOCK FLUSH 100 UNIT/ML IV SOLN
500.0000 [IU] | Freq: Once | INTRAVENOUS | Status: AC | PRN
  Administered 2019-06-06: 500 [IU]
  Filled 2019-06-06: qty 5

## 2019-06-06 MED ORDER — DIPHENHYDRAMINE HCL 25 MG PO CAPS
ORAL_CAPSULE | ORAL | Status: AC
  Filled 2019-06-06: qty 2

## 2019-06-06 MED ORDER — SODIUM CHLORIDE 0.9% FLUSH
10.0000 mL | INTRAVENOUS | Status: DC | PRN
  Administered 2019-06-06: 10 mL
  Filled 2019-06-06: qty 10

## 2019-06-06 MED ORDER — VINCRISTINE SULFATE CHEMO INJECTION 1 MG/ML
2.0000 mg | Freq: Once | INTRAVENOUS | Status: AC
  Administered 2019-06-06: 2 mg via INTRAVENOUS
  Filled 2019-06-06: qty 2

## 2019-06-06 MED ORDER — SODIUM CHLORIDE 0.9 % IV SOLN
750.0000 mg/m2 | Freq: Once | INTRAVENOUS | Status: AC
  Administered 2019-06-06: 1180 mg via INTRAVENOUS
  Filled 2019-06-06: qty 59

## 2019-06-06 MED ORDER — SODIUM CHLORIDE 0.9 % IV SOLN
375.0000 mg/m2 | Freq: Once | INTRAVENOUS | Status: AC
  Administered 2019-06-06: 600 mg via INTRAVENOUS
  Filled 2019-06-06: qty 10

## 2019-06-06 NOTE — Progress Notes (Signed)
Nutrition Follow-up:  Patient with lymphoma followed by Dr. Maylon Peppers.  Patient receiving chemotherapy.   Spoke with patient during infusion.  Reports that she is concerned about weight loss.  Reports that she is drinking ensure enlive but not daily.  Reports that often in the am will feel like she is going to gag while eating breakfast.  Does not typically take nausea medication.  Also reports feels like breads, pancakes, oatmeal make her gag.  Typically eats 3 meals per day.  Reports that bowels are moving regularly but did have issues with constipation.      Medications: reviewed  Labs: reviewed  Anthropometrics:   Weight decreased to 125 lb 11.2 oz today from 130 lb on 9/23   NUTRITION DIAGNOSIS:  Unintentional weight loss continues   INTERVENTION:  Patient planning to drink ensure enlive (350 calories, 20 g protein) 1-2 times per day Discussed strategies to increase calories and protein to prevent weight from decreasing.  Patient has contact information    MONITORING, EVALUATION, GOAL: Patient will increase calories and protein to prevent weight loss   NEXT VISIT: Wednesday, Nov 4th during infusion  Leila Schuff B. Zenia Resides, [place], [place] Registered Dietitian [phone] (pager)

## 2019-06-06 NOTE — Progress Notes (Signed)
[facility] OFFICE PROGRESS NOTE  Patient Care Team: Jonathon Jordan, MD as PCP - General (Family Medicine) Tish Men, MD as Consulting Physician (Hematology) Eppie Gibson, MD as Attending Physician (Radiation Oncology) Leota Sauers, RN as Oncology Nurse Navigator Jennet Maduro, RD as Dietitian (Dietician)  HEME/ONC OVERVIEW: 1. 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 BCL2 rearrangement positive, no BCL6 or Myc rearrangement -03/2019: left supraclavicular (bulky), left axillary, RP, mesenteric and iliac adenopathy on PET; bone marrow bx negative for lymphoma  -Mid-03/2019 - present: R-CHOP with G-CSF support   2. 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 -S/p 3 cycles of R-CHOP with G-CSF  -Clinically, the bulky left supraclavicular LN's are improving size  -Labs adequate today, proceed with Cycle 4 of chemotherapy -PET ordered after 3 cycles of chemotherapy but was not done; we will reschedule the PET after the 4th cycle of chemotherapy to assess disease response  -Clinically, her bulky cervical adenopathy has nearly normalized, c/w very good response  -She will also need radiation oncology evaluation after completing systemic therapy for consideration of ISRT, due to the initial bulky disease  -PRN anti-emetics: Zofran, Compazine, and Ativan   Chemotherapy-associated anemia -Secondary to chemotherapy -Hgb 11.6, stable   -Patient denies any symptom of bleeding -We will monitor for now; no indication for dose adjustment -If anemia worsens in the future, we will consider delaying chemotherapy or adjusting chemotherapy  dose  Thrombocytosis -Likely reactive in the setting of lymphoma and G-CSF -Plts 461k today, stable  -We will monitor it for now  Chemotherapy-associated nausea  -Secondary to chemotherapy -Overall relatively well controlled with Compazine and Ativan -Continue PRN-antiemetics  Protein malnutrition -Patient reports that she has lost a few pounds since starting treatment, despite relatively good appetite and nutritional supplements -I have consulted nutrition for further recommendations  Orders Placed This Encounter  Procedures  . Ambulatory referral to Nutrition and Diabetic E    Referral Priority:   Routine    Referral Type:   Consultation    Referral Reason:   Specialty Services Required    Number of Visits Requested:   1   All questions were answered. The patient knows to call the clinic with any problems, questions or concerns. No barriers to learning was detected.  Return in 3 weeks for labs, clinic appointment, and Cycle 5 of chemotherapy.  Tish Men, MD 06/06/2019 11:17 AM  CHIEF COMPLAINT: "I am doing pretty good"  INTERVAL HISTORY: Ms. Busser returns to clinic for follow-up of DLBCL on R-CHOP.  Patient reports that she has had occasional nausea, for which she takes Compazine and Ativan with relief.  She has not had any recurrent vomiting for several weeks.  She has a good appetite, and tries to eat well, including nutritional supplements, but she still has lost a few pounds over the past few weeks.  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  She denies any other complaint today.  REVIEW OF SYSTEMS:   Constitutional: ( - ) fevers, ( - )  chills , ( - ) night sweats Eyes: ( - ) blurriness of vision, ( - )  double vision, ( - ) watery eyes Ears, nose, mouth, throat, and face: ( - ) mucositis, ( - ) sore throat Respiratory: ( - ) cough, ( - ) dyspnea, ( - ) wheezes Cardiovascular: ( -  ) palpitation, ( - ) chest discomfort, ( - ) lower extremity swelling Gastrointestinal:  ( + ) nausea, ( - ) heartburn, ( - ) change in bowel habits Skin: ( - ) abnormal skin rashes Lymphatics: ( - ) new lymphadenopathy, ( - ) easy bruising Neurological: ( - ) numbness, ( + ) tingling, ( - ) new weaknesses Behavioral/Psych: ( - ) mood change, ( - ) new changes  All other systems were reviewed with the patient and are negative.  SUMMARY OF ONCOLOGIC HISTORY: Oncology History  DLBCL (diffuse large B cell lymphoma) ([facility])  02/19/2019 Imaging   CT neck: IMPRESSION: 1. 7 cm left lower neck mass extending into the superior mediastinum most concerning for malignancy. This may reflect a conglomerate nodal mass or primary tumor of uncertain origin. 2. Additional enlarged lymph nodes adjacent to the mass in the left supraclavicular/retroclavicular and left subpectoral regions. 3.  Aortic Atherosclerosis (ICD10-I70.0).   03/12/2019 Procedure   Incisional left cervical LN bx by Dr. Constance Holster   03/12/2019 Pathology Results   Accession: [id number] Soft tissue mass, simple excision, Left Neck - DIFFUSE LARGE B-CELL LYMPHOMA - SEE COMMENT   03/19/2019 Initial Diagnosis   DLBCL (diffuse large B cell lymphoma) ([facility])   03/29/2019 Imaging   PET: IMPRESSION: 1. The large left supraclavicular mass is intensely hypermetabolic compatible with history of lymphoma. There are also nearby FDG avid left supraclavicular, left axillary, and left retroperitoneal lymph nodes. 2. Hypermetabolic retroperitoneal, mesenteric and iliac adenopathy. 3. Aortic Atherosclerosis (ICD10-I70.0). Coronary artery calcifications.   04/04/2019 -  Chemotherapy   The patient had DOXOrubicin (ADRIAMYCIN) chemo injection 80 mg, 50 mg/m2 = 80 mg, Intravenous,  Once, 3 of 6 cycles Administration: 80 mg (04/04/2019), 80 mg (04/25/2019), 80 mg (05/16/2019) palonosetron (ALOXI) injection 0.25 mg, 0.25 mg, Intravenous,  Once, 3 of 6  cycles Administration: 0.25 mg (04/04/2019), 0.25 mg (04/25/2019), 0.25 mg (05/16/2019) pegfilgrastim-jmdb (FULPHILA) injection 6 mg, 6 mg, Subcutaneous,  Once, 3 of 6 cycles Administration: 6 mg (04/06/2019), 6 mg (04/27/2019) vinCRIStine (ONCOVIN) 2 mg in sodium chloride 0.9 % 50 mL chemo infusion, 2 mg, Intravenous,  Once, 3 of 6 cycles Administration: 2 mg (04/04/2019), 2 mg (04/25/2019), 2 mg (05/16/2019) riTUXimab (RITUXAN) 600 mg in sodium chloride 0.9 % 250 mL (1.9355 mg/mL) infusion, 375 mg/m2 = 600 mg, Intravenous,  Once, 1 of 1 cycle Administration: 600 mg (04/04/2019) cyclophosphamide (CYTOXAN) 1,180 mg in sodium chloride 0.9 % 250 mL chemo infusion, 750 mg/m2 = 1,180 mg, Intravenous,  Once, 3 of 6 cycles Administration: 1,180 mg (04/04/2019), 1,180 mg (04/25/2019), 1,180 mg (05/16/2019) riTUXimab (RITUXAN) 600 mg in sodium chloride 0.9 % 190 mL infusion, 375 mg/m2 = 600 mg, Intravenous,  Once, 2 of 5 cycles Administration: 600 mg (04/25/2019), 600 mg (05/16/2019) fosaprepitant (EMEND) 150 mg, dexamethasone (DECADRON) 12 mg in sodium chloride 0.9 % 145 mL IVPB, , Intravenous,  Once, 3 of 6 cycles Administration:  (04/04/2019),  (04/25/2019),  (05/16/2019)  for chemotherapy treatment.      I have reviewed the past medical history, past surgical history, social history and family history with the patient and they are unchanged from previous note.  ALLERGIES:  is allergic to codeine and sulfa antibiotics.  MEDICATIONS:  Current Outpatient Medications  Medication Sig Dispense Refill  .  acetaminophen (TYLENOL) 325 MG tablet Take 650 mg by mouth every 6 (six) hours as needed.    [name] Kitchen allopurinol (ZYLOPRIM) 300 MG tablet Take 1 tablet (300 mg total) by mouth daily. 30 tablet 3  . Ascorbic Acid (VITAMIN C) 100 MG tablet Take 100 mg by mouth daily.    . Celecoxib (CELEBREX PO) Take by mouth.    [name] Kitchen HYDROcodone-acetaminophen (NORCO) 7.5-325 MG tablet Take 1 tablet by mouth every 6 (six) hours as needed for  moderate pain. 120 tablet 0  . lidocaine-prilocaine (EMLA) cream Apply to affected area once 30 g 3  . LORazepam (ATIVAN) 0.5 MG tablet Take 1 tablet (0.5 mg total) by mouth 2 (two) times daily as needed (for refractory nausea and vomiting). 30 tablet 0  . Multiple Vitamin (MULTIVITAMIN) tablet Take 1 tablet by mouth daily.    . ondansetron (ZOFRAN ODT) 4 MG disintegrating tablet Take 1 tablet (4 mg total) by mouth every 8 (eight) hours as needed for nausea or vomiting. 20 tablet 3  . ondansetron (ZOFRAN) 8 MG tablet Take 1 tablet (8 mg total) by mouth 2 (two) times daily as needed for refractory nausea / vomiting. Start on day 3 after cyclophosphamide chemotherapy. 30 tablet 1  . pegfilgrastim (NEULASTA ONPRO KIT) 6 MG/0.6ML injection Inject 6 mg into the skin once. Inject via provided programmed delivery device.    . predniSONE (DELTASONE) 20 MG tablet Take 20 mg by mouth daily. Take 5 tablets (14m) by mouth daily for 5 days. Take on days 1-5 of chemotherapy    . prochlorperazine (COMPAZINE) 10 MG tablet Take 1 tablet (10 mg total) by mouth every 6 (six) hours as needed (Nausea or vomiting). 30 tablet 6  . traMADol (ULTRAM) 50 MG tablet Take by mouth every 6 (six) hours as needed.    . traZODone (DESYREL) 50 MG tablet Take 1 tablet (50 mg total) by mouth at bedtime as needed for sleep. 30 tablet 5  . vitamin B-12 (CYANOCOBALAMIN) 100 MCG tablet Take 100 mcg by mouth daily.    .[name] Kitchenzolpidem (AMBIEN) 5 MG tablet Take 5 mg by mouth at bedtime as needed for sleep.     No current facility-administered medications for this visit.     PHYSICAL EXAMINATION: ECOG PERFORMANCE STATUS: 2 - Symptomatic, <50% confined to bed  Today's Vitals   06/06/19 1054 06/06/19 1057  BP: 128/78   Pulse: (!) 104   Resp: 16   Temp: 98.5 F (36.9 C)   TempSrc: Temporal   SpO2: 98%   Weight: 125 lb 11.2 oz (57 kg)   Height: _0  (1.473 m)   PainSc:  0-No pain   Body mass index is 26.27 kg/m.  Filed Weights    06/06/19 1054  Weight: 125 lb 11.2 oz (57 kg)    GENERAL: alert, no distress and comfortable SKIN: skin color, texture, turgor are normal, no rashes or significant lesions EYES: conjunctiva are pink and non-injected, sclera clear OROPHARYNX: no exudate, no erythema; lips, buccal mucosa, and tongue normal  NECK: supple, non-tender LYMPH:  near complete resolution of left cervical and supraclavicular adenopathy, mild left supraclavicular fullness  LUNGS: clear to auscultation with normal breathing effort HEART: regular rate & rhythm and no murmurs and no lower extremity edema ABDOMEN: soft, non-tender, non-distended, normal bowel sounds Musculoskeletal: no cyanosis of digits and no clubbing  PSYCH: alert & oriented x 3, fluent speech NEURO: no focal motor/sensory deficits  LABORATORY DATA:  I have reviewed the data as listed  Component Value Date/Time   NA 141 05/16/2019 0755   NA 137 01/16/2015   K 3.8 05/16/2019 0755   CL 107 05/16/2019 0755   CO2 27 05/16/2019 0755   GLUCOSE 99 05/16/2019 0755   BUN 9 05/16/2019 0755   BUN 12 01/16/2015   CREATININE 0.70 05/16/2019 0755   CALCIUM 8.8 (L) 05/16/2019 0755   PROT 6.2 (L) 05/16/2019 0755   ALBUMIN 3.4 (L) 05/16/2019 0755   AST 17 05/16/2019 0755   ALT 14 05/16/2019 0755   ALKPHOS 57 05/16/2019 0755   BILITOT <0.2 (L) 05/16/2019 0755   GFRNONAA >60 05/16/2019 0755   GFRAA >60 05/16/2019 0755    No results found for: SPEP, UPEP  Lab Results  Component Value Date   WBC 6.1 06/06/2019   NEUTROABS 3.3 06/06/2019   HGB 11.6 (L) 06/06/2019   HCT 36.1 06/06/2019   MCV 84.3 06/06/2019   PLT 461 (H) 06/06/2019      Chemistry      Component Value Date/Time   NA 141 05/16/2019 0755   NA 137 01/16/2015   K 3.8 05/16/2019 0755   CL 107 05/16/2019 0755   CO2 27 05/16/2019 0755   BUN 9 05/16/2019 0755   BUN 12 01/16/2015   CREATININE 0.70 05/16/2019 0755   GLU 96 01/16/2015      Component Value Date/Time   CALCIUM  8.8 (L) 05/16/2019 0755   ALKPHOS 57 05/16/2019 0755   AST 17 05/16/2019 0755   ALT 14 05/16/2019 0755   BILITOT <0.2 (L) 05/16/2019 0755       RADIOGRAPHIC STUDIES: I have personally reviewed the radiological images as listed below and agreed with the findings in the report. No results found.

## 2019-06-06 NOTE — Patient Instructions (Signed)
[facility] Discharge Instructions for Patients Receiving Chemotherapy  Today you received the following chemotherapy agents Doxorubicin (ADRIAMYCIN), Vincristine (ONCOVIN), Cyclophosphamide (CYTOXAN) & Rituximab (RITUXAN).  To help prevent nausea and vomiting after your treatment, we encourage you to take your nausea medication as prescribed.   If you develop nausea and vomiting that is not controlled by your nausea medication, call the clinic.   BELOW ARE SYMPTOMS THAT SHOULD BE REPORTED IMMEDIATELY:  *FEVER GREATER THAN 100.5 F  *CHILLS WITH OR WITHOUT FEVER  NAUSEA AND VOMITING THAT IS NOT CONTROLLED WITH YOUR NAUSEA MEDICATION  *UNUSUAL SHORTNESS OF BREATH  *UNUSUAL BRUISING OR BLEEDING  TENDERNESS IN MOUTH AND THROAT WITH OR WITHOUT PRESENCE OF ULCERS  *URINARY PROBLEMS  *BOWEL PROBLEMS  UNUSUAL RASH Items with * indicate a potential emergency and should be followed up as soon as possible.  Feel free to call the clinic should you have any questions or concerns. The clinic phone number is [phone].  Please show the CHEMO ALERT CARD at check-in to the Emergency Department and triage nurse.  

## 2019-06-06 NOTE — Telephone Encounter (Signed)
Scheduled appt per 10/14 los - gave patient AVS and calender per los.   

## 2019-06-08 ENCOUNTER — Inpatient Hospital Stay: Payer: PPO

## 2019-06-08 ENCOUNTER — Other Ambulatory Visit: Payer: Self-pay

## 2019-06-08 VITALS — BP 128/72 | HR 78 | Temp 98.2°F | Resp 16

## 2019-06-08 DIAGNOSIS — C8331 Diffuse large B-cell lymphoma, lymph nodes of head, face, and neck: Secondary | ICD-10-CM

## 2019-06-08 DIAGNOSIS — Z5189 Encounter for other specified aftercare: Secondary | ICD-10-CM | POA: Diagnosis not present

## 2019-06-08 MED ORDER — PEGFILGRASTIM-JMDB 6 MG/0.6ML ~~LOC~~ SOSY
6.0000 mg | PREFILLED_SYRINGE | Freq: Once | SUBCUTANEOUS | Status: AC
  Administered 2019-06-08: 6 mg via SUBCUTANEOUS

## 2019-06-08 MED ORDER — PEGFILGRASTIM-JMDB 6 MG/0.6ML ~~LOC~~ SOSY
PREFILLED_SYRINGE | SUBCUTANEOUS | Status: AC
  Filled 2019-06-08: qty 0.6

## 2019-06-11 DIAGNOSIS — H524 Presbyopia: Secondary | ICD-10-CM | POA: Diagnosis not present

## 2019-06-12 ENCOUNTER — Encounter (HOSPITAL_COMMUNITY)
Admission: RE | Admit: 2019-06-12 | Discharge: 2019-06-12 | Disposition: A | Discharge status: Home / Self Care | Payer: PPO | Source: Ambulatory Visit | Attending: Hematology | Admitting: Hematology

## 2019-06-12 ENCOUNTER — Telehealth: Payer: Self-pay | Admitting: *Deleted

## 2019-06-12 ENCOUNTER — Other Ambulatory Visit: Payer: Self-pay

## 2019-06-12 DIAGNOSIS — Z79899 Other long term (current) drug therapy: Secondary | ICD-10-CM | POA: Insufficient documentation

## 2019-06-12 DIAGNOSIS — C8330 Diffuse large B-cell lymphoma, unspecified site: Secondary | ICD-10-CM | POA: Diagnosis not present

## 2019-06-12 DIAGNOSIS — C8331 Diffuse large B-cell lymphoma, lymph nodes of head, face, and neck: Secondary | ICD-10-CM | POA: Diagnosis not present

## 2019-06-12 DIAGNOSIS — I700 Atherosclerosis of aorta: Secondary | ICD-10-CM | POA: Diagnosis not present

## 2019-06-12 LAB — GLUCOSE, CAPILLARY: Glucose-Capillary: 104 mg/dL — ABNORMAL HIGH (ref 70–99)

## 2019-06-12 MED ORDER — FLUDEOXYGLUCOSE F - 18 (FDG) INJECTION
6.2500 | Freq: Once | INTRAVENOUS | Status: AC
  Administered 2019-06-12: 6.25 via INTRAVENOUS

## 2019-06-12 NOTE — Telephone Encounter (Signed)
Oncology Nurse Navigator Documentation  Ms. Pickrel called to confirm this afternoon's PET appt.  She verified she has been NPO since 9:00 this morning.  [name], RN, BSN Head & Neck Oncology Nurse [place] [facility] [phone]

## 2019-06-15 ENCOUNTER — Telehealth: Payer: Self-pay

## 2019-06-15 ENCOUNTER — Other Ambulatory Visit: Payer: Self-pay | Admitting: Hematology

## 2019-06-15 DIAGNOSIS — T451X5A Adverse effect of antineoplastic and immunosuppressive drugs, initial encounter: Secondary | ICD-10-CM

## 2019-06-15 DIAGNOSIS — G620 Drug-induced polyneuropathy: Secondary | ICD-10-CM

## 2019-06-15 MED ORDER — GABAPENTIN 400 MG PO CAPS: 400.0000 mg | ORAL_CAPSULE | Freq: Two times a day (BID) | ORAL | 2 refills | Status: DC

## 2019-06-15 NOTE — Telephone Encounter (Signed)
Received call from pt with several concerns to be addressed by Dr Maylon Peppers. Patient reports increased neuropathy to bilateral hands, with left being worse. Pt states she is able to do buttons and hooks but "I can be holding something like a pill and when I go to take it i've dropped it without knowing." Denies pain, reporting numbness & tingling.  Pt states she has a cap loose and is due for routine dental cleaning. Questions if she can go to dentist. Pt requests results of PET scan.  Pt reports decreased appetite and nausea. Pt states she tries to eat "but after a few bites I start gagging." Pt has Zofran, Compazine & Ativan for nausea. Is pushing PO water and Ensure & does not believe to be dehydrated. Pt states after she starting gagging after breakfast this am, she took a Zofran. Pt states she is only taking anti-emetics after she feels nauseated.   Recommendations by Dr Maylon Peppers as follows:   1) Neurontin 400mg  bid called to pt's pharmacy. Patient aware to be mindful of dizziness or drowsiness.  2) Please hold off on dental appointments at this time.  3) PET is "better." Specifics to be discussed at next office visit on 11/4. 4) Pt instructed to take Zofran scheduled 30 minutes to 1 hour before breakfast and supper meals & use Compazine and Ativan for breakthrough. Pt to notify office if this is not effective.   Pt verbalizes understanding using teach back and expresses appreciation. dph

## 2019-06-27 ENCOUNTER — Inpatient Hospital Stay: Payer: PPO

## 2019-06-27 ENCOUNTER — Encounter: Payer: Self-pay | Admitting: Hematology

## 2019-06-27 ENCOUNTER — Inpatient Hospital Stay (HOSPITAL_BASED_OUTPATIENT_CLINIC_OR_DEPARTMENT_OTHER): Payer: PPO | Admitting: Hematology

## 2019-06-27 ENCOUNTER — Inpatient Hospital Stay: Payer: PPO | Attending: Hematology

## 2019-06-27 ENCOUNTER — Other Ambulatory Visit: Payer: Self-pay

## 2019-06-27 VITALS — BP 116/73 | HR 102 | Temp 98.2°F | Resp 18 | Ht <= 58 in | Wt 124.6 lb

## 2019-06-27 VITALS — BP 91/59 | HR 97 | Temp 98.7°F | Resp 18

## 2019-06-27 DIAGNOSIS — Z5112 Encounter for antineoplastic immunotherapy: Secondary | ICD-10-CM | POA: Insufficient documentation

## 2019-06-27 DIAGNOSIS — D6959 Other secondary thrombocytopenia: Secondary | ICD-10-CM | POA: Diagnosis not present

## 2019-06-27 DIAGNOSIS — D6481 Anemia due to antineoplastic chemotherapy: Secondary | ICD-10-CM | POA: Insufficient documentation

## 2019-06-27 DIAGNOSIS — C8331 Diffuse large B-cell lymphoma, lymph nodes of head, face, and neck: Secondary | ICD-10-CM

## 2019-06-27 DIAGNOSIS — Z79899 Other long term (current) drug therapy: Secondary | ICD-10-CM

## 2019-06-27 DIAGNOSIS — T451X5A Adverse effect of antineoplastic and immunosuppressive drugs, initial encounter: Secondary | ICD-10-CM

## 2019-06-27 DIAGNOSIS — Z791 Long term (current) use of non-steroidal anti-inflammatories (NSAID): Secondary | ICD-10-CM

## 2019-06-27 DIAGNOSIS — K219 Gastro-esophageal reflux disease without esophagitis: Secondary | ICD-10-CM | POA: Diagnosis not present

## 2019-06-27 DIAGNOSIS — Z5111 Encounter for antineoplastic chemotherapy: Secondary | ICD-10-CM | POA: Diagnosis not present

## 2019-06-27 DIAGNOSIS — Z5189 Encounter for other specified aftercare: Secondary | ICD-10-CM | POA: Insufficient documentation

## 2019-06-27 DIAGNOSIS — R7989 Other specified abnormal findings of blood chemistry: Secondary | ICD-10-CM | POA: Insufficient documentation

## 2019-06-27 DIAGNOSIS — R110 Nausea: Secondary | ICD-10-CM | POA: Diagnosis not present

## 2019-06-27 DIAGNOSIS — D473 Essential (hemorrhagic) thrombocythemia: Secondary | ICD-10-CM

## 2019-06-27 DIAGNOSIS — Z95828 Presence of other vascular implants and grafts: Secondary | ICD-10-CM

## 2019-06-27 DIAGNOSIS — G620 Drug-induced polyneuropathy: Secondary | ICD-10-CM | POA: Insufficient documentation

## 2019-06-27 DIAGNOSIS — D75839 Thrombocytosis, unspecified: Secondary | ICD-10-CM

## 2019-06-27 LAB — CBC WITH DIFFERENTIAL (CANCER CENTER ONLY)
Abs Immature Granulocytes: 0.11 10*3/uL — ABNORMAL HIGH (ref 0.00–0.07)
Basophils Absolute: 0.1 10*3/uL (ref 0.0–0.1)
Basophils Relative: 1 %
Eosinophils Absolute: 0.1 10*3/uL (ref 0.0–0.5)
Eosinophils Relative: 2 %
HCT: 34.2 % — ABNORMAL LOW (ref 36.0–46.0)
Hemoglobin: 11 g/dL — ABNORMAL LOW (ref 12.0–15.0)
Immature Granulocytes: 2 %
Lymphocytes Relative: 12 %
Lymphs Abs: 0.6 10*3/uL — ABNORMAL LOW (ref 0.7–4.0)
MCH: 27.7 pg (ref 26.0–34.0)
MCHC: 32.2 g/dL (ref 30.0–36.0)
MCV: 86.1 fL (ref 80.0–100.0)
Monocytes Absolute: 1.3 10*3/uL — ABNORMAL HIGH (ref 0.1–1.0)
Monocytes Relative: 26 %
Neutro Abs: 3 10*3/uL (ref 1.7–7.7)
Neutrophils Relative %: 57 %
Platelet Count: 463 10*3/uL — ABNORMAL HIGH (ref 150–400)
RBC: 3.97 MIL/uL (ref 3.87–5.11)
RDW: 21.6 % — ABNORMAL HIGH (ref 11.5–15.5)
WBC Count: 5.2 10*3/uL (ref 4.0–10.5)
nRBC: 0 % (ref 0.0–0.2)

## 2019-06-27 LAB — CMP (CANCER CENTER ONLY)
ALT: 17 U/L (ref 0–44)
AST: 20 U/L (ref 15–41)
Albumin: 3.6 g/dL (ref 3.5–5.0)
Alkaline Phosphatase: 52 U/L (ref 38–126)
Anion gap: 10 (ref 5–15)
BUN: 11 mg/dL (ref 8–23)
CO2: 24 mmol/L (ref 22–32)
Calcium: 9.2 mg/dL (ref 8.9–10.3)
Chloride: 105 mmol/L (ref 98–111)
Creatinine: 0.81 mg/dL (ref 0.44–1.00)
GFR, Est AFR Am: >60 mL/min (ref >60)
GFR, Estimated: >60 mL/min (ref >60)
Glucose, Bld: 99 mg/dL (ref 70–99)
Potassium: 4.3 mmol/L (ref 3.5–5.1)
Sodium: 139 mmol/L (ref 135–145)
Total Bilirubin: 0.2 mg/dL — ABNORMAL LOW (ref 0.3–1.2)
Total Protein: 6.5 g/dL (ref 6.5–8.1)

## 2019-06-27 MED ORDER — VINCRISTINE SULFATE CHEMO INJECTION 1 MG/ML
2.0000 mg | Freq: Once | INTRAVENOUS | Status: AC
  Administered 2019-06-27: 2 mg via INTRAVENOUS
  Filled 2019-06-27: qty 2

## 2019-06-27 MED ORDER — SODIUM CHLORIDE 0.9% FLUSH
10.0000 mL | INTRAVENOUS | Status: DC | PRN
  Administered 2019-06-27: 10:00:00 10 mL via INTRAVENOUS
  Filled 2019-06-27: qty 10

## 2019-06-27 MED ORDER — HEPARIN SOD (PORK) LOCK FLUSH 100 UNIT/ML IV SOLN
500.0000 [IU] | Freq: Once | INTRAVENOUS | Status: AC | PRN
  Administered 2019-06-27: 500 [IU]
  Filled 2019-06-27: qty 5

## 2019-06-27 MED ORDER — DIPHENHYDRAMINE HCL 25 MG PO CAPS
50.0000 mg | ORAL_CAPSULE | Freq: Once | ORAL | Status: AC
  Administered 2019-06-27: 50 mg via ORAL

## 2019-06-27 MED ORDER — PALONOSETRON HCL INJECTION 0.25 MG/5ML
0.2500 mg | Freq: Once | INTRAVENOUS | Status: AC
  Administered 2019-06-27: 0.25 mg via INTRAVENOUS

## 2019-06-27 MED ORDER — DIPHENHYDRAMINE HCL 25 MG PO CAPS
ORAL_CAPSULE | ORAL | Status: AC
  Filled 2019-06-27: qty 2

## 2019-06-27 MED ORDER — ACETAMINOPHEN 325 MG PO TABS
650.0000 mg | ORAL_TABLET | Freq: Once | ORAL | Status: AC
  Administered 2019-06-27: 650 mg via ORAL

## 2019-06-27 MED ORDER — SODIUM CHLORIDE 0.9 % IV SOLN
Freq: Once | INTRAVENOUS | Status: AC
  Administered 2019-06-27: 12:00:00 via INTRAVENOUS
  Filled 2019-06-27: qty 5

## 2019-06-27 MED ORDER — ACETAMINOPHEN 325 MG PO TABS
ORAL_TABLET | ORAL | Status: AC
  Filled 2019-06-27: qty 2

## 2019-06-27 MED ORDER — PALONOSETRON HCL INJECTION 0.25 MG/5ML
INTRAVENOUS | Status: AC
  Filled 2019-06-27: qty 5

## 2019-06-27 MED ORDER — PANTOPRAZOLE SODIUM 20 MG PO TBEC: 20.0000 mg | DELAYED_RELEASE_TABLET | Freq: Every day | ORAL | 5 refills | Status: DC

## 2019-06-27 MED ORDER — SODIUM CHLORIDE 0.9 % IV SOLN
Freq: Once | INTRAVENOUS | Status: AC
  Administered 2019-06-27: 11:00:00 via INTRAVENOUS
  Filled 2019-06-27: qty 250

## 2019-06-27 MED ORDER — DOXORUBICIN HCL CHEMO IV INJECTION 2 MG/ML
50.0000 mg/m2 | Freq: Once | INTRAVENOUS | Status: AC
  Administered 2019-06-27: 80 mg via INTRAVENOUS
  Filled 2019-06-27: qty 40

## 2019-06-27 MED ORDER — SODIUM CHLORIDE 0.9 % IV SOLN
375.0000 mg/m2 | Freq: Once | INTRAVENOUS | Status: AC
  Administered 2019-06-27: 600 mg via INTRAVENOUS
  Filled 2019-06-27: qty 50

## 2019-06-27 MED ORDER — SODIUM CHLORIDE 0.9 % IV SOLN
750.0000 mg/m2 | Freq: Once | INTRAVENOUS | Status: AC
  Administered 2019-06-27: 1180 mg via INTRAVENOUS
  Filled 2019-06-27: qty 59

## 2019-06-27 MED ORDER — SODIUM CHLORIDE 0.9% FLUSH
10.0000 mL | INTRAVENOUS | Status: DC | PRN
  Administered 2019-06-27: 10 mL
  Filled 2019-06-27: qty 10

## 2019-06-27 NOTE — Patient Instructions (Signed)
[facility] Discharge Instructions for Patients Receiving Chemotherapy  Today you received the following chemotherapy agents Adriamycin, Vincristine, Cytoxan and Rituxan  To help prevent nausea and vomiting after your treatment, we encourage you to take your nausea medication as directed.    If you develop nausea and vomiting that is not controlled by your nausea medication, call the clinic.   BELOW ARE SYMPTOMS THAT SHOULD BE REPORTED IMMEDIATELY:  *FEVER GREATER THAN 100.5 F  *CHILLS WITH OR WITHOUT FEVER  NAUSEA AND VOMITING THAT IS NOT CONTROLLED WITH YOUR NAUSEA MEDICATION  *UNUSUAL SHORTNESS OF BREATH  *UNUSUAL BRUISING OR BLEEDING  TENDERNESS IN MOUTH AND THROAT WITH OR WITHOUT PRESENCE OF ULCERS  *URINARY PROBLEMS  *BOWEL PROBLEMS  UNUSUAL RASH Items with * indicate a potential emergency and should be followed up as soon as possible.  Feel free to call the clinic should you have any questions or concerns. The clinic phone number is [phone].  Please show the CHEMO ALERT CARD at check-in to the Emergency Department and triage nurse.   

## 2019-06-27 NOTE — Progress Notes (Signed)
Nutrition Follow-up:  Patient with lymphoma followed by Dr. Zhao.  Patient receiving chemotherapy.   Met with patient during infusion.  Patient reports that her nausea has been better controlled over the last 2 weeks but knows she still is not eating enough.  Reports issues with reflux and reports MD is prescribing medication to help with this today.  Yesterday ate soup with lima beans and corn in it, small amount of yogurt, banana and peanut butter sandwich.  Has been having a hard time trying to get ensure in everyday as well due.   Medications: protonix added, zofran, compazine, ativan  Labs: reviewed  Anthropometrics:   Weight 124 lb 9.6 oz today decreased from 125 lb 11.2 oz on 10/14  NUTRITION DIAGNOSIS: Unintentional weight loss continues   INTERVENTION:  Discussed ways to get in ensure enlive daily.  Discussed foods and techniques to help with reflux symptoms Patient has contact information    MONITORING, EVALUATION, GOAL: Patient will increase calories and protein to prevent weight loss   NEXT VISIT: November 25 during infusion   B. , RD, LDN Registered Dietitian [phone] (pager)     

## 2019-06-27 NOTE — Progress Notes (Signed)
Sandra Payne OFFICE PROGRESS NOTE  Patient Care Team: Jonathon Jordan, MD as PCP - General (Family Medicine) Tish Men, MD as Consulting Physician (Hematology) Eppie Gibson, MD as Attending Physician (Radiation Oncology) Leota Sauers, RN as Oncology Nurse Navigator Jennet Maduro, RD as Dietitian (Dietician)  HEME/ONC OVERVIEW: 1. 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 BCL2 rearrangement positive, no BCL6 or Myc rearrangement -03/2019: left supraclavicular (bulky), left axillary, RP, mesenteric and iliac adenopathy on PET; bone marrow bx negative for lymphoma  -Mid-03/2019 - present: R-CHOP with G-CSF support   Interim PET (after 4 cycles) showed decrease in L supraclavicular mass and resolution of other LN disease   2. 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  In addition, there was resolution of other hypermetabolic disease, including the retropectoral, retroperitoneal, and mesenteric LN's.  -I reviewed the imaging results in detail with the patient -The case was also discussed extensively at the head and neck tumor board.  The consensus was to proceed with 2 additional cycles of chemotherapy, followed by ISRT to the left supraclavicular nodal disease. -Labs adequate today, proceed with Cycle 5 of chemotherapy -We will repeat PET at the end of chemotherapy to  assess any interval changes before transitioning to ISRT  -I have also placed referral to radiation oncology in anticipation of ISRT  -PRN anti-emetics: Zofran, Compazine, and Ativan   Chemotherapy-associated anemia -Secondary to chemotherapy -Hgb 11.0, stable   -Patient denies any symptom of bleeding -We will monitor for now; no indication for dose adjustment -If anemia worsens in the future, we will consider delaying chemotherapy or adjusting chemotherapy dose  Thrombocytosis -Likely reactive in the setting of lymphoma and G-CSF -Plts 463k today, stable  -We will monitor it for now  Chemotherapy-associated nausea  -Secondary to chemotherapy -Overall relatively well controlled with Compazine and Ativan -Continue PRN-antiemetics  Acid reflux -Possibly induced by steroids -I have prescribed Protonix 70m daily   Orders Placed This Encounter  Procedures  . Ambulatory referral to Radiation Oncology    Referral Priority:   Routine    Referral Type:   Consultation    Referral Reason:   Specialty Services Required    Requested Specialty:   Radiation Oncology    Number of Visits Requested:   1   All questions were answered. The patient knows to call the clinic with any problems, questions or concerns. No barriers to learning was detected.  Return in 3 weeks for Cycle 6 of chemotherapy.   YTish Men MD 06/27/2019 10:56 AM  CHIEF COMPLAINT: "I am having a sore burp"  INTERVAL HISTORY: Sandra Payne to clinic for follow-up of diffuse large B-cell lymphoma.  Patient reports that over the past few weeks, she has had intermittent reflux symptoms, primarily manifesting as "a sour burp" after eating.  She denies any associated abdominal pain.  She has not had significant nausea after the last cycle of chemotherapy,  and the end time-emetics appears to be helping.  She denies any other complaint today.  REVIEW OF SYSTEMS:   Constitutional: ( - ) fevers, ( - )  chills , ( - ) night  sweats Eyes: ( - ) blurriness of vision, ( - ) double vision, ( - ) watery eyes Ears, nose, mouth, throat, and face: ( - ) mucositis, ( - ) sore throat Respiratory: ( - ) cough, ( - ) dyspnea, ( - ) wheezes Cardiovascular: ( - ) palpitation, ( - ) chest discomfort, ( - ) lower extremity swelling Gastrointestinal:  ( - ) nausea, ( + ) heartburn, ( - ) change in bowel habits Skin: ( - ) abnormal skin rashes Lymphatics: ( - ) new lymphadenopathy, ( - ) easy bruising Neurological: ( - ) numbness, ( - ) tingling, ( - ) new weaknesses Behavioral/Psych: ( - ) mood change, ( - ) new changes  All other systems were reviewed with the patient and are negative.  SUMMARY OF ONCOLOGIC HISTORY: Oncology History  DLBCL (diffuse large B cell lymphoma) ([place])  02/19/2019 Imaging   CT neck: IMPRESSION: 1. 7 cm left lower neck mass extending into the superior mediastinum most concerning for malignancy. This may reflect a conglomerate nodal mass or primary tumor of uncertain origin. 2. Additional enlarged lymph nodes adjacent to the mass in the left supraclavicular/retroclavicular and left subpectoral regions. 3.  Aortic Atherosclerosis (ICD10-I70.0).   03/12/2019 Procedure   Incisional left cervical LN bx by Dr. Constance Holster   03/12/2019 Pathology Results   Accession: [id number] Soft tissue mass, simple excision, Left Neck - DIFFUSE LARGE B-CELL LYMPHOMA - SEE COMMENT   03/19/2019 Initial Diagnosis   DLBCL (diffuse large B cell lymphoma) ([place])   03/29/2019 Imaging   PET: IMPRESSION: 1. The large left supraclavicular mass is intensely hypermetabolic compatible with history of lymphoma. There are also nearby FDG avid left supraclavicular, left axillary, and left retroperitoneal lymph nodes. 2. Hypermetabolic retroperitoneal, mesenteric and iliac adenopathy. 3. Aortic Atherosclerosis (ICD10-I70.0). Coronary artery calcifications.   04/04/2019 -  Chemotherapy   The patient had DOXOrubicin (ADRIAMYCIN)  chemo injection 80 mg, 50 mg/m2 = 80 mg, Intravenous,  Once, 5 of 6 cycles Administration: 80 mg (04/04/2019), 80 mg (04/25/2019), 80 mg (05/16/2019), 80 mg (06/06/2019) palonosetron (ALOXI) injection 0.25 mg, 0.25 mg, Intravenous,  Once, 5 of 6 cycles Administration: 0.25 mg (04/04/2019), 0.25 mg (04/25/2019), 0.25 mg (05/16/2019), 0.25 mg (06/06/2019) pegfilgrastim-jmdb (FULPHILA) injection 6 mg, 6 mg, Subcutaneous,  Once, 5 of 6 cycles Administration: 6 mg (04/06/2019), 6 mg (04/27/2019), 6 mg (05/18/2019), 6 mg (06/08/2019) vinCRIStine (ONCOVIN) 2 mg in sodium chloride 0.9 % 50 mL chemo infusion, 2 mg, Intravenous,  Once, 5 of 6 cycles Administration: 2 mg (04/04/2019), 2 mg (04/25/2019), 2 mg (05/16/2019), 2 mg (06/06/2019) riTUXimab (RITUXAN) 600 mg in sodium chloride 0.9 % 250 mL (1.9355 mg/mL) infusion, 375 mg/m2 = 600 mg, Intravenous,  Once, 1 of 1 cycle Administration: 600 mg (04/04/2019) cyclophosphamide (CYTOXAN) 1,180 mg in sodium chloride 0.9 % 250 mL chemo infusion, 750 mg/m2 = 1,180 mg, Intravenous,  Once, 5 of 6 cycles Administration: 1,180 mg (04/04/2019), 1,180 mg (04/25/2019), 1,180 mg (05/16/2019), 1,180 mg (06/06/2019) riTUXimab (RITUXAN) 600 mg in sodium chloride 0.9 % 190 mL infusion, 375 mg/m2 = 600 mg, Intravenous,  Once, 4 of 5 cycles Administration: 600 mg (04/25/2019), 600 mg (05/16/2019), 600 mg (06/06/2019) fosaprepitant (EMEND) 150 mg, dexamethasone (DECADRON) 12 mg in sodium chloride 0.9 % 145 mL IVPB, , Intravenous,  Once, 5 of 6 cycles Administration:  (04/04/2019),  (04/25/2019),  (05/16/2019),  (06/06/2019)  for chemotherapy treatment.    06/12/2019 Imaging   PET (after 4 cycles of R-CHOP): IMPRESSION: 1. Partial treatment response. Dominant left supraclavicular nodal mass is decreased in size and metabolism, with persistent Deauville score 5 uptake. Previously visualized hypermetabolic left retropectoral, retroperitoneal and lower mesenteric lymph nodes have resolved. 2. Stable  low level splenic hypermetabolism, nonspecific, possibly reactive. Normal size spleen. No splenic masses. 3. Low level hypermetabolism throughout the skeleton, compatible with mildly stimulated marrow state. 4. Asymmetric right glottic uptake without CT mass correlate, cannot exclude left focal cord paralysis. 5.  Aortic Atherosclerosis (ICD10-I70.0).     I have reviewed the past medical history, past surgical history, social history and family history with the patient and they are unchanged from previous note.  ALLERGIES:  is allergic to codeine and sulfa antibiotics.  MEDICATIONS:  Current Outpatient Medications  Medication Sig Dispense Refill  . allopurinol (ZYLOPRIM) 300 MG tablet Take 1 tablet (300 mg total) by mouth daily. 30 tablet 3  . Ascorbic Acid (VITAMIN C) 100 MG tablet Take 100 mg by mouth daily.    . Celecoxib (CELEBREX PO) Take by mouth every other day.     . gabapentin (NEURONTIN) 400 MG capsule Take 1 capsule (400 mg total) by mouth 2 (two) times daily. 60 capsule 2  . LORazepam (ATIVAN) 0.5 MG tablet Take 1 tablet (0.5 mg total) by mouth 2 (two) times daily as needed (for refractory nausea and vomiting). 30 tablet 0  . Multiple Vitamin (MULTIVITAMIN) tablet Take 1 tablet by mouth daily.    . ondansetron (ZOFRAN) 8 MG tablet Take 1 tablet (8 mg total) by mouth 2 (two) times daily as needed for refractory nausea / vomiting. Start on day 3 after cyclophosphamide chemotherapy. 30 tablet 1  . prochlorperazine (COMPAZINE) 10 MG tablet Take 1 tablet (10 mg total) by mouth every 6 (six) hours as needed (Nausea or vomiting). 30 tablet 6  . traMADol (ULTRAM) 50 MG tablet Take by mouth every 6 (six) hours as needed.    . traZODone (DESYREL) 50 MG tablet Take 1 tablet (50 mg total) by mouth at bedtime as needed for sleep. 30 tablet 5  . vitamin B-12 (CYANOCOBALAMIN) 100 MCG tablet Take 100 mcg by mouth daily.    [name] Kitchen acetaminophen (TYLENOL) 325 MG tablet Take 650 mg by mouth every 6  (six) hours as needed.    [name] Kitchen HYDROcodone-acetaminophen (NORCO) 7.5-325 MG tablet Take 1 tablet by mouth every 6 (six) hours as needed for moderate pain. (Patient not taking: Reported on 06/27/2019) 120 tablet 0  . lidocaine-prilocaine (EMLA) cream Apply to affected area once (Patient not taking: Reported on 06/27/2019) 30 g 3  . pantoprazole (PROTONIX) 20 MG tablet Take 1 tablet (20 mg total) by mouth daily. 30 tablet 5  . pegfilgrastim (NEULASTA ONPRO KIT) 6 MG/0.6ML injection Inject 6 mg into the skin once. Inject via provided programmed delivery device.    . predniSONE (DELTASONE) 20 MG tablet Take 20 mg by mouth daily. Take 5 tablets (146m) by mouth daily for 5 days. Take on days 1-5 of chemotherapy     No current facility-administered medications for this visit.    Facility-Administered Medications Ordered in Other Visits  Medication Dose Route Frequency Provider Last Rate Last Dose  . acetaminophen (TYLENOL) tablet 650 mg  650 mg Oral Once ZTish Men MD      . cyclophosphamide (CYTOXAN) 1,180 mg in sodium  chloride 0.9 % 250 mL chemo infusion  750 mg/m2 (Treatment Plan Recorded) Intravenous Once Tish Men, MD      . diphenhydrAMINE (BENADRYL) capsule 50 mg  50 mg Oral Once Tish Men, MD      . DOXOrubicin (ADRIAMYCIN) chemo injection 80 mg  50 mg/m2 (Treatment Plan Recorded) Intravenous Once Tish Men, MD      . fosaprepitant (EMEND) 150 mg, dexamethasone (DECADRON) 12 mg in sodium chloride 0.9 % 145 mL IVPB   Intravenous Once Tish Men, MD      . heparin lock flush 100 unit/mL  500 Units Intracatheter Once PRN Tish Men, MD      . palonosetron (ALOXI) injection 0.25 mg  0.25 mg Intravenous Once Tish Men, MD      . riTUXimab (RITUXAN) 600 mg in sodium chloride 0.9 % 190 mL infusion  375 mg/m2 (Treatment Plan Recorded) Intravenous Once Tish Men, MD      . sodium chloride flush (NS) 0.9 % injection 10 mL  10 mL Intracatheter PRN Tish Men, MD      . vinCRIStine (ONCOVIN) 2 mg in sodium  chloride 0.9 % 50 mL chemo infusion  2 mg Intravenous Once Tish Men, MD        PHYSICAL EXAMINATION: ECOG PERFORMANCE STATUS: 2 - Symptomatic, <50% confined to bed  Today's Vitals   06/27/19 1014 06/27/19 1019  BP: 116/73   Pulse: (!) 102   Resp: 18   Temp: 98.2 F (36.8 C)   TempSrc: Temporal   SpO2: 100%   Weight: 124 lb 9.6 oz (56.5 kg)   Height: '4\' 10"'  (1.473 m)   PainSc:  0-No pain   Body mass index is 26.04 kg/m.  Filed Weights   06/27/19 1014  Weight: 124 lb 9.6 oz (56.5 kg)    GENERAL: alert, no distress and comfortable SKIN: skin color, texture, turgor are normal, no rashes or significant lesions EYES: conjunctiva are pink and non-injected, sclera clear OROPHARYNX: no exudate, no erythema; lips, buccal mucosa, and tongue normal  NECK: supple, non-tender LYMPH:  Improving left supraclavicular adenopathy, minimally enlarged  LUNGS: clear to auscultation with normal breathing effort HEART: regular rate & rhythm and no murmurs and no lower extremity edema ABDOMEN: soft, non-tender, non-distended, normal bowel sounds Musculoskeletal: no cyanosis of digits and no clubbing  PSYCH: alert & oriented x 3, fluent speech NEURO: no focal motor/sensory deficits  LABORATORY DATA:  I have reviewed the data as listed    Component Value Date/Time   NA 139 06/27/2019 0954   NA 137 01/16/2015   K 4.3 06/27/2019 0954   CL 105 06/27/2019 0954   CO2 24 06/27/2019 0954   GLUCOSE 99 06/27/2019 0954   BUN 11 06/27/2019 0954   BUN 12 01/16/2015   CREATININE 0.81 06/27/2019 0954   CALCIUM 9.2 06/27/2019 0954   PROT 6.5 06/27/2019 0954   ALBUMIN 3.6 06/27/2019 0954   AST 20 06/27/2019 0954   ALT 17 06/27/2019 0954   ALKPHOS 52 06/27/2019 0954   BILITOT 0.2 (L) 06/27/2019 0954   GFRNONAA >60 06/27/2019 0954   GFRAA >60 06/27/2019 0954    No results found for: SPEP, UPEP  Lab Results  Component Value Date   WBC 5.2 06/27/2019   NEUTROABS 3.0 06/27/2019   HGB 11.0 (L)  06/27/2019   HCT 34.2 (L) 06/27/2019   MCV 86.1 06/27/2019   PLT 463 (H) 06/27/2019      Chemistry      Component Value Date/Time  NA 139 06/27/2019 0954   NA 137 01/16/2015   K 4.3 06/27/2019 0954   CL 105 06/27/2019 0954   CO2 24 06/27/2019 0954   BUN 11 06/27/2019 0954   BUN 12 01/16/2015   CREATININE 0.81 06/27/2019 0954   GLU 96 01/16/2015      Component Value Date/Time   CALCIUM 9.2 06/27/2019 0954   ALKPHOS 52 06/27/2019 0954   AST 20 06/27/2019 0954   ALT 17 06/27/2019 0954   BILITOT 0.2 (L) 06/27/2019 0954       RADIOGRAPHIC STUDIES: I have personally reviewed the radiological images as listed below and agreed with the findings in the report. Nm Pet Image Restag (ps) Skull Base To Thigh  Result Date: 06/12/2019 CLINICAL DATA:  Subsequent treatment strategy for diffuse large B-cell lymphoma status post 3 cycles first-line chemotherapy. EXAM: NUCLEAR MEDICINE PET SKULL BASE TO THIGH TECHNIQUE: 6.3 mCi F-18 FDG was injected intravenously. Full-ring PET imaging was performed from the skull base to thigh after the radiotracer. CT data was obtained and used for attenuation correction and anatomic localization. Fasting blood glucose: 104 mg/dl COMPARISON:  03/29/2019 PET-CT. FINDINGS: Mediastinal blood pool activity: SUV max 2.2 Liver activity: SUV max 3.3 NECK: Dominant left supraclavicular nodal mass is decreased in size and metabolism, measuring 3.8 x 3.6 cm with max SUV 17.2 (series 4/image 37), previously 7.4 x 5.6 cm with max SUV 29.3. no additional enlarged or hypermetabolic lymph nodes in the neck. Asymmetric right glottic uptake without mass correlate on the CT images, cannot exclude left vocal cord paralysis. Incidental CT findings: none CHEST: No enlarged or hypermetabolic axillary, mediastinal or hilar lymph nodes. Previously visualized hypermetabolic left retropectoral lymph nodes have resolved. No hypermetabolic pulmonary findings. Incidental CT findings: Right  internal jugular Port-A-Cath terminates at the cavoatrial junction. Coronary atherosclerosis. Atherosclerotic nonaneurysmal thoracic aorta. No acute consolidative airspace disease or lung masses. Stable 4 mm posterior left lower lobe pulmonary nodule (series 8/image 41), below PET resolution. No new significant pulmonary nodules. ABDOMEN/PELVIS: No enlarged or hypermetabolic lymph nodes in the abdomen or pelvis. Previously visualized hypermetabolic left para-aortic, aortocaval and lower mesenteric lymph nodes are all now subcentimeter in size and demonstrate no appreciable uptake. Spleen is normal in size and stable in size. There is low level splenic hypermetabolism with max SUV 3.7 in the spleen (slightly above the background liver activity), stable from prior PET-CT. No focal splenic mass. No abnormal hypermetabolic activity within the liver, pancreas or adrenal glands. Incidental CT findings: Atherosclerotic nonaneurysmal abdominal aorta. Moderate sigmoid diverticulosis. Hysterectomy. SKELETON: No focal hypermetabolic activity to suggest skeletal metastasis. Low level hypermetabolism throughout the skeleton is compatible with mildly stimulated marrow state. Incidental CT findings: none IMPRESSION: 1. Partial treatment response. Dominant left supraclavicular nodal mass is decreased in size and metabolism, with persistent Deauville score 5 uptake. Previously visualized hypermetabolic left retropectoral, retroperitoneal and lower mesenteric lymph nodes have resolved. 2. Stable low level splenic hypermetabolism, nonspecific, possibly reactive. Normal size spleen. No splenic masses. 3. Low level hypermetabolism throughout the skeleton, compatible with mildly stimulated marrow state. 4. Asymmetric right glottic uptake without CT mass correlate, cannot exclude left focal cord paralysis. 5.  Aortic Atherosclerosis (ICD10-I70.0). Electronically Signed   By: Ilona Sorrel M.D.   On: 06/12/2019 17:43

## 2019-06-28 ENCOUNTER — Telehealth: Payer: Self-pay | Admitting: *Deleted

## 2019-06-28 NOTE — Telephone Encounter (Signed)
Message received from [place] with [facility] stating that the patient called their office stating that she is having increased shortness of breath since her chemo treatment on 06/27/19 and asked Sandy to call Dr. Lorette Ang office regarding shortness of breath.  Call placed to patient.  Patient states that she has been short of breath since August.  When asked if her shortness of breath is worse from her visit with Dr. Maylon Peppers yesterday, patient states "no."  Pt states that she does not feel in distress and does not need to go to the ED at this time. She denies any fever, chills, cough or pain.  Pt states that she does have a history of asthma and has requested a refill of her inhaler from her PCP with earlier phone call to that office.  Pt instructed to take a Lorazepam now as prescribed, to use inhaler when obtained and to call office back is SOB worsens.  Pt appreciative of call and has no further questions or concerns at this time.

## 2019-06-28 NOTE — Telephone Encounter (Signed)
Oncology Nurse Navigator Documentation  Ms. Sandra Payne called re tmt plan and new pantoprazole Rx per yesterday's visit with Dr. Maylon Peppers.  I explained plan is for RT after 2 more cycles of RCHOP, answered her questions re RT tmt and SEs.  Explained she should take pantoprazole daily as directed, not PRN.  She voiced understanding of information provided.  [name], RN, BSN Head & Neck Oncology Nurse [place] at [place] [phone]

## 2019-06-29 ENCOUNTER — Inpatient Hospital Stay: Payer: PPO

## 2019-06-29 ENCOUNTER — Other Ambulatory Visit: Payer: Self-pay

## 2019-06-29 VITALS — BP 123/68 | HR 108 | Temp 98.5°F | Resp 20

## 2019-06-29 DIAGNOSIS — C8331 Diffuse large B-cell lymphoma, lymph nodes of head, face, and neck: Secondary | ICD-10-CM

## 2019-06-29 DIAGNOSIS — Z5189 Encounter for other specified aftercare: Secondary | ICD-10-CM | POA: Diagnosis not present

## 2019-06-29 MED ORDER — PEGFILGRASTIM-JMDB 6 MG/0.6ML ~~LOC~~ SOSY
6.0000 mg | PREFILLED_SYRINGE | Freq: Once | SUBCUTANEOUS | Status: AC
  Administered 2019-06-29: 6 mg via SUBCUTANEOUS

## 2019-06-29 MED ORDER — PEGFILGRASTIM-JMDB 6 MG/0.6ML ~~LOC~~ SOSY
PREFILLED_SYRINGE | SUBCUTANEOUS | Status: AC
  Filled 2019-06-29: qty 0.6

## 2019-06-29 NOTE — Patient Instructions (Signed)

## 2019-07-02 ENCOUNTER — Telehealth: Payer: Self-pay | Admitting: *Deleted

## 2019-07-02 NOTE — Telephone Encounter (Signed)
Oncology Nurse Navigator Documentation  Rec'd call from Sandra Payne:  She reported new onset this weekend of mild sore throat, rated 5/10: " Probably got it because I was sitting on my porch  this weekend enjoying the warm weather", "feel it when I swallow, feels like its moving to the other side".  She noted she is gargling with warm saltwater rinses which has brought some relief but she is concerned it may worsen and interfere with her ability to eat which presently is not the case.    She requested an antibiotic which she said she received several months ago for similar complaint.  I encouraged her to continue with saltwater rinses, indicated I would inform Dr. Maylon Peppers.  She voiced understanding.  [name], RN, BSN Head & Neck Oncology Nurse [place] at [place] [phone]

## 2019-07-04 ENCOUNTER — Emergency Department (HOSPITAL_COMMUNITY): Payer: PPO

## 2019-07-04 ENCOUNTER — Other Ambulatory Visit: Payer: Self-pay | Admitting: Oncology

## 2019-07-04 ENCOUNTER — Emergency Department (HOSPITAL_COMMUNITY)
Admission: EM | Admit: 2019-07-04 | Discharge: 2019-07-04 | Disposition: A | Discharge status: Home / Self Care | Payer: PPO | Attending: Emergency Medicine | Admitting: Emergency Medicine

## 2019-07-04 ENCOUNTER — Other Ambulatory Visit: Payer: Self-pay

## 2019-07-04 ENCOUNTER — Encounter (HOSPITAL_COMMUNITY): Payer: Self-pay

## 2019-07-04 DIAGNOSIS — D696 Thrombocytopenia, unspecified: Secondary | ICD-10-CM | POA: Diagnosis not present

## 2019-07-04 DIAGNOSIS — Z96651 Presence of right artificial knee joint: Secondary | ICD-10-CM | POA: Insufficient documentation

## 2019-07-04 DIAGNOSIS — R531 Weakness: Secondary | ICD-10-CM | POA: Diagnosis not present

## 2019-07-04 DIAGNOSIS — K5900 Constipation, unspecified: Secondary | ICD-10-CM | POA: Diagnosis not present

## 2019-07-04 DIAGNOSIS — Z79899 Other long term (current) drug therapy: Secondary | ICD-10-CM | POA: Insufficient documentation

## 2019-07-04 DIAGNOSIS — Z87891 Personal history of nicotine dependence: Secondary | ICD-10-CM | POA: Insufficient documentation

## 2019-07-04 DIAGNOSIS — R110 Nausea: Secondary | ICD-10-CM | POA: Diagnosis present

## 2019-07-04 DIAGNOSIS — E860 Dehydration: Secondary | ICD-10-CM | POA: Diagnosis not present

## 2019-07-04 DIAGNOSIS — Z8572 Personal history of non-Hodgkin lymphomas: Secondary | ICD-10-CM | POA: Insufficient documentation

## 2019-07-04 DIAGNOSIS — R000 Tachycardia, unspecified: Secondary | ICD-10-CM | POA: Diagnosis not present

## 2019-07-04 DIAGNOSIS — R630 Anorexia: Secondary | ICD-10-CM | POA: Insufficient documentation

## 2019-07-04 DIAGNOSIS — Z9221 Personal history of antineoplastic chemotherapy: Secondary | ICD-10-CM | POA: Insufficient documentation

## 2019-07-04 LAB — COMPREHENSIVE METABOLIC PANEL
ALT: 13 U/L (ref 0–44)
AST: 14 U/L — ABNORMAL LOW (ref 15–41)
Albumin: 3.5 g/dL (ref 3.5–5.0)
Alkaline Phosphatase: 51 U/L (ref 38–126)
Anion gap: 8 (ref 5–15)
BUN: 11 mg/dL (ref 8–23)
CO2: 27 mmol/L (ref 22–32)
Calcium: 8.8 mg/dL — ABNORMAL LOW (ref 8.9–10.3)
Chloride: 103 mmol/L (ref 98–111)
Creatinine, Ser: 0.51 mg/dL (ref 0.44–1.00)
GFR calc Af Amer: >60 mL/min (ref >60)
GFR calc non Af Amer: >60 mL/min (ref >60)
Glucose, Bld: 113 mg/dL — ABNORMAL HIGH (ref 70–99)
Potassium: 3.5 mmol/L (ref 3.5–5.1)
Sodium: 138 mmol/L (ref 135–145)
Total Bilirubin: 1.1 mg/dL (ref 0.3–1.2)
Total Protein: 6.1 g/dL — ABNORMAL LOW (ref 6.5–8.1)

## 2019-07-04 LAB — CBC WITH DIFFERENTIAL/PLATELET
Abs Immature Granulocytes: 0 10*3/uL (ref 0.00–0.07)
Basophils Absolute: 0 10*3/uL (ref 0.0–0.1)
Basophils Relative: 2 %
Eosinophils Absolute: 0 10*3/uL (ref 0.0–0.5)
Eosinophils Relative: 4 %
HCT: 31.2 % — ABNORMAL LOW (ref 36.0–46.0)
Hemoglobin: 10.2 g/dL — ABNORMAL LOW (ref 12.0–15.0)
Immature Granulocytes: 0 %
Lymphocytes Relative: 18 %
Lymphs Abs: 0.1 10*3/uL — ABNORMAL LOW (ref 0.7–4.0)
MCH: 29.1 pg (ref 26.0–34.0)
MCHC: 32.7 g/dL (ref 30.0–36.0)
MCV: 88.9 fL (ref 80.0–100.0)
Monocytes Absolute: 0.1 10*3/uL (ref 0.1–1.0)
Monocytes Relative: 16 %
Neutro Abs: 0.3 10*3/uL — ABNORMAL LOW (ref 1.7–7.7)
Neutrophils Relative %: 60 %
Platelets: 10 10*3/uL — CL (ref 150–400)
RBC: 3.51 MIL/uL — ABNORMAL LOW (ref 3.87–5.11)
RDW: 20.4 % — ABNORMAL HIGH (ref 11.5–15.5)
WBC: 0.5 10*3/uL — CL (ref 4.0–10.5)
nRBC: 0 % (ref 0.0–0.2)

## 2019-07-04 LAB — MAGNESIUM: Magnesium: 2.2 mg/dL (ref 1.7–2.4)

## 2019-07-04 LAB — URINALYSIS, MICROSCOPIC (REFLEX)

## 2019-07-04 LAB — LIPASE, BLOOD: Lipase: 19 U/L (ref 11–51)

## 2019-07-04 LAB — URINALYSIS, ROUTINE W REFLEX MICROSCOPIC
Bilirubin Urine: NEGATIVE
Glucose, UA: NEGATIVE mg/dL
Hgb urine dipstick: NEGATIVE
Ketones, ur: NEGATIVE mg/dL
Nitrite: NEGATIVE
Protein, ur: NEGATIVE mg/dL
Specific Gravity, Urine: 1.01 (ref 1.005–1.030)
pH: 8.5 — ABNORMAL HIGH (ref 5.0–8.0)

## 2019-07-04 LAB — TYPE AND SCREEN
ABO/RH(D): O NEG
Antibody Screen: NEGATIVE

## 2019-07-04 MED ORDER — METOCLOPRAMIDE HCL 5 MG/ML IJ SOLN
10.0000 mg | Freq: Once | INTRAMUSCULAR | Status: AC
  Administered 2019-07-04: 08:00:00 10 mg via INTRAVENOUS
  Filled 2019-07-04: qty 2

## 2019-07-04 MED ORDER — SODIUM CHLORIDE 0.9 % IV SOLN
2.0000 g | Freq: Once | INTRAVENOUS | Status: AC
  Administered 2019-07-04: 09:00:00 2 g via INTRAVENOUS
  Filled 2019-07-04: qty 2

## 2019-07-04 MED ORDER — METOCLOPRAMIDE HCL 10 MG PO TABS: 10.0000 mg | ORAL_TABLET | Freq: Four times a day (QID) | ORAL | 0 refills | Status: DC

## 2019-07-04 MED ORDER — PANTOPRAZOLE SODIUM 20 MG PO TBEC
20.0000 mg | DELAYED_RELEASE_TABLET | Freq: Every day | ORAL | Status: DC
  Administered 2019-07-04: 16:00:00 20 mg via ORAL
  Filled 2019-07-04: qty 1

## 2019-07-04 MED ORDER — LACTATED RINGERS IV BOLUS
1000.0000 mL | Freq: Once | INTRAVENOUS | Status: AC
  Administered 2019-07-04: 1000 mL via INTRAVENOUS

## 2019-07-04 MED ORDER — SODIUM CHLORIDE 0.9 % IV SOLN
10.0000 mL/h | Freq: Once | INTRAVENOUS | Status: AC
  Administered 2019-07-04: 12:00:00 10 mL/h via INTRAVENOUS

## 2019-07-04 MED ORDER — CIPROFLOXACIN HCL 500 MG PO TABS: 500.0000 mg | ORAL_TABLET | Freq: Two times a day (BID) | ORAL | 0 refills | Status: DC

## 2019-07-04 MED ORDER — HEPARIN SOD (PORK) LOCK FLUSH 100 UNIT/ML IV SOLN
500.0000 [IU] | Freq: Once | INTRAVENOUS | Status: AC
  Administered 2019-07-04: 16:00:00 500 [IU]
  Filled 2019-07-04: qty 5

## 2019-07-04 MED ORDER — ALLOPURINOL 300 MG PO TABS
300.0000 mg | ORAL_TABLET | Freq: Every day | ORAL | Status: DC
  Administered 2019-07-04: 300 mg via ORAL
  Filled 2019-07-04: qty 1

## 2019-07-04 NOTE — ED Provider Notes (Addendum)
[facility] DEPT Provider Note   CSN: [id number] Arrival date & time: 07/04/19  0704     History   Chief Complaint No chief complaint on file.   HPI Sandra Payne is a 78 y.o. female.     Patient is a 78 year old female with a history of non-Hodgkin lymphoma who is currently on chemotherapy every 3 weeks presenting today with nausea, decreased appetite and no bowel movement for the last 5 days.  She feels generally ill but denies any localized pain, vomiting, fever, shortness of breath or cough.  Patient was having some sore throat but that is resolved over the last 2 to 3 days.  She has no mouth pain.  She denies any dysuria but has been urinating frequently.  She has had no recent medication changes.  The history is provided by the patient.    Past Medical History:  Diagnosis Date   Anemia    Arthralgia    Arthritis    Elevated cholesterol    Insomnia    Lymphoma ([place])    Neck mass    left    Patient Active Problem List   Diagnosis Date Noted   Anemia due to antineoplastic chemotherapy 05/16/2019   Chemotherapy-induced nausea 05/16/2019   Thrombocytosis ([place]) 03/21/2019   DLBCL (diffuse large B cell lymphoma) ([place]) 03/19/2019   Arthritis of knee 01/06/2015   Primary osteoarthritis of right knee 01/05/2015   Abnormality of gait 12/03/2014   Benign paroxysmal positional vertigo 12/03/2014   Paresthesias 12/03/2014    Past Surgical History:  Procedure Laterality Date   ABDOMINAL HYSTERECTOMY     partial   IR IMAGING GUIDED PORT INSERTION  03/28/2019   MASS BIOPSY Left 03/12/2019   Procedure: NECK MASS BIOPSY;  Surgeon: Izora Gala, MD;  Location: [facility];  Service: ENT;  Laterality: Left;   TOTAL KNEE ARTHROPLASTY Right 01/06/2015   Procedure: RIGHT TOTAL KNEE ARTHROPLASTY;  Surgeon: Frederik Pear, MD;  Location: [facility];  Service: Orthopedics;  Laterality: Right;     OB History   No obstetric  history on file.      Home Medications    Prior to Admission medications   Medication Sig Start Date End Date Taking? Authorizing Provider  acetaminophen (TYLENOL) 325 MG tablet Take 650 mg by mouth every 6 (six) hours as needed.    [provider]  allopurinol (ZYLOPRIM) 300 MG tablet Take 1 tablet (300 mg total) by mouth daily. 03/28/19   Tish Men, MD  Ascorbic Acid (VITAMIN C) 100 MG tablet Take 100 mg by mouth daily.    [provider]  Celecoxib (CELEBREX PO) Take by mouth every other day.     [provider]  gabapentin (NEURONTIN) 400 MG capsule Take 1 capsule (400 mg total) by mouth 2 (two) times daily. 06/15/19 07/15/19  Tish Men, MD  HYDROcodone-acetaminophen (NORCO) 7.5-325 MG tablet Take 1 tablet by mouth every 6 (six) hours as needed for moderate pain. Patient not taking: Reported on 06/27/2019 04/13/19   Eliezer Bottom, NP  lidocaine-prilocaine (EMLA) cream Apply to affected area once Patient not taking: Reported on 06/27/2019 03/28/19   Tish Men, MD  LORazepam (ATIVAN) 0.5 MG tablet Take 1 tablet (0.5 mg total) by mouth 2 (two) times daily as needed (for refractory nausea and vomiting). 05/16/19   Tish Men, MD  Multiple Vitamin (MULTIVITAMIN) tablet Take 1 tablet by mouth daily.    [provider]  ondansetron (ZOFRAN) 8 MG tablet  Take 1 tablet (8 mg total) by mouth 2 (two) times daily as needed for refractory nausea / vomiting. Start on day 3 after cyclophosphamide chemotherapy. 03/28/19   Tish Men, MD  pantoprazole (PROTONIX) 20 MG tablet Take 1 tablet (20 mg total) by mouth daily. 06/27/19 07/27/19  Tish Men, MD  pegfilgrastim (NEULASTA ONPRO KIT) 6 MG/0.6ML injection Inject 6 mg into the skin once. Inject via provided programmed delivery device.    [provider]  predniSONE (DELTASONE) 20 MG tablet Take 20 mg by mouth daily. Take 5 tablets (184m) by mouth daily for 5 days. Take on days 1-5 of chemotherapy    ZTish Men MD    prochlorperazine (COMPAZINE) 10 MG tablet Take 1 tablet (10 mg total) by mouth every 6 (six) hours as needed (Nausea or vomiting). 03/28/19   ZTish Men MD  traMADol (Veatrice Bourbon 50 MG tablet Take by mouth every 6 (six) hours as needed.    [provider]  traZODone (DESYREL) 50 MG tablet Take 1 tablet (50 mg total) by mouth at bedtime as needed for sleep. 04/25/19 06/27/19  ZTish Men MD  vitamin B-12 (CYANOCOBALAMIN) 100 MCG tablet Take 100 mcg by mouth daily.    [provider]    Family History Family History  Problem Relation Age of Onset   Diabetes Mother    Hypertension Mother    Breast cancer Neg Hx     Social History Social History   Tobacco Use   Smoking status: Former Smoker    Packs/day: 0.25    Years: 2.00    Pack years: 0.50    Types: Cigarettes   Smokeless tobacco: Never Used  Substance Use Topics   Alcohol use: Yes    Alcohol/week: 0.0 standard drinks    Comment: occasionally   Drug use: No     Allergies   Codeine and Sulfa antibiotics   Review of Systems Review of Systems  All other systems reviewed and are negative.    Physical Exam Updated Vital Signs BP 138/83    Pulse (!) 133    Temp 98.1 F (36.7 C)    Resp 16    Ht '4\' 10"'  (1.473 m)    Wt 55.3 kg    SpO2 100%    BMI 25.50 kg/m   Physical Exam Vitals signs and nursing note reviewed.  Constitutional:      General: She is not in acute distress.    Appearance: Normal appearance. She is well-developed and normal weight.  HENT:     Head: Normocephalic and atraumatic.     Mouth/Throat:     Mouth: Mucous membranes are dry.     Comments: No plaques or lesions seen in the mouth Eyes:     Pupils: Pupils are equal, round, and reactive to light.  Cardiovascular:     Rate and Rhythm: Regular rhythm. Tachycardia present.     Pulses: Normal pulses.     Heart sounds: Normal heart sounds. No murmur. No friction rub.  Pulmonary:     Effort: Pulmonary effort is normal.     Breath  sounds: Normal breath sounds. No wheezing or rales.  Abdominal:     General: Bowel sounds are normal. There is no distension.     Palpations: Abdomen is soft.     Tenderness: There is no abdominal tenderness. There is no guarding or rebound.  Musculoskeletal: Normal range of motion.        General: No tenderness.     Right lower leg:  No edema.     Left lower leg: No edema.     Comments: No edema  Skin:    General: Skin is warm and dry.     Findings: No rash.  Neurological:     General: No focal deficit present.     Mental Status: She is alert and oriented to person, place, and time. Mental status is at baseline.     Cranial Nerves: No cranial nerve deficit.  Psychiatric:        Mood and Affect: Mood normal.        Behavior: Behavior normal.        Thought Content: Thought content normal.      ED Treatments / Results  Labs (all labs ordered are listed, but only abnormal results are displayed) Labs Reviewed  CBC WITH DIFFERENTIAL/PLATELET - Abnormal; Notable for the following components:      Result Value   WBC 0.5 (*)    RBC 3.51 (*)    Hemoglobin 10.2 (*)    HCT 31.2 (*)    RDW 20.4 (*)    Platelets 10 (*)    Neutro Abs 0.3 (*)    Lymphs Abs 0.1 (*)    All other components within normal limits  COMPREHENSIVE METABOLIC PANEL - Abnormal; Notable for the following components:   Glucose, Bld 113 (*)    Calcium 8.8 (*)    Total Protein 6.1 (*)    AST 14 (*)    All other components within normal limits  URINALYSIS, ROUTINE W REFLEX MICROSCOPIC - Abnormal; Notable for the following components:   pH 8.5 (*)    Leukocytes,Ua MODERATE (*)    All other components within normal limits  URINALYSIS, MICROSCOPIC (REFLEX) - Abnormal; Notable for the following components:   Bacteria, UA FEW (*)    All other components within normal limits  SARS CORONAVIRUS 2 (TAT 6-24 HRS)  MAGNESIUM  LIPASE, BLOOD  PREPARE PLATELET PHERESIS    EKG EKG  Interpretation  Date/Time:  Wednesday July 04 2019 08:07:05 EST Ventricular Rate:  104 PR Interval:    QRS Duration: 76 QT Interval:  360 QTC Calculation: 474 R Axis:   5 Text Interpretation: Sinus tachycardia No significant change since last tracing Confirmed by Blanchie Dessert [phone]) on 07/04/2019 8:17:28 AM   Radiology Dg Abdomen 1 View  Result Date: 07/04/2019 CLINICAL DATA:  Nausea and constipation EXAM: ABDOMEN - 1 VIEW COMPARISON:  05/04/2019 FINDINGS: The bowel gas pattern is normal. No radio-opaque calculi or other significant radiographic abnormality are seen. Mild-to-moderate stool in the colon. Degenerative changes in the lower lumbar spine IMPRESSION: Mild-to-moderate stool in the colon. Nonobstructive bowel gas pattern. Electronically Signed   By: Franchot Gallo M.D.   On: 07/04/2019 08:23    Procedures Procedures (including critical care time)  Medications Ordered in ED Medications  metoCLOPramide (REGLAN) injection 10 mg (has no administration in time range)  lactated ringers bolus 1,000 mL (has no administration in time range)     Initial Impression / Assessment and Plan / ED Course  I have reviewed the triage vital signs and the nursing notes.  Pertinent labs & imaging results that were available during my care of the patient were reviewed by me and considered in my medical decision making (see chart for details).        Elderly female presenting today with poor p.o. intake due to nausea, no bowel movements in the last 5 days and feeling diffusely mildly weak and ill.  She  is well-appearing but tachycardic on exam.  She has had no fever or infectious symptoms.  Symptoms are thought to be related to chemotherapy that she is currently taking.  She has no localized abdominal tenderness concerning for obstruction, perforation, diverticulitis or pancreatitis.  Concern for dehydration. Labs, EKG and KUB pending.  Patient given fluids and Reglan.  8:49  AM Patient is found to be neutropenic today with a white count of 0.5 and absolute neutrophil count of 300.  Platelet count of 10.  Spoke with Dr. Jana Hakim with oncology who recommended transfusion.  Patient's calcium and magnesium without significant findings, EKG is unchanged other than sinus tachycardia.  X-ray just shows stool present.  Spoke with Dr. Jana Hakim again and he requested patient give a dose of cefepime and he is already sent a prescription for Cipro to her pharmacy.  He feels that if patient is feeling better she should be able to be discharged home.  Will discuss with the patient and reevaluate.  3:16 PM Persistent tachycardia here but pt states she is feeling better and once platelets are done she would like to go home.  Pt will pick up reglan and cipro from pharmacy and will f/u as planned or return if symptoms worsen.  CRITICAL CARE Performed by: Chesnie Capell Total critical care time: 30 minutes Critical care time was exclusive of separately billable procedures and treating other patients. Critical care was necessary to treat or prevent imminent or life-threatening deterioration. Critical care was time spent personally by me on the following activities: development of treatment plan with patient and/or surrogate as well as nursing, discussions with consultants, evaluation of patient's response to treatment, examination of patient, obtaining history from patient or surrogate, ordering and performing treatments and interventions, ordering and review of laboratory studies, ordering and review of radiographic studies, pulse oximetry and re-evaluation of patient's condition.   Final Clinical Impressions(s) / ED Diagnoses   Final diagnoses:  Thrombocytopenia [facility])  Dehydration    ED Discharge Orders    None       Blanchie Dessert, MD 07/04/19 [id number]    Blanchie Dessert, MD 07/05/19 2158

## 2019-07-04 NOTE — ED Notes (Signed)
Date and time results received: 07/04/19 08:36   Test: WBC Critical Value: 0.5 Test: platelet Value: 10,000  Name of Provider Notified: [place]

## 2019-07-04 NOTE — ED Triage Notes (Signed)
Pt c/o of constipation and dehydration.  Last BM was Friday (5 days ago).  Pt hx non-Hogdkins lymphoma.

## 2019-07-04 NOTE — Discharge Instructions (Signed)
You received

## 2019-07-05 ENCOUNTER — Telehealth: Payer: Self-pay | Admitting: Emergency Medicine

## 2019-07-05 ENCOUNTER — Other Ambulatory Visit: Payer: Self-pay | Admitting: Medical

## 2019-07-05 ENCOUNTER — Telehealth: Payer: Self-pay | Admitting: Medical

## 2019-07-05 DIAGNOSIS — C8330 Diffuse large B-cell lymphoma, unspecified site: Secondary | ICD-10-CM

## 2019-07-05 DIAGNOSIS — D701 Agranulocytosis secondary to cancer chemotherapy: Secondary | ICD-10-CM

## 2019-07-05 DIAGNOSIS — T451X5A Adverse effect of antineoplastic and immunosuppressive drugs, initial encounter: Secondary | ICD-10-CM

## 2019-07-05 LAB — PREPARE PLATELET PHERESIS: Unit division: 0

## 2019-07-05 LAB — BPAM PLATELET PHERESIS
Blood Product Expiration Date: 202011132359
ISSUE DATE / TIME: 202011111318
Unit Type and Rh: 8400

## 2019-07-05 LAB — ABO/RH: ABO/RH(D): O NEG

## 2019-07-05 NOTE — Telephone Encounter (Signed)
Scheduled appt per 11/12 sch message - pt is aware of appt date and time.  

## 2019-07-05 NOTE — Progress Notes (Signed)
Lymphoma Location(s) / Histology:  03/12/19 Diagnosis Soft tissue mass, simple excision, Left Neck - DIFFUSE LARGE B-CELL LYMPHOMA  Sandra Payne presented with symptoms of: A sudden large lower left neck lump several days before she presented to Dr. Constance Holster on 02/20/19.  Biopsies of left neck revealed: Diffuse Large B- cell lymphoma.   Past/Anticipated interventions by medical oncology, if any:  06/27/19 Dr. 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  In addition, there was resolution of other hypermetabolic disease, including the retropectoral, retroperitoneal, and mesenteric LN's.  -I reviewed the imaging results in detail with the patient -The case was also discussed extensively at the head and neck tumor board.  The consensus was to proceed with 2 additional cycles of chemotherapy, followed by ISRT to the left supraclavicular nodal disease. -Labs adequate today, proceed with Cycle 5 of chemotherapy -We will repeat PET at the end of chemotherapy to assess any interval changes before transitioning to ISRT  -I have also placed referral to radiation oncology in anticipation of ISRT  -PRN anti-emetics: Zofran, Compazine, and Ativan    Weight changes, if any, over the past 6 months: She reports losing more than 12 lbs since starting chemotherapy.   Recurrent fevers, or drenching night sweats, if any: She reports occasional night sweats.   SAFETY ISSUES:  Prior radiation? No  Pacemaker/ICD? No  Possible current pregnancy? No  Is the patient on methotrexate? No  Current Complaints / other details:   She reports chewing food well, but feeling nervous to swallow  food.

## 2019-07-05 NOTE — Telephone Encounter (Signed)
Pt called requesting that [facility] appt for tomorrow be moved to a telephone visit.  Spoke with PA Lucianne Lei who was requested by MD Maylon Peppers to f/u with patient after her ED visit, particularly d/t her abnormal labs from that visit.  PA Lucianne Lei advised that pt should be seen in person tomorrow w/labs drawn as well.  Pt made aware of advice but states she is unsure if she will come tomorrow.  Pt agreed to speak with [facility] RN tomorrow (07/06/2019) around 10 am to check back in on how she is feeling and to determine if she will be coming in for [facility] appt.  Pt aware to call back as needed before then with any worsening issues, has no acute concerns or symptoms at this time.

## 2019-07-06 ENCOUNTER — Inpatient Hospital Stay: Payer: PPO | Admitting: Medical

## 2019-07-06 ENCOUNTER — Inpatient Hospital Stay: Payer: PPO

## 2019-07-06 ENCOUNTER — Telehealth: Payer: Self-pay | Admitting: Emergency Medicine

## 2019-07-06 NOTE — Telephone Encounter (Signed)
Called pt regarding appt for [facility]/labs today for f/u after recent ED visit.  Pt states she still would prefer not to come d/t fatigue and 'not feeling like driving', but denies any acute issues or the need to visit the ED at this time.  Pt reports that she would rather come in Monday (07/09/2019) at 1:30 for labs & [facility] appt, appts changed per pt request.  Pt states that she has not been taking cipro or reglan sent in to pharmacy by ED physician because she is afraid that it will inhibit her ability to drive/get around her house.  Pt reassured while the reglan can cause some drowsiness she is not required to take it unless she feels nauseated, at which point she likely does not need to be driving or can have someone else drive her to any needed destination.  Pt also reports that she has other nausea medications that she can take in case of continued uncontrolled nausea instead of the reglan if she continues to be concerned about its effects.  Pt received a dose of IV reglan during ED visit.  Pt instructed to begin taking oral abx as prescribed by ED physician as her WBC count was extremely low during this visit and puts her at risk for infection.  Pt received cefepime IV in the ED without any reported issues.  Pt encouraged to take all of her oral abx until she is finished.  Pt advised that abx are not likely to cause extreme fatigue/drowsiness but that the symptoms of fatigue that she currently has may improve with abx/antiemetic use.  Pt verbalized understanding of instructions and states she will start taking the oral abx today, as well as taking the reglan as needed.  Pt denies any further questions or concerns at this time, verbalizes understanding to call back as needed for any changes or concerns before she is seen on Monday and to visit the ED as needed during the weekend.  PA Lucianne Lei made aware, message also routed to MD Maylon Peppers.

## 2019-07-06 NOTE — Telephone Encounter (Signed)
Thank you.  Dr. Dicie Edelen  

## 2019-07-09 ENCOUNTER — Inpatient Hospital Stay: Payer: PPO

## 2019-07-09 ENCOUNTER — Other Ambulatory Visit: Payer: Self-pay

## 2019-07-09 ENCOUNTER — Inpatient Hospital Stay (HOSPITAL_BASED_OUTPATIENT_CLINIC_OR_DEPARTMENT_OTHER): Payer: PPO | Admitting: Medical

## 2019-07-09 VITALS — BP 125/70 | HR 116 | Temp 98.0°F | Resp 17 | Ht <= 58 in | Wt 123.6 lb

## 2019-07-09 DIAGNOSIS — C8331 Diffuse large B-cell lymphoma, lymph nodes of head, face, and neck: Secondary | ICD-10-CM

## 2019-07-09 DIAGNOSIS — C8330 Diffuse large B-cell lymphoma, unspecified site: Secondary | ICD-10-CM | POA: Diagnosis not present

## 2019-07-09 DIAGNOSIS — Z5189 Encounter for other specified aftercare: Secondary | ICD-10-CM | POA: Diagnosis not present

## 2019-07-09 DIAGNOSIS — Z95828 Presence of other vascular implants and grafts: Secondary | ICD-10-CM | POA: Diagnosis not present

## 2019-07-09 LAB — CBC WITH DIFFERENTIAL (CANCER CENTER ONLY)
Abs Immature Granulocytes: 1.76 10*3/uL — ABNORMAL HIGH (ref 0.00–0.07)
Basophils Absolute: 0.1 10*3/uL (ref 0.0–0.1)
Basophils Relative: 0 %
Eosinophils Absolute: 0.1 10*3/uL (ref 0.0–0.5)
Eosinophils Relative: 0 %
HCT: 29.6 % — ABNORMAL LOW (ref 36.0–46.0)
Hemoglobin: 9.6 g/dL — ABNORMAL LOW (ref 12.0–15.0)
Immature Granulocytes: 12 %
Lymphocytes Relative: 5 %
Lymphs Abs: 0.7 10*3/uL (ref 0.7–4.0)
MCH: 28.5 pg (ref 26.0–34.0)
MCHC: 32.4 g/dL (ref 30.0–36.0)
MCV: 87.8 fL (ref 80.0–100.0)
Monocytes Absolute: 1.8 10*3/uL — ABNORMAL HIGH (ref 0.1–1.0)
Monocytes Relative: 12 %
Neutro Abs: 10 10*3/uL — ABNORMAL HIGH (ref 1.7–7.7)
Neutrophils Relative %: 71 %
Platelet Count: 98 10*3/uL — ABNORMAL LOW (ref 150–400)
RBC: 3.37 MIL/uL — ABNORMAL LOW (ref 3.87–5.11)
RDW: 21 % — ABNORMAL HIGH (ref 11.5–15.5)
WBC Count: 14.3 10*3/uL — ABNORMAL HIGH (ref 4.0–10.5)
nRBC: 3.3 % — ABNORMAL HIGH (ref 0.0–0.2)

## 2019-07-09 LAB — CMP (CANCER CENTER ONLY)
ALT: 15 U/L (ref 0–44)
AST: 19 U/L (ref 15–41)
Albumin: 3.8 g/dL (ref 3.5–5.0)
Alkaline Phosphatase: 82 U/L (ref 38–126)
Anion gap: 10 (ref 5–15)
BUN: 6 mg/dL — ABNORMAL LOW (ref 8–23)
CO2: 26 mmol/L (ref 22–32)
Calcium: 8.7 mg/dL — ABNORMAL LOW (ref 8.9–10.3)
Chloride: 105 mmol/L (ref 98–111)
Creatinine: 0.67 mg/dL (ref 0.44–1.00)
GFR, Est AFR Am: >60 mL/min (ref >60)
GFR, Estimated: >60 mL/min (ref >60)
Glucose, Bld: 95 mg/dL (ref 70–99)
Potassium: 3.3 mmol/L — ABNORMAL LOW (ref 3.5–5.1)
Sodium: 141 mmol/L (ref 135–145)
Total Bilirubin: <0.2 mg/dL — ABNORMAL LOW (ref 0.3–1.2)
Total Protein: 6.5 g/dL (ref 6.5–8.1)

## 2019-07-09 MED ORDER — HEPARIN SOD (PORK) LOCK FLUSH 100 UNIT/ML IV SOLN
500.0000 [IU] | Freq: Once | INTRAVENOUS | Status: AC
  Administered 2019-07-09: 500 [IU] via INTRAVENOUS
  Filled 2019-07-09: qty 5

## 2019-07-09 MED ORDER — POTASSIUM CHLORIDE CRYS ER 20 MEQ PO TBCR: 20.0000 meq | EXTENDED_RELEASE_TABLET | Freq: Every day | ORAL | 0 refills | Status: DC

## 2019-07-09 MED ORDER — SODIUM CHLORIDE 0.9% FLUSH
10.0000 mL | INTRAVENOUS | Status: DC | PRN
  Administered 2019-07-09: 10 mL via INTRAVENOUS
  Filled 2019-07-09: qty 10

## 2019-07-09 NOTE — Progress Notes (Signed)
Radiation Oncology         [phone] ________________________________  Initial outpatient Telephone Consultation; by telephone as patient was unable to access MyChart video during pandemic precautions  Name: Sandra Payne MRN: [id number]  Date: 07/10/2019  DOB: [birth year]  CC:Jonathon Jordan, MD  Tish Men, MD   REFERRING PHYSICIAN: Tish Men, MD  DIAGNOSIS:    ICD-10-CM   1. Diffuse large B-cell lymphoma of lymph nodes of neck (HCC)  C83.31 TSH    Ambulatory referral to Social Work  2. Payne of weight  R63.4 TSH   STAGE IIIA DLBCL  CHIEF COMPLAINT: Here to discuss management of lymphoma  HISTORY OF PRESENT ILLNESS::Sandra Payne is a 78 y.o. female who presented with left lower neck mass. Neck CT performed on 02/19/2019 showed: 7 cm left lower neck mass extending into the superior mediastinum; additional enlarged lymph nodes adjacent to the mass in the left supraclavicular/retroclavvicular and left subpectoral regions.  Subsequently, the patient saw Dr. Constance Holster who performed fine needle aspiration of the mass.  Biopsy of the left lower neck/supraclavicular mass on 02/21/2019 revealed: atypical lymphoid proliferation, suspicious for lymphoma; no material available for additional studies; no evidence of carcinoma.  She proceeded to excisional biopsy on 03/12/2019, with pathology revealing: diffuse large B-cell lymphoma, germinal center subtype. FISH study showed BCL2 positivity.  She was referred to Dr. Maylon Peppers on 03/21/2019, who ordered PET scan and bone marrow biopsy, and recommended chemotherapy. PET scan performed on 03/29/2019 showed, aside from the known left supraclavicular mass: nearby FDG-acid left supraclavicular, left axillary, and left retroperitoneal lymph nodes; hypermetabolic mesenteric and iliac adenopathy.  Bone marrow biopsy was performed on 04/03/2019 and showed no evidence of lymphoma in the bone marrow biopsy, peripheral blood pathology, and bone marrow cytology.  She  is being treated with R-CHOP chemotherapy under Dr. Maylon Peppers. She is s/p 5 cycles, with her final cycle is scheduled for 07/18/2019.  Pertinent imaging also includes repeat PET scan, performed on 06/12/2019, s/p 3 cycles of chemotherapy, revealing partial treatment response-- dominant mass decreased in size and metabolism, previously visualized hypermetabolic left retropectoral, retroperitoneal, and lower mesenteric lymph nodes resolved; stable, nonspecific, low-level splenic hypermetabolism; low-level hypermetabolism throughout the skeleton, compatible with mildly stimulated marrow state; asymmetric right glottic uptake without CT mass correlate.   She was discussed at tumor board with the consensus to continue chemotherapy after the scan.  No night sweats, fevers, or excessive weight Payne before diagnosis.  Swallowing issues, if any: no  Weight Changes: 10-12 lb weight Payne since 02/2019.  Pain status: none  Other symptoms: nausea; eats small amounts at a time.  Tobacco history, if any: former smoker, smoked 1/4 ppd for 2 years.  ETOH abuse, if any: none, occasional alcohol use  Prior cancers, if any: none  She lives alone [place]. Next week is last cycle of chemotherapy.   PREVIOUS RADIATION THERAPY: No  PAST MEDICAL HISTORY:  has a past medical history of Anemia, Arthralgia, Arthritis, Elevated cholesterol, Insomnia, Lymphoma ([place]), and Neck mass.    PAST SURGICAL HISTORY: Past Surgical History:  Procedure Laterality Date   ABDOMINAL HYSTERECTOMY     partial   IR IMAGING GUIDED PORT INSERTION  03/28/2019   MASS BIOPSY Left 03/12/2019   Procedure: NECK MASS BIOPSY;  Surgeon: Izora Gala, MD;  Location: [facility];  Service: ENT;  Laterality: Left;   TOTAL KNEE ARTHROPLASTY Right 01/06/2015   Procedure: RIGHT TOTAL KNEE ARTHROPLASTY;  Surgeon: Frederik Pear, MD;  Location: [facility];  Service: Orthopedics;  Laterality: Right;    FAMILY HISTORY: family history  includes Diabetes in her mother; Hypertension in her mother.  SOCIAL HISTORY:  reports that she has quit smoking. Her smoking use included cigarettes. She has a 0.50 pack-year smoking history. She has never used smokeless tobacco. She reports current alcohol use. She reports that she does not use drugs.  ALLERGIES: Codeine and Sulfa antibiotics  MEDICATIONS:  Current Outpatient Medications  Medication Sig Dispense Refill   acetaminophen (TYLENOL) 325 MG tablet Take 650 mg by mouth every 6 (six) hours as needed for mild pain or headache.      albuterol (VENTOLIN HFA) 108 (90 Base) MCG/ACT inhaler Inhale 2 puffs into the lungs every 6 (six) hours as needed.     allopurinol (ZYLOPRIM) 300 MG tablet Take 1 tablet (300 mg total) by mouth daily. 30 tablet 3   celecoxib (CELEBREX) 200 MG capsule Take 200 mg by mouth every other day.     ciprofloxacin (CIPRO) 500 MG tablet Take 1 tablet (500 mg total) by mouth 2 (two) times daily. 10 tablet 0   gabapentin (NEURONTIN) 400 MG capsule Take 1 capsule (400 mg total) by mouth 2 (two) times daily. 60 capsule 2   LORazepam (ATIVAN) 0.5 MG tablet Take 1 tablet (0.5 mg total) by mouth 2 (two) times daily as needed (for refractory nausea and vomiting). 30 tablet 0   metoCLOPramide (REGLAN) 10 MG tablet Take 1 tablet (10 mg total) by mouth every 6 (six) hours. 30 tablet 0   Multiple Vitamin (MULTIVITAMIN) tablet Take 1 tablet by mouth daily.     pantoprazole (PROTONIX) 20 MG tablet Take 1 tablet (20 mg total) by mouth daily. 30 tablet 5   pegfilgrastim (NEULASTA ONPRO KIT) 6 MG/0.6ML injection Inject 6 mg into the skin as directed. Inject via provided programmed delivery device.  Every 3 weeks     polyvinyl alcohol (LIQUIFILM TEARS) 1.4 % ophthalmic solution Place 1 drop into both eyes as needed for dry eyes (allergies).     potassium chloride SA (KLOR-CON) 20 MEQ tablet Take 1 tablet (20 mEq total) by mouth daily. (Patient taking differently: Take 20  mEq by mouth daily. Hasn't started it yet. She plans to pick it up today -- 07/10/19) 10 tablet 0   predniSONE (DELTASONE) 20 MG tablet Take 100 mg by mouth as directed. Take 5 tablets (179m) by mouth daily for 5 days. Take on days 1-5 of chemotherapy      traMADol (ULTRAM) 50 MG tablet Take 50 mg by mouth every 6 (six) hours as needed for moderate pain.      HYDROcodone-acetaminophen (NORCO) 7.5-325 MG tablet Take 1 tablet by mouth every 6 (six) hours as needed for moderate pain. (Patient not taking: Reported on 06/27/2019) 120 tablet 0   lidocaine-prilocaine (EMLA) cream Apply to affected area once (Patient not taking: Reported on 06/27/2019) 30 g 3   ondansetron (ZOFRAN) 8 MG tablet Take 1 tablet (8 mg total) by mouth 2 (two) times daily as needed for refractory nausea / vomiting. Start on day 3 after cyclophosphamide chemotherapy. (Patient not taking: Reported on 07/10/2019) 30 tablet 1   prochlorperazine (COMPAZINE) 10 MG tablet Take 1 tablet (10 mg total) by mouth every 6 (six) hours as needed (Nausea or vomiting). (Patient not taking: Reported on 07/10/2019) 30 tablet 6   traZODone (DESYREL) 50 MG tablet Take 1 tablet (50 mg total) by mouth at bedtime as needed for sleep. 30 tablet 5   No current  facility-administered medications for this encounter.    Facility-Administered Medications Ordered in Other Encounters  Medication Dose Route Frequency Provider Last Rate Last Dose   sodium chloride flush (NS) 0.9 % injection 10 mL  10 mL Intravenous PRN Tish Men, MD   10 mL at 07/09/19 1423    REVIEW OF SYSTEMS:  Notable for that above.   PHYSICAL EXAM:  vitals were not taken for this visit.   General: Alert and oriented, in no acute distress    LABORATORY DATA:  Lab Results  Component Value Date   WBC 14.3 (H) 07/09/2019   HGB 9.6 (L) 07/09/2019   HCT 29.6 (L) 07/09/2019   MCV 87.8 07/09/2019   PLT 98 (L) 07/09/2019   CMP     Component Value Date/Time   NA 141 07/09/2019  1341   NA 137 01/16/2015   K 3.3 (L) 07/09/2019 1341   CL 105 07/09/2019 1341   CO2 26 07/09/2019 1341   GLUCOSE 95 07/09/2019 1341   BUN 6 (L) 07/09/2019 1341   BUN 12 01/16/2015   CREATININE 0.67 07/09/2019 1341   CALCIUM 8.7 (L) 07/09/2019 1341   PROT 6.5 07/09/2019 1341   ALBUMIN 3.8 07/09/2019 1341   AST 19 07/09/2019 1341   ALT 15 07/09/2019 1341   ALKPHOS 82 07/09/2019 1341   BILITOT <0.2 (L) 07/09/2019 1341   GFRNONAA >60 07/09/2019 1341   GFRAA >60 07/09/2019 1341      No results found for: TSH   RADIOGRAPHY: Dg Abdomen 1 View  Result Date: 07/04/2019 CLINICAL DATA:  Nausea and constipation EXAM: ABDOMEN - 1 VIEW COMPARISON:  05/04/2019 FINDINGS: The bowel gas pattern is normal. No radio-opaque calculi or other significant radiographic abnormality are seen. Mild-to-moderate stool in the colon. Degenerative changes in the lower lumbar spine IMPRESSION: Mild-to-moderate stool in the colon. Nonobstructive bowel gas pattern. Electronically Signed   By: Franchot Gallo M.D.   On: 07/04/2019 08:23   Nm Pet Image Restag (ps) Skull Base To Thigh  Result Date: 06/12/2019 CLINICAL DATA:  Subsequent treatment strategy for diffuse large B-cell lymphoma status post 3 cycles first-line chemotherapy. EXAM: NUCLEAR MEDICINE PET SKULL BASE TO THIGH TECHNIQUE: 6.3 mCi F-18 FDG was injected intravenously. Full-ring PET imaging was performed from the skull base to thigh after the radiotracer. CT data was obtained and used for attenuation correction and anatomic localization. Fasting blood glucose: 104 mg/dl COMPARISON:  03/29/2019 PET-CT. FINDINGS: Mediastinal blood pool activity: SUV max 2.2 Liver activity: SUV max 3.3 NECK: Dominant left supraclavicular nodal mass is decreased in size and metabolism, measuring 3.8 x 3.6 cm with max SUV 17.2 (series 4/image 37), previously 7.4 x 5.6 cm with max SUV 29.3. no additional enlarged or hypermetabolic lymph nodes in the neck. Asymmetric right glottic  uptake without mass correlate on the CT images, cannot exclude left vocal cord paralysis. Incidental CT findings: none CHEST: No enlarged or hypermetabolic axillary, mediastinal or hilar lymph nodes. Previously visualized hypermetabolic left retropectoral lymph nodes have resolved. No hypermetabolic pulmonary findings. Incidental CT findings: Right internal jugular Port-A-Cath terminates at the cavoatrial junction. Coronary atherosclerosis. Atherosclerotic nonaneurysmal thoracic aorta. No acute consolidative airspace disease or lung masses. Stable 4 mm posterior left lower lobe pulmonary nodule (series 8/image 41), below PET resolution. No new significant pulmonary nodules. ABDOMEN/PELVIS: No enlarged or hypermetabolic lymph nodes in the abdomen or pelvis. Previously visualized hypermetabolic left para-aortic, aortocaval and lower mesenteric lymph nodes are all now subcentimeter in size and demonstrate no appreciable uptake. Spleen is  normal in size and stable in size. There is low level splenic hypermetabolism with max SUV 3.7 in the spleen (slightly above the background liver activity), stable from prior PET-CT. No focal splenic mass. No abnormal hypermetabolic activity within the liver, pancreas or adrenal glands. Incidental CT findings: Atherosclerotic nonaneurysmal abdominal aorta. Moderate sigmoid diverticulosis. Hysterectomy. SKELETON: No focal hypermetabolic activity to suggest skeletal metastasis. Low level hypermetabolism throughout the skeleton is compatible with mildly stimulated marrow state. Incidental CT findings: none IMPRESSION: 1. Partial treatment response. Dominant left supraclavicular nodal mass is decreased in size and metabolism, with persistent Deauville score 5 uptake. Previously visualized hypermetabolic left retropectoral, retroperitoneal and lower mesenteric lymph nodes have resolved. 2. Stable low level splenic hypermetabolism, nonspecific, possibly reactive. Normal size spleen. No  splenic masses. 3. Low level hypermetabolism throughout the skeleton, compatible with mildly stimulated marrow state. 4. Asymmetric right glottic uptake without CT mass correlate, cannot exclude left focal cord paralysis. 5.  Aortic Atherosclerosis (ICD10-I70.0). Electronically Signed   By: Ilona Sorrel M.D.   On: 06/12/2019 17:43      IMPRESSION/PLAN: This is a delightful patient with lymphoma; she is completing chemotherapy and will undergo 1 more scan in the form of a PET scan for restaging.  Afterwards, I recommend she undergo ISRT over approximately 4 weeks to the area where she presented with the dominant left neck mass.  We discussed the potential risks, benefits, and side effects of radiotherapy. We talked in detail about acute and late effects. We discussed that some of the most bothersome acute effects may be mucositis, dysgeusia, salivary changes, skin irritation, hair Payne, dehydration, weight Payne and fatigue. We talked about late effects which include but are not necessarily limited to dysphagia, hypothyroidism, pneumonitis. No guarantees of treatment were given. The patient is enthusiastic about proceeding with treatment. I look forward to participating in the patient's care.    Simulation (treatment planning) will take place upon release from medical oncology  We also discussed that the treatment of head and neck cancer is a multidisciplinary process to maximize treatment outcomes and quality of life. For this reason the following referrals have been or will be made:  Nutritionist for nutrition support during and after treatment. Social work for social support.  Baseline labs including TSH.  This encounter was provided by telemedicine platform by telephone as patient was unable to access MyChart video during pandemic precautions The patient has given verbal consent for this type of encounter and has been advised to only accept a meeting of this type in a secure network  environment. The time spent during this encounter was 21 minutes. The attendants for this meeting include Eppie Gibson  and Elissa Hefty. Also [name], RN, our Head and Neck Oncology Navigator During the encounter, Eppie Gibson was located at [facility] Radiation Oncology Department.  Sandra Payne was located at home.   __________________________________________   Eppie Gibson, MD   This document serves as a record of services personally performed by Eppie Gibson, MD. It was created on her behalf by Wilburn Mylar, a trained medical scribe. The creation of this record is based on the scribe's personal observations and the provider's statements to them. This document has been checked and approved by the attending provider.

## 2019-07-10 ENCOUNTER — Other Ambulatory Visit: Payer: Self-pay

## 2019-07-10 ENCOUNTER — Ambulatory Visit: Payer: PPO

## 2019-07-10 ENCOUNTER — Encounter: Payer: Self-pay | Admitting: *Deleted

## 2019-07-10 ENCOUNTER — Encounter: Payer: Self-pay | Admitting: Radiation Oncology

## 2019-07-10 ENCOUNTER — Ambulatory Visit
Admission: RE | Admit: 2019-07-10 | Discharge: 2019-07-10 | Disposition: A | Discharge status: Home / Self Care | Payer: PPO | Source: Ambulatory Visit | Attending: Radiation Oncology | Admitting: Radiation Oncology

## 2019-07-10 ENCOUNTER — Institutional Professional Consult (permissible substitution): Payer: PPO | Admitting: Radiation Oncology

## 2019-07-10 DIAGNOSIS — C8331 Diffuse large B-cell lymphoma, lymph nodes of head, face, and neck: Secondary | ICD-10-CM

## 2019-07-10 DIAGNOSIS — Z79899 Other long term (current) drug therapy: Secondary | ICD-10-CM | POA: Diagnosis not present

## 2019-07-10 DIAGNOSIS — R634 Abnormal weight loss: Secondary | ICD-10-CM | POA: Diagnosis not present

## 2019-07-11 NOTE — Progress Notes (Signed)
[facility] Progress Note   Sandra Payne [id number] September 12, 1940 78 y.o.  Sandra Payne is managed by Dr. 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 Sandra Payne is status post R-CHOP with Udenyca which was last dosed on 06/29/2019.  She is scheduled to follow-up with Dr. Maylon Peppers for consideration of her next cycle of chemotherapy on 07/18/2019.  She had been asked to come to the clinic on 07/06/2019 for follow-up of a hospital visit where she was noted to have neutropenia.  She was contacted that day and reported that she could not come in for labs today returned with a WBC of 14.3, ANC of 10.0 and a platelet count of 98.  Please see After Visit Summary for patient specific instructions.  Future Appointments  Date Time Provider [place]  07/18/2019  9:45 AM [facility] LAB 3 [facility] None  07/18/2019 10:00 AM [facility] [place] FLUSH [facility] None  07/18/2019 10:30 AM Tish Men, MD [facility] None  07/18/2019 11:00 AM [facility] INFUSION [facility] None  07/18/2019  1:45 PM Jennet Maduro, RD [facility] None  07/20/2019  9:15 AM [facility] FLUSH [facility] None    No orders of the defined types were placed in this encounter.      Subjective:   Patient ID:  Sandra Payne is a 78 y.o. (DOB 27-Mar-1941) female.  Chief Complaint: No chief complaint on file.   HPI Sandra Payne  Is a 78 y.o. female with a diagnosis of a stage III DLBCL with bulky cervical adenopathy. Ms. Sandra Payne is managed by Dr. Tish Men and is status post R-CHOP with Ellen Henri which  was last dosed on 06/29/2019.  She was seen in the emergency room on 07/04/2019 with dehydration, nausea, constipation, and anorexia.  At the time of her visit a CBC returned showing a WBC of 0.5, ANC of 0.3 and a platelet count of 10.  Dr. Jana Hakim was contacted with instructions given to transfuse the patient with platelets.  Additionally she was given a dose of cefepime and told to begin a prescription of Cipro which was already at her pharmacy. Dr. Maylon Peppers had wanted the patient seen last week and had asked that she come to the clinic on 07/06/2019 for follow-up.  She was contacted that day and reported that she could not come in for labs today returned with a WBC of 14.3, ANC of 10.0 and a platelet count of 98.  She presents to the clinic today stating that she is doing well with no acute issues of concern.  She is feeling much better than she did when she presented to the emergency room.  She denies fevers, chills, sweats, nausea, vomiting, constipation, diarrhea, shortness of breath, or a cough.  Medications: I have reviewed the patient's current medications.  Allergies:  Allergies  Allergen Reactions  . Codeine Itching  . Sulfa Antibiotics Other (See Comments)    Childhood allergy    Past Medical History:  Diagnosis Date  . Anemia   . Arthralgia   . Arthritis   . Elevated cholesterol   . Insomnia   . Lymphoma ([place])   . Neck mass  left    Past Surgical History:  Procedure Laterality Date  . ABDOMINAL HYSTERECTOMY     partial  . IR IMAGING GUIDED PORT INSERTION  03/28/2019  . MASS BIOPSY Left 03/12/2019   Procedure: NECK MASS BIOPSY;  Surgeon: Izora Gala, MD;  Location: [facility];  Service: ENT;  Laterality: Left;  . 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 Diabetes Mother   .  Hypertension Mother   . Breast cancer Neg Hx     Social History   Socioeconomic History  . Marital status: Widowed    Spouse name: Not on file  . Number of children: 1  . Years of education: Not on file  . Highest education level: Not on file  Occupational History  . Not on file  Social Needs  . Financial resource strain: Not on file  . Food insecurity    Worry: Not on file    Inability: Not on file  . Transportation needs    Medical: No    Non-medical: No  Tobacco Use  . Smoking status: Former Smoker    Packs/day: 0.25    Years: 2.00    Pack years: 0.50    Types: Cigarettes  . Smokeless tobacco: Never Used  . Tobacco comment: she quit 30-40 years ago.   Substance and Sexual Activity  . Alcohol use: Yes    Alcohol/week: 0.0 standard drinks    Comment: occasionally  . Drug use: No  . Sexual activity: Not on file  Lifestyle  . Physical activity    Days per week: Not on file    Minutes per session: Not on file  . Stress: Not on file  Relationships  . Social Herbalist on phone: Not on file    Gets together: Not on file    Attends religious service: Not on file    Active member of club or organization: Not on file    Attends meetings of clubs or organizations: Not on file    Relationship status: Not on file  . Intimate partner violence    Fear of current or ex partner: No    Emotionally abused: No    Physically abused: No    Forced sexual activity: No  Other Topics Concern  . Not on file  Social History Narrative   Lives alone in a 2 story home.  Husband passed away on 09/10/2014.     Works one day a week at [employer].     Retired from [employer].  Exercises regularly.    Patient is right-handed.    Past Medical History, Surgical history, Social history, and Family history were reviewed and updated as appropriate.   Please see review of systems for further details on the patient's review from today.    Review of Systems:  Review of Systems  Constitutional: Negative for chills, diaphoresis and fever.  HENT: Negative for trouble swallowing and voice change.   Respiratory: Negative for cough, chest tightness, shortness of breath and wheezing.   Cardiovascular: Negative for chest pain and palpitations.  Gastrointestinal: Negative for abdominal pain, constipation, diarrhea, nausea and vomiting.  Musculoskeletal: Negative for back pain and myalgias.  Neurological: Negative for dizziness, light-headedness and headaches.    Objective:   Physical Exam:  BP 125/70 (BP Location: Left Arm, Patient Position: Sitting)   Pulse (!) 116   Temp 98 F (36.7 C) (Temporal)   Resp 17   Ht 4\' 10"  (1.473 m)   Wt 123 lb 9.6 oz (56.1 kg)   SpO2 99%   BMI 25.83 kg/m  ECOG: 0  Physical Exam Constitutional:      General: She is not in acute distress.    Appearance: She is not diaphoretic.  HENT:     Head: Normocephalic and atraumatic.  Eyes:     General: No scleral icterus.       Right eye: No discharge.        Left eye: No discharge.     Conjunctiva/sclera: Conjunctivae normal.  Cardiovascular:     Rate and Rhythm: Regular rhythm. Tachycardia present.     Heart sounds: Normal heart sounds. No murmur. No friction rub. No gallop.   Pulmonary:     Effort: Pulmonary effort is normal. No respiratory distress.     Breath sounds: Normal breath sounds. No wheezing or rales.  Skin:    General: Skin is warm and dry.     Findings: No erythema or rash.  Neurological:     Mental Status: She is alert.     Coordination: Coordination normal.     Gait: Gait normal.  Psychiatric:        Mood and Affect: Mood normal.        Behavior: Behavior normal.        Thought Content: Thought content normal.        Judgment: Judgment normal.     Lab Review:     Component Value Date/Time   NA 141 07/09/2019 1341   NA 137 01/16/2015   K 3.3 (L) 07/09/2019 1341   CL 105 07/09/2019 1341   CO2 26 07/09/2019  1341   GLUCOSE 95 07/09/2019 1341   BUN 6 (L) 07/09/2019 1341   BUN 12 01/16/2015   CREATININE 0.67 07/09/2019 1341   CALCIUM 8.7 (L) 07/09/2019 1341   PROT 6.5 07/09/2019 1341   ALBUMIN 3.8 07/09/2019 1341   AST 19 07/09/2019 1341   ALT 15 07/09/2019 1341   ALKPHOS 82 07/09/2019 1341   BILITOT <0.2 (L) 07/09/2019 1341   GFRNONAA >60 07/09/2019 1341   GFRAA >60 07/09/2019 1341       Component Value Date/Time   WBC 14.3 (H) 07/09/2019 1341   WBC 0.5 (LL) 07/04/2019 0759   RBC 3.37 (L) 07/09/2019 1341   HGB 9.6 (L) 07/09/2019 1341   HCT 29.6 (L) 07/09/2019 1341   PLT 98 (L) 07/09/2019 1341   MCV 87.8 07/09/2019 1341   MCH 28.5 07/09/2019 1341   MCHC 32.4 07/09/2019 1341   RDW 21.0 (H) 07/09/2019 1341   LYMPHSABS 0.7 07/09/2019 1341   MONOABS 1.8 (H) 07/09/2019 1341   EOSABS 0.1 07/09/2019 1341   BASOSABS 0.1 07/09/2019 1341   -------------------------------  Imaging from last 24 hours (if applicable):  Radiology interpretation: Dg Abdomen 1 View  Result Date: 07/04/2019 CLINICAL DATA:  Nausea and constipation EXAM: ABDOMEN - 1 VIEW COMPARISON:  05/04/2019 FINDINGS: The bowel gas pattern is normal. No radio-opaque calculi or other significant radiographic abnormality are seen. Mild-to-moderate stool in the colon. Degenerative changes in the lower lumbar spine IMPRESSION: Mild-to-moderate stool in the colon. Nonobstructive bowel gas pattern. Electronically Signed   By: Franchot Gallo M.D.   On: 07/04/2019 08:23   Nm Pet Image Restag (ps) Skull Base To Thigh  Result Date: 06/12/2019 CLINICAL DATA:  Subsequent treatment strategy for diffuse large B-cell lymphoma status post 3 cycles first-line chemotherapy. EXAM: NUCLEAR MEDICINE PET SKULL BASE TO THIGH TECHNIQUE: 6.3 mCi F-18 FDG was injected intravenously. Full-ring PET imaging was performed from the skull base to thigh after the radiotracer. CT data was obtained and used for attenuation correction and anatomic localization.  Fasting blood glucose: 104 mg/dl COMPARISON:  03/29/2019 PET-CT. FINDINGS: Mediastinal blood pool activity: SUV max 2.2 Liver activity: SUV max 3.3 NECK: Dominant left supraclavicular nodal mass is decreased in size and metabolism, measuring 3.8 x 3.6 cm with max SUV 17.2 (series 4/image 37), previously 7.4 x 5.6 cm with max SUV 29.3. no additional enlarged or hypermetabolic lymph nodes in the neck. Asymmetric right glottic uptake without mass correlate on the CT images, cannot exclude left vocal cord paralysis. Incidental CT findings: none CHEST: No enlarged or hypermetabolic axillary, mediastinal or hilar lymph nodes. Previously visualized hypermetabolic left retropectoral lymph nodes have resolved. No hypermetabolic pulmonary findings. Incidental CT findings: Right internal jugular Port-A-Cath terminates at the cavoatrial junction. Coronary atherosclerosis. Atherosclerotic nonaneurysmal thoracic aorta. No acute consolidative airspace disease or lung masses. Stable 4 mm posterior left lower lobe pulmonary nodule (series 8/image 41), below PET resolution. No new significant pulmonary nodules. ABDOMEN/PELVIS: No enlarged or hypermetabolic lymph nodes in the abdomen or pelvis. Previously visualized hypermetabolic left para-aortic, aortocaval and lower mesenteric lymph nodes are all now subcentimeter in size and demonstrate no appreciable uptake. Spleen is normal in size and stable in size. There is low level splenic hypermetabolism with max SUV 3.7 in the spleen (slightly above the background liver activity), stable from prior PET-CT. No focal splenic mass. No abnormal hypermetabolic activity within the liver, pancreas or adrenal glands. Incidental CT findings: Atherosclerotic nonaneurysmal abdominal aorta. Moderate sigmoid diverticulosis. Hysterectomy. SKELETON: No focal hypermetabolic activity to suggest skeletal metastasis. Low level hypermetabolism throughout the skeleton is compatible with mildly stimulated  marrow state. Incidental CT findings: none IMPRESSION: 1. Partial treatment response. Dominant left supraclavicular nodal mass is decreased in size and metabolism, with persistent Deauville score 5 uptake. Previously visualized hypermetabolic left retropectoral, retroperitoneal and lower mesenteric lymph nodes have resolved. 2. Stable low level splenic hypermetabolism, nonspecific, possibly reactive. Normal size spleen. No splenic masses. 3. Low level hypermetabolism throughout the skeleton, compatible with mildly stimulated marrow state. 4. Asymmetric right glottic uptake without CT mass correlate, cannot exclude left focal cord paralysis. 5.  Aortic Atherosclerosis (ICD10-I70.0). Electronically Signed   By: Ilona Sorrel M.D.   On: 06/12/2019 17:43

## 2019-07-12 ENCOUNTER — Encounter: Payer: Self-pay | Admitting: General Practice

## 2019-07-12 NOTE — Progress Notes (Signed)
[facility] Initial Psychosocial Assessment Clinical Social Work  Clinical Social Work contacted by phone to assess psychosocial, emotional, mental health, and spiritual needs of the patient.   Barriers to care/review of distress screen:  - Transportation:  Do you anticipate any problems getting to appointments?  Do you have someone who can help run errands for you if you need it?  "I have wonderful girlfriends who help me." - Help at home:  What is your living situation (alone, family, other)?  If you are physically unable to care for yourself, who would you call on to help you?  Lives alone - husband died 5 years ago.  Married almost 50 years.  Thus far has managed well on her own.   - Support system:  What does your support system look like?  Who would you call on if you needed some kind of practical help?  What if you needed someone to talk to for emotional support?  Many long term girlfriends, some since grade school.  "They are all there for me."  Has extended family who are supportive - nephews and nieces.  Son has relocated in [place].  Son will take to some appointments despite being out of town.   Granddaughter has come up for a month in August, lives in [place].   - Finances:  Are you concerned about finances .  Considering returning to work?  If not, applying for disability?  What is your understanding of where you are with your cancer? Its cause?  Your treatment plan and what happens next?  Diagnosed approx June 2020 "noticed something wasn't right."  Diagnosed at Urgent Care - "I still havent had time to cry about it yet - they just took me and started working on it."  Does not know anyone else w her kind of cancer.  Tries not to listen to what other people say about cancers that may/may not apply to her.  Has rituals that help her get through - [place], prayer journal that she completes 2x/day.  Allows her to remain calm and peaceful.  Is part of a local church, watches via  YouTube.    What are your worries for the future as you begin treatment for cancer?  "You always worry whether it will come back, whether they got it all."  "I know God is in the healing business, and take it one day at a time."  What are your hopes and priorities during your treatment? What is important to you? What are your goals for your care?  Grandchildren and great grandchildren are important to her - wants to be able to hug them and stay close to them.  COVID precautions have limited this.    CSW Summary:  Patient and family psychosocial functioning including strengths, limitations, and coping skills:  Has significant support from long time friends, expressed much appreciation for their care and concern for her.  Has both practical and emotional support.  Relies on faith resources to make meaning out of treatment, does not express worry.  Admits that she has not really had a chance to think about the fact that she has been diagnosed w cancer - is focused on completing treatments as scheduled.  All needs appear to be met through her own social connectivity.    Identifications of barriers to care:  None noted.  Availability of community resources:  Discussed [facility] resources - patient does not have internet or computer.    Clinical Social Worker follow  up needed: No.   Edwyna Shell, [place] Worker Phone:  [phone] Cell:  [phone]

## 2019-07-15 NOTE — Progress Notes (Signed)
Oncology Nurse Navigator Documentation  Joined [name] during Telemedicine initial consult with Dr. Isidore Moos.   . Provided introductory explanation of radiation treatment including SIM planning and purpose of Aquaplast head and shoulder mask.   . She voiced understanding: . Plan is for 4 weeks XRT to start s/p final chemo scheduled for 11/25. . S/p final chemo, restaging PET to be conducted after which CT SIM will be scheduled and RT start date determined. . She knows to call me with questions/concerns as she continues with tmt.   [name], RN, BSN Head & Neck Oncology Nurse [facility] [phone]

## 2019-07-18 ENCOUNTER — Inpatient Hospital Stay: Payer: PPO

## 2019-07-18 ENCOUNTER — Telehealth: Payer: Self-pay | Admitting: Hematology

## 2019-07-18 ENCOUNTER — Other Ambulatory Visit: Payer: Self-pay

## 2019-07-18 ENCOUNTER — Encounter: Payer: Self-pay | Admitting: Hematology

## 2019-07-18 ENCOUNTER — Inpatient Hospital Stay (HOSPITAL_BASED_OUTPATIENT_CLINIC_OR_DEPARTMENT_OTHER): Payer: PPO | Admitting: Hematology

## 2019-07-18 VITALS — BP 104/64 | HR 100 | Temp 98.7°F | Resp 18 | Ht <= 58 in | Wt 121.2 lb

## 2019-07-18 VITALS — BP 105/60 | HR 100 | Resp 18

## 2019-07-18 DIAGNOSIS — R110 Nausea: Secondary | ICD-10-CM | POA: Diagnosis not present

## 2019-07-18 DIAGNOSIS — C8331 Diffuse large B-cell lymphoma, lymph nodes of head, face, and neck: Secondary | ICD-10-CM

## 2019-07-18 DIAGNOSIS — Z79899 Other long term (current) drug therapy: Secondary | ICD-10-CM

## 2019-07-18 DIAGNOSIS — D6959 Other secondary thrombocytopenia: Secondary | ICD-10-CM

## 2019-07-18 DIAGNOSIS — D6481 Anemia due to antineoplastic chemotherapy: Secondary | ICD-10-CM | POA: Diagnosis not present

## 2019-07-18 DIAGNOSIS — K219 Gastro-esophageal reflux disease without esophagitis: Secondary | ICD-10-CM

## 2019-07-18 DIAGNOSIS — G620 Drug-induced polyneuropathy: Secondary | ICD-10-CM | POA: Diagnosis not present

## 2019-07-18 DIAGNOSIS — T451X5A Adverse effect of antineoplastic and immunosuppressive drugs, initial encounter: Secondary | ICD-10-CM | POA: Diagnosis not present

## 2019-07-18 DIAGNOSIS — Z5189 Encounter for other specified aftercare: Secondary | ICD-10-CM | POA: Diagnosis not present

## 2019-07-18 DIAGNOSIS — C8330 Diffuse large B-cell lymphoma, unspecified site: Secondary | ICD-10-CM

## 2019-07-18 DIAGNOSIS — Z791 Long term (current) use of non-steroidal anti-inflammatories (NSAID): Secondary | ICD-10-CM

## 2019-07-18 LAB — CBC WITH DIFFERENTIAL (CANCER CENTER ONLY)
Abs Immature Granulocytes: 0.06 10*3/uL (ref 0.00–0.07)
Basophils Absolute: 0 10*3/uL (ref 0.0–0.1)
Basophils Relative: 1 %
Eosinophils Absolute: 0.1 10*3/uL (ref 0.0–0.5)
Eosinophils Relative: 1 %
HCT: 31.9 % — ABNORMAL LOW (ref 36.0–46.0)
Hemoglobin: 10.2 g/dL — ABNORMAL LOW (ref 12.0–15.0)
Immature Granulocytes: 1 %
Lymphocytes Relative: 12 %
Lymphs Abs: 0.5 10*3/uL — ABNORMAL LOW (ref 0.7–4.0)
MCH: 28.7 pg (ref 26.0–34.0)
MCHC: 32 g/dL (ref 30.0–36.0)
MCV: 89.9 fL (ref 80.0–100.0)
Monocytes Absolute: 1.2 10*3/uL — ABNORMAL HIGH (ref 0.1–1.0)
Monocytes Relative: 26 %
Neutro Abs: 2.7 10*3/uL (ref 1.7–7.7)
Neutrophils Relative %: 59 %
Platelet Count: 353 10*3/uL (ref 150–400)
RBC: 3.55 MIL/uL — ABNORMAL LOW (ref 3.87–5.11)
RDW: 22.8 % — ABNORMAL HIGH (ref 11.5–15.5)
WBC Count: 4.5 10*3/uL (ref 4.0–10.5)
nRBC: 0 % (ref 0.0–0.2)

## 2019-07-18 LAB — CMP (CANCER CENTER ONLY)
ALT: 17 U/L (ref 0–44)
AST: 20 U/L (ref 15–41)
Albumin: 3.6 g/dL (ref 3.5–5.0)
Alkaline Phosphatase: 49 U/L (ref 38–126)
Anion gap: 8 (ref 5–15)
BUN: 9 mg/dL (ref 8–23)
CO2: 25 mmol/L (ref 22–32)
Calcium: 9.1 mg/dL (ref 8.9–10.3)
Chloride: 108 mmol/L (ref 98–111)
Creatinine: 0.66 mg/dL (ref 0.44–1.00)
GFR, Est AFR Am: >60 mL/min (ref >60)
GFR, Estimated: >60 mL/min (ref >60)
Glucose, Bld: 99 mg/dL (ref 70–99)
Potassium: 4.4 mmol/L (ref 3.5–5.1)
Sodium: 141 mmol/L (ref 135–145)
Total Bilirubin: 0.2 mg/dL — ABNORMAL LOW (ref 0.3–1.2)
Total Protein: 6.4 g/dL — ABNORMAL LOW (ref 6.5–8.1)

## 2019-07-18 MED ORDER — SODIUM CHLORIDE 0.9 % IV SOLN
600.0000 mg/m2 | Freq: Once | INTRAVENOUS | Status: AC
  Administered 2019-07-18: 940 mg via INTRAVENOUS
  Filled 2019-07-18: qty 47

## 2019-07-18 MED ORDER — PALONOSETRON HCL INJECTION 0.25 MG/5ML
0.2500 mg | Freq: Once | INTRAVENOUS | Status: AC
  Administered 2019-07-18: 0.25 mg via INTRAVENOUS

## 2019-07-18 MED ORDER — DIPHENHYDRAMINE HCL 25 MG PO CAPS
50.0000 mg | ORAL_CAPSULE | Freq: Once | ORAL | Status: AC
  Administered 2019-07-18: 50 mg via ORAL

## 2019-07-18 MED ORDER — DIPHENHYDRAMINE HCL 25 MG PO CAPS
ORAL_CAPSULE | ORAL | Status: AC
  Filled 2019-07-18: qty 2

## 2019-07-18 MED ORDER — ACETAMINOPHEN 325 MG PO TABS
ORAL_TABLET | ORAL | Status: AC
  Filled 2019-07-18: qty 2

## 2019-07-18 MED ORDER — SODIUM CHLORIDE 0.9 % IV SOLN
Freq: Once | INTRAVENOUS | Status: AC
  Administered 2019-07-18: 11:00:00 via INTRAVENOUS
  Filled 2019-07-18: qty 5

## 2019-07-18 MED ORDER — SODIUM CHLORIDE 0.9 % IV SOLN
375.0000 mg/m2 | Freq: Once | INTRAVENOUS | Status: AC
  Administered 2019-07-18: 600 mg via INTRAVENOUS
  Filled 2019-07-18: qty 10

## 2019-07-18 MED ORDER — VINCRISTINE SULFATE CHEMO INJECTION 1 MG/ML
2.0000 mg | Freq: Once | INTRAVENOUS | Status: AC
  Administered 2019-07-18: 2 mg via INTRAVENOUS
  Filled 2019-07-18: qty 2

## 2019-07-18 MED ORDER — SODIUM CHLORIDE 0.9 % IV SOLN
Freq: Once | INTRAVENOUS | Status: AC
  Administered 2019-07-18: 11:00:00 via INTRAVENOUS
  Filled 2019-07-18: qty 250

## 2019-07-18 MED ORDER — SODIUM CHLORIDE 0.9% FLUSH
10.0000 mL | INTRAVENOUS | Status: DC | PRN
  Administered 2019-07-18: 10 mL
  Filled 2019-07-18: qty 10

## 2019-07-18 MED ORDER — ACETAMINOPHEN 325 MG PO TABS
650.0000 mg | ORAL_TABLET | Freq: Once | ORAL | Status: AC
  Administered 2019-07-18: 650 mg via ORAL

## 2019-07-18 MED ORDER — METOCLOPRAMIDE HCL 10 MG PO TABS: 10.0000 mg | ORAL_TABLET | Freq: Three times a day (TID) | ORAL | 0 refills | Status: DC | PRN

## 2019-07-18 MED ORDER — DOXORUBICIN HCL CHEMO IV INJECTION 2 MG/ML
50.0000 mg/m2 | Freq: Once | INTRAVENOUS | Status: AC
  Administered 2019-07-18: 80 mg via INTRAVENOUS
  Filled 2019-07-18: qty 40

## 2019-07-18 MED ORDER — HEPARIN SOD (PORK) LOCK FLUSH 100 UNIT/ML IV SOLN
500.0000 [IU] | Freq: Once | INTRAVENOUS | Status: AC | PRN
  Administered 2019-07-18: 500 [IU]
  Filled 2019-07-18: qty 5

## 2019-07-18 MED ORDER — PALONOSETRON HCL INJECTION 0.25 MG/5ML
INTRAVENOUS | Status: AC
  Filled 2019-07-18: qty 5

## 2019-07-18 NOTE — Telephone Encounter (Signed)
Scheduled per los. Gave avs and calendar  

## 2019-07-18 NOTE — Progress Notes (Signed)
[place] OFFICE PROGRESS NOTE  Patient Care Team: Jonathon Jordan, MD as PCP - General (Family Medicine) Tish Men, MD as Consulting Physician (Hematology) Eppie Gibson, MD as Attending Physician (Radiation Oncology) Leota Sauers, RN as Oncology Nurse Navigator Jennet Maduro, RD as Dietitian (Dietician)  HEME/ONC OVERVIEW: 1. 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 BCL2 rearrangement positive, no BCL6 or Myc rearrangement -03/2019: left supraclavicular (bulky), left axillary, RP, mesenteric and iliac adenopathy on PET; bone marrow bx negative for lymphoma  -Mid-03/2019 - present: R-CHOP with G-CSF support   Interim PET (after 4 cycles) showed decrease in L supraclavicular mass and resolution of other LN disease   2. 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 SqIsidore Moosf radiation oncology, and we will coordinate the timing of ISRT   -PRN anti-emetics: Zofran, Compazine, and Ativan   Chemotherapy-associated thrombocytopenia -Secondary to chemotherapy -Plts 10k on 07/04/2019 (one week after the last cycle of chemotherapy), which has improved back to  normal today -Patient denies any symptoms of bleeding or excess bruising, such as epistaxis, hematochezia, melena, or hematuria -Given the severe thrombocytopenia, I have reduced her cyclophosphamide dose to 60031m2  -We will monitor it closely   Chemotherapy-associated anemia -Secondary to chemotherapy -Hgb 10.2, slightly lower than the last visit  -Patient denies any symptom of bleeding -We will monitor for now; see dose adjustment above   Chemotherapy-associated nausea  -Secondary to chemotherapy -Overall relatively well controlled  -I have refilled Reglan today  -Continue PRN-antiemetics  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  All questions were answered. The patient knows to call the clinic with any problems, questions or concerns. No barriers to learning was detected.  Return in 3 weeks for labs, port flush, PET results and clinic appt.  YanTish MenD 07/18/2019 10:23 AM  CHIEF COMPLAINT: "I am feeling better"  INTERVAL HISTORY: [facility] for follow-up of DLBCL on R-CHOP.  Patient was recently seen in the ER for nausea and vomiting, and was found with an incidental platelet count of 10,000.  She denies any abnormal bleeding or excess bruising.  She was prescribed Reglan PRN, but she has been taking it every 6 hours on a schedule, and feels that it has helped her nausea significantly.  Her appetite is improving.  She still has some mild numbness/tingling sensation in the bilateral fingertips, but denies any interference with ADLs or worsening intensity.  She tried gabapentin, but could not tolerate it due to sedation, and has stopped taking it.  She denies any other complaint today.  REVIEW OF SYSTEMS:   Constitutional: ( - ) fevers, ( - )  chills , ( -  ) night sweats Eyes: ( - ) blurriness of vision, ( - ) double vision, ( - ) watery eyes Ears, nose, mouth, throat, and face: ( - ) mucositis, ( - ) sore throat Respiratory: ( - ) cough, ( - ) dyspnea, ( - ) wheezes Cardiovascular: ( - ) palpitation, ( - ) chest discomfort, ( - ) lower extremity swelling Gastrointestinal:  ( + ) nausea, ( - ) heartburn, ( - ) change in bowel habits Skin: ( - ) abnormal skin rashes Lymphatics: ( - ) new lymphadenopathy, ( - ) easy bruising Neurological: ( + ) numbness, ( + ) tingling, ( - ) new weaknesses Behavioral/Psych: ( - ) mood change, ( - ) new changes  All other systems were reviewed with the patient and are negative.  SUMMARY OF ONCOLOGIC HISTORY: Oncology History  DLBCL (diffuse large B cell lymphoma) ([place])  02/19/2019 Imaging   CT neck: IMPRESSION: 1. 7 cm left lower neck mass extending into the superior mediastinum most concerning for malignancy. This may reflect a conglomerate nodal mass or primary tumor of uncertain origin. 2. Additional enlarged lymph nodes adjacent to the mass in the left supraclavicular/retroclavicular and left subpectoral regions. 3.  Aortic Atherosclerosis (ICD10-I70.0).   03/12/2019 Procedure   Incisional left cervical LN bx by Dr. Constance Holster   03/12/2019 Pathology Results   Accession: [id number] Soft tissue mass, simple excision, Left Neck - DIFFUSE LARGE B-CELL LYMPHOMA - SEE COMMENT   03/19/2019 Initial Diagnosis   DLBCL (diffuse large B cell lymphoma) ([place])   03/29/2019 Imaging   PET: IMPRESSION: 1. The large left supraclavicular mass is intensely hypermetabolic compatible with history of lymphoma. There are also nearby FDG avid left supraclavicular, left axillary, and left retroperitoneal lymph nodes. 2. Hypermetabolic retroperitoneal, mesenteric and iliac adenopathy. 3. Aortic Atherosclerosis (ICD10-I70.0). Coronary artery calcifications.   04/04/2019 -  Chemotherapy   The patient had DOXOrubicin  (ADRIAMYCIN) chemo injection 80 mg, 50 mg/m2 = 80 mg, Intravenous,  Once, 5 of 6 cycles Administration: 80 mg (04/04/2019), 80 mg (04/25/2019), 80 mg (05/16/2019), 80 mg (06/06/2019), 80 mg (06/27/2019) palonosetron (ALOXI) injection 0.25 mg, 0.25 mg, Intravenous,  Once, 5 of 6 cycles Administration: 0.25 mg (04/04/2019), 0.25 mg (04/25/2019), 0.25 mg (05/16/2019), 0.25 mg (06/06/2019), 0.25 mg (06/27/2019) pegfilgrastim-jmdb (FULPHILA) injection 6 mg, 6 mg, Subcutaneous,  Once, 5 of 6 cycles Administration: 6 mg (04/06/2019), 6 mg (04/27/2019), 6 mg (05/18/2019), 6 mg (06/08/2019), 6 mg (06/29/2019) vinCRIStine (ONCOVIN) 2 mg in sodium chloride 0.9 % 50 mL chemo infusion, 2 mg, Intravenous,  Once, 5 of 6 cycles Administration: 2 mg (04/04/2019), 2 mg (04/25/2019), 2 mg (05/16/2019), 2 mg (06/06/2019), 2 mg (06/27/2019) riTUXimab (RITUXAN) 600 mg in sodium chloride 0.9 % 250 mL (1.9355 mg/mL) infusion, 375 mg/m2 = 600 mg, Intravenous,  Once, 1 of 1 cycle Administration: 600 mg (04/04/2019) cyclophosphamide (CYTOXAN) 1,180 mg in sodium chloride 0.9 % 250 mL chemo infusion, 750 mg/m2 = 1,180 mg, Intravenous,  Once, 5 of 6 cycles Administration: 1,180 mg (04/04/2019), 1,180 mg (04/25/2019), 1,180 mg (05/16/2019), 1,180 mg (06/06/2019), 1,180 mg (06/27/2019) riTUXimab (RITUXAN) 600 mg in sodium chloride 0.9 % 190 mL infusion, 375 mg/m2 = 600 mg, Intravenous,  Once, 4 of 5 cycles Administration: 600 mg (04/25/2019), 600 mg (05/16/2019), 600 mg (06/06/2019), 600 mg (06/27/2019) fosaprepitant (EMEND) 150 mg, dexamethasone (DECADRON) 12 mg in  sodium chloride 0.9 % 145 mL IVPB, , Intravenous,  Once, 5 of 6 cycles Administration:  (04/04/2019),  (04/25/2019),  (05/16/2019),  (06/06/2019),  (06/27/2019)  for chemotherapy treatment.    06/12/2019 Imaging   PET (after 4 cycles of R-CHOP): IMPRESSION: 1. Partial treatment response. Dominant left supraclavicular nodal mass is decreased in size and metabolism, with persistent Deauville score 5  uptake. Previously visualized hypermetabolic left retropectoral, retroperitoneal and lower mesenteric lymph nodes have resolved. 2. Stable low level splenic hypermetabolism, nonspecific, possibly reactive. Normal size spleen. No splenic masses. 3. Low level hypermetabolism throughout the skeleton, compatible with mildly stimulated marrow state. 4. Asymmetric right glottic uptake without CT mass correlate, cannot exclude left focal cord paralysis. 5.  Aortic Atherosclerosis (ICD10-I70.0).     I have reviewed the past medical history, past surgical history, social history and family history with the patient and they are unchanged from previous note.  ALLERGIES:  is allergic to codeine and sulfa antibiotics.  MEDICATIONS:  Current Outpatient Medications  Medication Sig Dispense Refill  . acetaminophen (TYLENOL) 325 MG tablet Take 650 mg by mouth every 6 (six) hours as needed for mild pain or headache.     . albuterol (VENTOLIN HFA) 108 (90 Base) MCG/ACT inhaler Inhale 2 puffs into the lungs every 6 (six) hours as needed.    [name] Kitchen allopurinol (ZYLOPRIM) 300 MG tablet Take 1 tablet (300 mg total) by mouth daily. 30 tablet 3  . celecoxib (CELEBREX) 200 MG capsule Take 200 mg by mouth every other day.    . ciprofloxacin (CIPRO) 500 MG tablet Take 1 tablet (500 mg total) by mouth 2 (two) times daily. 10 tablet 0  . gabapentin (NEURONTIN) 400 MG capsule Take 1 capsule (400 mg total) by mouth 2 (two) times daily. 60 capsule 2  . HYDROcodone-acetaminophen (NORCO) 7.5-325 MG tablet Take 1 tablet by mouth every 6 (six) hours as needed for moderate pain. (Patient not taking: Reported on 06/27/2019) 120 tablet 0  . lidocaine-prilocaine (EMLA) cream Apply to affected area once (Patient not taking: Reported on 06/27/2019) 30 g 3  . LORazepam (ATIVAN) 0.5 MG tablet Take 1 tablet (0.5 mg total) by mouth 2 (two) times daily as needed (for refractory nausea and vomiting). 30 tablet 0  . metoCLOPramide (REGLAN) 10  MG tablet Take 1 tablet (10 mg total) by mouth every 6 (six) hours. 30 tablet 0  . Multiple Vitamin (MULTIVITAMIN) tablet Take 1 tablet by mouth daily.    . ondansetron (ZOFRAN) 8 MG tablet Take 1 tablet (8 mg total) by mouth 2 (two) times daily as needed for refractory nausea / vomiting. Start on day 3 after cyclophosphamide chemotherapy. (Patient not taking: Reported on 07/10/2019) 30 tablet 1  . pantoprazole (PROTONIX) 20 MG tablet Take 1 tablet (20 mg total) by mouth daily. 30 tablet 5  . pegfilgrastim (NEULASTA ONPRO KIT) 6 MG/0.6ML injection Inject 6 mg into the skin as directed. Inject via provided programmed delivery device.  Every 3 weeks    . polyvinyl alcohol (LIQUIFILM TEARS) 1.4 % ophthalmic solution Place 1 drop into both eyes as needed for dry eyes (allergies).    . potassium chloride SA (KLOR-CON) 20 MEQ tablet Take 1 tablet (20 mEq total) by mouth daily. (Patient taking differently: Take 20 mEq by mouth daily. Hasn't started it yet. She plans to pick it up today -- 07/10/19) 10 tablet 0  . predniSONE (DELTASONE) 20 MG tablet Take 100 mg by mouth as directed. Take 5 tablets (166m) by mouth daily  for 5 days. Take on days 1-5 of chemotherapy     . prochlorperazine (COMPAZINE) 10 MG tablet Take 1 tablet (10 mg total) by mouth every 6 (six) hours as needed (Nausea or vomiting). (Patient not taking: Reported on 07/10/2019) 30 tablet 6  . traMADol (ULTRAM) 50 MG tablet Take 50 mg by mouth every 6 (six) hours as needed for moderate pain.     . traZODone (DESYREL) 50 MG tablet Take 1 tablet (50 mg total) by mouth at bedtime as needed for sleep. 30 tablet 5   No current facility-administered medications for this visit.     PHYSICAL EXAMINATION: ECOG PERFORMANCE STATUS: 1 - Symptomatic but completely ambulatory  Today's Vitals   07/18/19 1015  BP: 104/64  Pulse: 100  Resp: 18  Temp: 98.7 F (37.1 C)  TempSrc: Temporal  SpO2: 100%  Weight: 121 lb 3.2 oz (55 kg)  Height: _0   (1.473 m)   Body mass index is 25.33 kg/m.  Filed Weights   07/18/19 1015  Weight: 121 lb 3.2 oz (55 kg)    GENERAL: alert, no distress and comfortable SKIN: skin color, texture, turgor are normal, no rashes or significant lesions EYES: conjunctiva are pink and non-injected, sclera clear OROPHARYNX: no exudate, no erythema; lips, buccal mucosa, and tongue normal  NECK: supple, non-tender LYMPH:  no palpable lymphadenopathy in the cervical, shotty left cervical LN's < 1cm  LUNGS: clear to auscultation with normal breathing effort HEART: regular rate & rhythm and no murmurs and no lower extremity edema ABDOMEN: soft, non-tender, non-distended, normal bowel sounds Musculoskeletal: no cyanosis of digits and no clubbing  PSYCH: alert & oriented x 3, fluent speech  LABORATORY DATA:  I have reviewed the data as listed    Component Value Date/Time   NA 141 07/09/2019 1341   NA 137 01/16/2015   K 3.3 (L) 07/09/2019 1341   CL 105 07/09/2019 1341   CO2 26 07/09/2019 1341   GLUCOSE 95 07/09/2019 1341   BUN 6 (L) 07/09/2019 1341   BUN 12 01/16/2015   CREATININE 0.67 07/09/2019 1341   CALCIUM 8.7 (L) 07/09/2019 1341   PROT 6.5 07/09/2019 1341   ALBUMIN 3.8 07/09/2019 1341   AST 19 07/09/2019 1341   ALT 15 07/09/2019 1341   ALKPHOS 82 07/09/2019 1341   BILITOT <0.2 (L) 07/09/2019 1341   GFRNONAA >60 07/09/2019 1341   GFRAA >60 07/09/2019 1341    No results found for: SPEP, UPEP  Lab Results  Component Value Date   WBC 4.5 07/18/2019   NEUTROABS 2.7 07/18/2019   HGB 10.2 (L) 07/18/2019   HCT 31.9 (L) 07/18/2019   MCV 89.9 07/18/2019   PLT 353 07/18/2019      Chemistry      Component Value Date/Time   NA 141 07/09/2019 1341   NA 137 01/16/2015   K 3.3 (L) 07/09/2019 1341   CL 105 07/09/2019 1341   CO2 26 07/09/2019 1341   BUN 6 (L) 07/09/2019 1341   BUN 12 01/16/2015   CREATININE 0.67 07/09/2019 1341   GLU 96 01/16/2015      Component Value Date/Time   CALCIUM  8.7 (L) 07/09/2019 1341   ALKPHOS 82 07/09/2019 1341   AST 19 07/09/2019 1341   ALT 15 07/09/2019 1341   BILITOT <0.2 (L) 07/09/2019 1341       RADIOGRAPHIC STUDIES: I have personally reviewed the radiological images as listed below and agreed with the findings in the report. Dg Abdomen 1 View  Result Date: 07/04/2019 CLINICAL DATA:  Nausea and constipation EXAM: ABDOMEN - 1 VIEW COMPARISON:  05/04/2019 FINDINGS: The bowel gas pattern is normal. No radio-opaque calculi or other significant radiographic abnormality are seen. Mild-to-moderate stool in the colon. Degenerative changes in the lower lumbar spine IMPRESSION: Mild-to-moderate stool in the colon. Nonobstructive bowel gas pattern. Electronically Signed   By: Franchot Gallo M.D.   On: 07/04/2019 08:23

## 2019-07-18 NOTE — Progress Notes (Signed)
Nutrition Follow-up:  Patient with lymphoma followed by Dr. Maylon Peppers.  Patient receiving last cycle of chemotherapy today.  Planning radiation following chemotherapy.   Met with patient today during infusion.  Reports that she is feeling better after trip to ED.  Reports that reglan is helping with nausea and moving bowels.   Reports that she has stopped drinking ensure shakes because has been eating more solid foods.     Medications: reglan  Labs: reviewed  Anthropometrics:   Weight 121 lb 3.2 oz today decreased from 124 lb 9.6 oz.     NUTRITION DIAGNOSIS: Unintentional weight loss continues   INTERVENTION:  Reviewed strategies to increase calories and protein in diet. Encouraged patient to drink at least 1 ensure enlive per day. Coupons given.   Contact information provided    MONITORING, EVALUATION, GOAL: Patient will increase calories and protein to prevent weight loss   NEXT VISIT: phone call Dec 16  Lavoris Sparling B. Zenia Resides, [place], [place] Registered Dietitian [phone] (pager)

## 2019-07-18 NOTE — Patient Instructions (Signed)
[facility] Discharge Instructions for Patients Receiving Chemotherapy  Today you received the following chemotherapy agents Adriamycin, Vincristine, Cytoxan, Rituxan.  To help prevent nausea and vomiting after your treatment, we encourage you to take your nausea medication as directed.  If you develop nausea and vomiting that is not controlled by your nausea medication, call the clinic.   BELOW ARE SYMPTOMS THAT SHOULD BE REPORTED IMMEDIATELY:  *FEVER GREATER THAN 100.5 F  *CHILLS WITH OR WITHOUT FEVER  NAUSEA AND VOMITING THAT IS NOT CONTROLLED WITH YOUR NAUSEA MEDICATION  *UNUSUAL SHORTNESS OF BREATH  *UNUSUAL BRUISING OR BLEEDING  TENDERNESS IN MOUTH AND THROAT WITH OR WITHOUT PRESENCE OF ULCERS  *URINARY PROBLEMS  *BOWEL PROBLEMS  UNUSUAL RASH Items with * indicate a potential emergency and should be followed up as soon as possible.  Feel free to call the clinic should you have any questions or concerns. The clinic phone number is [phone].  Please show the CHEMO ALERT CARD at check-in to the Emergency Department and triage nurse.   

## 2019-07-20 ENCOUNTER — Other Ambulatory Visit: Payer: Self-pay

## 2019-07-20 ENCOUNTER — Inpatient Hospital Stay: Payer: PPO

## 2019-07-20 VITALS — BP 138/85 | Temp 98.3°F | Resp 18

## 2019-07-20 DIAGNOSIS — C8331 Diffuse large B-cell lymphoma, lymph nodes of head, face, and neck: Secondary | ICD-10-CM

## 2019-07-20 DIAGNOSIS — Z5189 Encounter for other specified aftercare: Secondary | ICD-10-CM | POA: Diagnosis not present

## 2019-07-20 MED ORDER — PEGFILGRASTIM-JMDB 6 MG/0.6ML ~~LOC~~ SOSY
6.0000 mg | PREFILLED_SYRINGE | Freq: Once | SUBCUTANEOUS | Status: AC
  Administered 2019-07-20: 6 mg via SUBCUTANEOUS

## 2019-07-20 MED ORDER — PEGFILGRASTIM-JMDB 6 MG/0.6ML ~~LOC~~ SOSY
PREFILLED_SYRINGE | SUBCUTANEOUS | Status: AC
  Filled 2019-07-20: qty 0.6

## 2019-07-20 NOTE — Patient Instructions (Signed)

## 2019-07-23 ENCOUNTER — Telehealth: Payer: Self-pay

## 2019-07-23 ENCOUNTER — Telehealth: Payer: Self-pay | Admitting: *Deleted

## 2019-07-23 NOTE — Telephone Encounter (Signed)
Received call from pt questioning if she should continue taking Allopurinol & Claritin now that she has completed treatment.   Per Dr Maylon Peppers, pt to continue Allopurinol x 1 month. Can take Claritin as needed but does not need to take it routinely since she is no longer getting Neulasta.   This information left on personalized VM with instructions to call with questions or concerns. dph

## 2019-07-23 NOTE — Telephone Encounter (Signed)
Oncology Nurse Navigator Documentation  Rec'd call from [facility] re continuation of several medications.  I encouraged her to call Dr. Lorette Ang [facility] office for guidance.  Reviewed upcoming appts.  Sandra Orem, RN, BSN Head & Neck Oncology Nurse [place] at [place] [phone]

## 2019-07-24 ENCOUNTER — Encounter: Payer: Self-pay | Admitting: Hematology

## 2019-07-24 NOTE — Progress Notes (Signed)
Patient called regarding applying for grants. Advised patient proof of income is needed and she may bring on 08/01/19 when she comes and provided income guidelines. She verbalized understanding and has my card for any additional financial questions or concerns.

## 2019-07-25 ENCOUNTER — Other Ambulatory Visit: Payer: Self-pay | Admitting: Emergency Medicine

## 2019-07-25 ENCOUNTER — Telehealth: Payer: Self-pay | Admitting: *Deleted

## 2019-07-25 DIAGNOSIS — C8331 Diffuse large B-cell lymphoma, lymph nodes of head, face, and neck: Secondary | ICD-10-CM

## 2019-07-25 NOTE — Telephone Encounter (Signed)
Pt called with concerns about shortness of breath, increased HR, and increased fatigue.  Stated symptoms started since beginning of chemo.  Pt stated able to eat and drink fine.  Stated symptoms not worsened but enough to concern pt.  Nurse could notice shortness of breath but no distress sign noted  during conversation. Offered pt to come in now for [facility] visit, but pt declined.  Pt wished to come in on Thursday am. Spoke with pt again and gave appt for 9 am lab, 930 am [facility] visit on 07/26/19.  Pt voiced understanding. Schedule message sent. Pt's   Phone    [phone].

## 2019-07-26 ENCOUNTER — Inpatient Hospital Stay: Payer: PPO | Attending: Hematology | Admitting: Medical

## 2019-07-26 ENCOUNTER — Inpatient Hospital Stay: Payer: PPO

## 2019-07-26 ENCOUNTER — Other Ambulatory Visit: Payer: Self-pay

## 2019-07-26 VITALS — BP 100/68 | HR 93 | Temp 98.2°F | Resp 18 | Ht <= 58 in | Wt 121.0 lb

## 2019-07-26 DIAGNOSIS — D6481 Anemia due to antineoplastic chemotherapy: Secondary | ICD-10-CM | POA: Diagnosis not present

## 2019-07-26 DIAGNOSIS — G620 Drug-induced polyneuropathy: Secondary | ICD-10-CM | POA: Diagnosis not present

## 2019-07-26 DIAGNOSIS — R200 Anesthesia of skin: Secondary | ICD-10-CM | POA: Insufficient documentation

## 2019-07-26 DIAGNOSIS — R5383 Other fatigue: Secondary | ICD-10-CM | POA: Insufficient documentation

## 2019-07-26 DIAGNOSIS — T451X5A Adverse effect of antineoplastic and immunosuppressive drugs, initial encounter: Secondary | ICD-10-CM | POA: Insufficient documentation

## 2019-07-26 DIAGNOSIS — Z87891 Personal history of nicotine dependence: Secondary | ICD-10-CM | POA: Insufficient documentation

## 2019-07-26 DIAGNOSIS — Z95828 Presence of other vascular implants and grafts: Secondary | ICD-10-CM | POA: Insufficient documentation

## 2019-07-26 DIAGNOSIS — Z791 Long term (current) use of non-steroidal anti-inflammatories (NSAID): Secondary | ICD-10-CM | POA: Insufficient documentation

## 2019-07-26 DIAGNOSIS — C8331 Diffuse large B-cell lymphoma, lymph nodes of head, face, and neck: Secondary | ICD-10-CM

## 2019-07-26 DIAGNOSIS — Z79899 Other long term (current) drug therapy: Secondary | ICD-10-CM | POA: Insufficient documentation

## 2019-07-26 DIAGNOSIS — C8330 Diffuse large B-cell lymphoma, unspecified site: Secondary | ICD-10-CM | POA: Insufficient documentation

## 2019-07-26 DIAGNOSIS — D696 Thrombocytopenia, unspecified: Secondary | ICD-10-CM | POA: Diagnosis not present

## 2019-07-26 DIAGNOSIS — R110 Nausea: Secondary | ICD-10-CM | POA: Diagnosis not present

## 2019-07-26 DIAGNOSIS — R531 Weakness: Secondary | ICD-10-CM | POA: Diagnosis not present

## 2019-07-26 LAB — CBC WITH DIFFERENTIAL (CANCER CENTER ONLY)
Abs Immature Granulocytes: 0.01 10*3/uL (ref 0.00–0.07)
Basophils Absolute: 0 10*3/uL (ref 0.0–0.1)
Basophils Relative: 3 %
Eosinophils Absolute: 0 10*3/uL (ref 0.0–0.5)
Eosinophils Relative: 7 %
HCT: 27.2 % — ABNORMAL LOW (ref 36.0–46.0)
Hemoglobin: 8.8 g/dL — ABNORMAL LOW (ref 12.0–15.0)
Immature Granulocytes: 2 %
Lymphocytes Relative: 17 %
Lymphs Abs: 0.1 10*3/uL — ABNORMAL LOW (ref 0.7–4.0)
MCH: 29.5 pg (ref 26.0–34.0)
MCHC: 32.4 g/dL (ref 30.0–36.0)
MCV: 91.3 fL (ref 80.0–100.0)
Monocytes Absolute: 0.1 10*3/uL (ref 0.1–1.0)
Monocytes Relative: 18 %
Neutro Abs: 0.3 10*3/uL — CL (ref 1.7–7.7)
Neutrophils Relative %: 53 %
Platelet Count: 5 10*3/uL — CL (ref 150–400)
RBC: 2.98 MIL/uL — ABNORMAL LOW (ref 3.87–5.11)
RDW: 19.2 % — ABNORMAL HIGH (ref 11.5–15.5)
WBC Count: 0.6 10*3/uL — CL (ref 4.0–10.5)
nRBC: 0 % (ref 0.0–0.2)

## 2019-07-26 LAB — CMP (CANCER CENTER ONLY)
ALT: 12 U/L (ref 0–44)
AST: 11 U/L — ABNORMAL LOW (ref 15–41)
Albumin: 3.5 g/dL (ref 3.5–5.0)
Alkaline Phosphatase: 52 U/L (ref 38–126)
Anion gap: 9 (ref 5–15)
BUN: 8 mg/dL (ref 8–23)
CO2: 27 mmol/L (ref 22–32)
Calcium: 8.9 mg/dL (ref 8.9–10.3)
Chloride: 103 mmol/L (ref 98–111)
Creatinine: 0.59 mg/dL (ref 0.44–1.00)
GFR, Est AFR Am: >60 mL/min (ref >60)
GFR, Estimated: >60 mL/min (ref >60)
Glucose, Bld: 103 mg/dL — ABNORMAL HIGH (ref 70–99)
Potassium: 3.8 mmol/L (ref 3.5–5.1)
Sodium: 139 mmol/L (ref 135–145)
Total Bilirubin: 0.6 mg/dL (ref 0.3–1.2)
Total Protein: 6 g/dL — ABNORMAL LOW (ref 6.5–8.1)

## 2019-07-26 LAB — ABO/RH: ABO/RH(D): O NEG

## 2019-07-26 LAB — SAMPLE TO BLOOD BANK

## 2019-07-26 MED ORDER — ACETAMINOPHEN 325 MG PO TABS
650.0000 mg | ORAL_TABLET | Freq: Once | ORAL | Status: AC
  Administered 2019-07-26: 650 mg via ORAL

## 2019-07-26 MED ORDER — SODIUM CHLORIDE 0.9% FLUSH
10.0000 mL | INTRAVENOUS | Status: DC | PRN
  Filled 2019-07-26: qty 10

## 2019-07-26 MED ORDER — HEPARIN SOD (PORK) LOCK FLUSH 100 UNIT/ML IV SOLN
500.0000 [IU] | Freq: Once | INTRAVENOUS | Status: AC
  Administered 2019-07-26: 500 [IU] via INTRAVENOUS
  Filled 2019-07-26: qty 5

## 2019-07-26 MED ORDER — ACETAMINOPHEN 325 MG PO TABS
ORAL_TABLET | ORAL | Status: AC
  Filled 2019-07-26: qty 2

## 2019-07-26 MED ORDER — SODIUM CHLORIDE 0.9 % IV SOLN
Freq: Once | INTRAVENOUS | Status: AC
  Administered 2019-07-26: 11:00:00 via INTRAVENOUS
  Filled 2019-07-26: qty 250

## 2019-07-26 MED ORDER — SODIUM CHLORIDE 0.9% FLUSH
10.0000 mL | INTRAVENOUS | Status: AC | PRN
  Administered 2019-07-26: 13:00:00 10 mL
  Filled 2019-07-26: qty 10

## 2019-07-26 NOTE — Progress Notes (Signed)
Pt received 1 unit platelets today, tolerated well.  VSS.  Ate and drank during transfusion w/out any issues.  Tolerated platelet infusion rate of 500 ml/hr.  Reports understanding of d/c and f/u information, denies any questions or concerns at time of d/c.

## 2019-07-26 NOTE — Patient Instructions (Signed)

## 2019-07-27 ENCOUNTER — Telehealth: Payer: Self-pay | Admitting: Emergency Medicine

## 2019-07-27 DIAGNOSIS — C8331 Diffuse large B-cell lymphoma, lymph nodes of head, face, and neck: Secondary | ICD-10-CM

## 2019-07-27 LAB — BPAM PLATELET PHERESIS
Blood Product Expiration Date: 202012042359
ISSUE DATE / TIME: 202012031132
Unit Type and Rh: 6200

## 2019-07-27 LAB — PREPARE PLATELET PHERESIS: Unit division: 0

## 2019-07-27 NOTE — Progress Notes (Signed)
[facility] Progress Note   TOSHIANA DEWING TV:[id number] [birth year] 78 y.o.  Sandra Payne is managed by Dr. Tish Men  Actively treated with chemotherapy/immunotherapy/hormonal therapy: yes  Current therapy: R-CHOP   Last treated: 07/18/2019 (cycle 6, day 1)  Next scheduled appointment with provider: 08/01/2019  Assessment: Plan:    Port-A-Cath in place - Plan: sodium chloride flush (NS) 0.9 % injection 10 mL, heparin lock flush 100 unit/mL  Thrombocytopenia ([place]) - Plan: Practitioner attestation of consent, Complete patient signature process for consent form, Care order/instruction, sodium chloride flush (NS) 0.9 % injection 10 mL, Prepare Pheresed Platelets, Transfuse platelet pheresis, acetaminophen (TYLENOL) tablet 650 mg, Prepare Pheresed Platelets, Care order/instruction, Complete patient signature process for consent form, Practitioner attestation of consent, 0.9 %  sodium chloride infusion, Transfuse platelet pheresis, CBC with Differential ([place] Only), CANCELED: Type and screen, CANCELED: Type and screen  Diffuse large B-cell lymphoma, unspecified body region ([place]) - Plan: CBC with Differential ([place] Only)   Thrombocytopenia: A CBC returned today with a platelet count of 5.  The patient was given 1 unit of platelets and will return on 07/30/2019 a repeat CBC and for platelets if needed.  Diffuse large B-cell lymphoma: The patient is status post cycle 6 of R-CHOP which was given on 07/18/2019.  She is to follow-up with Dr. Maylon Peppers on 08/01/2019.  His CBC returned today with a WBC of 0.6, hemoglobin of 8.8, hematocrit at 27.2, and platelets at 5.  Please see After Visit Summary for patient specific instructions.  Future Appointments  Date Time Provider [place]  07/30/2019 12:00 PM [facility] 3 [facility] None  07/30/2019 12:30 PM Tanner, Lucianne Lei E., PA-C [facility] None  07/30/2019  2:00 PM [facility] PET CT 1 [facility] [place]  08/01/2019   8:45 AM [facility] LAB 6 [facility] None  08/01/2019  9:00 AM [facility] [place] None  08/01/2019  9:30 AM Tish Men, MD [facility] None  08/08/2019  2:30 PM Jennet Maduro, RD [facility] None    Orders Placed This Encounter  Procedures  . CBC with Differential ([place] Only)  . Practitioner attestation of consent  . Complete patient signature process for consent form  . Care order/instruction  . Prepare Pheresed Platelets  . ABO/Rh       Subjective:   Patient ID:  Sandra Payne is a 78 y.o. (DOB 11-19-1940) female.  Chief Complaint:  Chief Complaint  Patient presents with  . Fatigue    HPI Sandra Payne  Is a 78 y.o. female with a diagnosis of a diffuse large B-cell lymphoma. She is status post cycle 6 of R-CHOP which was given on 07/18/2019.   She presents to the clinic today with a report of increasing fatigue and weakness along with mild nausea.  She denies fevers, chills, sweats, vomiting, constipation, or diarrhea.  She also has no evidence of bleeding.  Medications: I have reviewed the patient's current medications.  Allergies:  Allergies  Allergen Reactions  . Codeine Itching  . Sulfa Antibiotics Other (See Comments)    Childhood allergy    Past Medical History:  Diagnosis Date  . Anemia   . Arthralgia   . Arthritis   . Elevated cholesterol   . Insomnia   . Lymphoma ([place])   . Neck mass    left    Past Surgical History:  Procedure Laterality Date  . ABDOMINAL HYSTERECTOMY     partial  . IR IMAGING GUIDED PORT INSERTION  03/28/2019  . MASS BIOPSY Left 03/12/2019   Procedure: NECK MASS BIOPSY;  Surgeon: Izora Gala, MD;  Location: [facility];  Service: ENT;  Laterality: Left;  . 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 Diabetes Mother    . Hypertension Mother   . Breast cancer Neg Hx     Social History   Socioeconomic History  . Marital status: Widowed    Spouse name: Not on file  . Number of children: 1  . Years of education: Not on file  . Highest education level: Not on file  Occupational History  . Not on file  Social Needs  . Financial resource strain: Not on file  . Food insecurity    Worry: Not on file    Inability: Not on file  . Transportation needs    Medical: No    Non-medical: No  Tobacco Use  . Smoking status: Former Smoker    Packs/day: 0.25    Years: 2.00    Pack years: 0.50    Types: Cigarettes  . Smokeless tobacco: Never Used  . Tobacco comment: she quit 30-40 years ago.   Substance and Sexual Activity  . Alcohol use: Yes    Alcohol/week: 0.0 standard drinks    Comment: occasionally  . Drug use: No  . Sexual activity: Not on file  Lifestyle  . Physical activity    Days per week: Not on file    Minutes per session: Not on file  . Stress: Not on file  Relationships  . Social Herbalist on phone: Not on file    Gets together: Not on file    Attends religious service: Not on file    Active member of club or organization: Not on file    Attends meetings of clubs or organizations: Not on file    Relationship status: Not on file  . Intimate partner violence    Fear of current or ex partner: No    Emotionally abused: No    Physically abused: No    Forced sexual activity: No  Other Topics Concern  . Not on file  Social History Narrative   Lives alone in a 2 story home.  Husband passed away on 09/10/2014.     Works one day a week at [employer].     Retired from [employer].  Exercises regularly.    Patient is right-handed.    Past Medical History, Surgical history, Social history, and Family history were reviewed and updated as appropriate.   Please see review of systems for further details on the patient's review from today.    Review of Systems:  Review of Systems  Constitutional: Positive for fatigue. Negative for appetite change, chills, diaphoresis and fever.  HENT: Negative for dental problem, mouth sores and trouble swallowing.   Respiratory: Negative for cough, chest tightness and shortness of breath.   Cardiovascular: Negative for chest pain and palpitations.  Gastrointestinal: Positive for nausea. Negative for constipation, diarrhea and vomiting.  Neurological: Positive for weakness. Negative for dizziness, syncope and headaches.    Objective:   Physical Exam:  BP 100/68   Pulse 93   Temp 98.2 F (36.8 C) (Oral)   Resp 18   Ht 4\' 10"  (1.473 m)  Wt 121 lb (54.9 kg)   SpO2 100%   BMI 25.29 kg/m  ECOG: 0  Oxygen Saturation:  Room air at rest:    100 % on room air  Pulse: 107 Room air with ambulation:   100 % on room air  Pulse: 137  Physical Exam Constitutional:      General: She is not in acute distress.    Appearance: She is not diaphoretic.  HENT:     Head: Normocephalic and atraumatic.  Eyes:     General: No scleral icterus.       Right eye: No discharge.        Left eye: No discharge.     Conjunctiva/sclera: Conjunctivae normal.  Cardiovascular:     Rate and Rhythm: Regular rhythm. Tachycardia present.     Heart sounds: Normal heart sounds. No murmur. No friction rub. No gallop.   Pulmonary:     Effort: Pulmonary effort is normal. No respiratory distress.     Breath sounds: Normal breath sounds. No wheezing or rales.  Skin:    General: Skin is warm and dry.     Findings: No erythema or rash.  Neurological:     Mental Status: She is alert.     Coordination: Coordination normal.     Gait: Gait normal.  Psychiatric:        Mood and Affect: Mood normal.        Behavior: Behavior normal.        Thought Content: Thought content normal.        Judgment: Judgment normal.     Lab Review:     Component Value Date/Time   NA 139 07/26/2019 0838   NA 137 01/16/2015   K 3.8  07/26/2019 0838   CL 103 07/26/2019 0838   CO2 27 07/26/2019 0838   GLUCOSE 103 (H) 07/26/2019 0838   BUN 8 07/26/2019 0838   BUN 12 01/16/2015   CREATININE 0.59 07/26/2019 0838   CALCIUM 8.9 07/26/2019 0838   PROT 6.0 (L) 07/26/2019 0838   ALBUMIN 3.5 07/26/2019 0838   AST 11 (L) 07/26/2019 0838   ALT 12 07/26/2019 0838   ALKPHOS 52 07/26/2019 0838   BILITOT 0.6 07/26/2019 0838   GFRNONAA >60 07/26/2019 0838   GFRAA >60 07/26/2019 0838       Component Value Date/Time   WBC 0.6 (LL) 07/26/2019 0838   WBC 0.5 (LL) 07/04/2019 0759   RBC 2.98 (L) 07/26/2019 0838   HGB 8.8 (L) 07/26/2019 0838   HCT 27.2 (L) 07/26/2019 0838   PLT 5 (LL) 07/26/2019 0838   MCV 91.3 07/26/2019 0838   MCH 29.5 07/26/2019 0838   MCHC 32.4 07/26/2019 0838   RDW 19.2 (H) 07/26/2019 0838   LYMPHSABS 0.1 (L) 07/26/2019 0838   MONOABS 0.1 07/26/2019 0838   EOSABS 0.0 07/26/2019 0838   BASOSABS 0.0 07/26/2019 0838   -------------------------------  Imaging from last 24 hours (if applicable):  Radiology interpretation: Dg Abdomen 1 View  Result Date: 07/04/2019 CLINICAL DATA:  Nausea and constipation EXAM: ABDOMEN - 1 VIEW COMPARISON:  05/04/2019 FINDINGS: The bowel gas pattern is normal. No radio-opaque calculi or other significant radiographic abnormality are seen. Mild-to-moderate stool in the colon. Degenerative changes in the lower lumbar spine IMPRESSION: Mild-to-moderate stool in the colon. Nonobstructive bowel gas pattern. Electronically Signed   By: Franchot Gallo M.D.   On: 07/04/2019 08:23

## 2019-07-27 NOTE — Telephone Encounter (Signed)
Returned pt's VM requesting a time for f/u appt with PA Lucianne Lei in [facility] on 07/30/2019.  Pt advised of lab appt at 1200 and [facility] appt at 1230.  Pt agreed to appt times on Monday, is aware of imaging appt at 1400 that afternoon as well.  Denies any further questions or concerns at this time.  High priority scheduling message sent and orders placed for labs.

## 2019-07-30 ENCOUNTER — Other Ambulatory Visit: Payer: Self-pay

## 2019-07-30 ENCOUNTER — Inpatient Hospital Stay (HOSPITAL_BASED_OUTPATIENT_CLINIC_OR_DEPARTMENT_OTHER): Payer: PPO | Admitting: Medical

## 2019-07-30 ENCOUNTER — Inpatient Hospital Stay: Payer: PPO

## 2019-07-30 ENCOUNTER — Encounter (HOSPITAL_COMMUNITY)
Admission: RE | Admit: 2019-07-30 | Discharge: 2019-07-30 | Disposition: A | Discharge status: Home / Self Care | Payer: PPO | Source: Ambulatory Visit | Attending: Hematology | Admitting: Hematology

## 2019-07-30 DIAGNOSIS — Z95828 Presence of other vascular implants and grafts: Secondary | ICD-10-CM

## 2019-07-30 DIAGNOSIS — C8330 Diffuse large B-cell lymphoma, unspecified site: Secondary | ICD-10-CM

## 2019-07-30 DIAGNOSIS — I700 Atherosclerosis of aorta: Secondary | ICD-10-CM | POA: Insufficient documentation

## 2019-07-30 DIAGNOSIS — D696 Thrombocytopenia, unspecified: Secondary | ICD-10-CM

## 2019-07-30 DIAGNOSIS — R918 Other nonspecific abnormal finding of lung field: Secondary | ICD-10-CM | POA: Diagnosis not present

## 2019-07-30 DIAGNOSIS — C8331 Diffuse large B-cell lymphoma, lymph nodes of head, face, and neck: Secondary | ICD-10-CM

## 2019-07-30 DIAGNOSIS — I2510 Atherosclerotic heart disease of native coronary artery without angina pectoris: Secondary | ICD-10-CM | POA: Diagnosis not present

## 2019-07-30 LAB — CBC WITH DIFFERENTIAL (CANCER CENTER ONLY)
Abs Immature Granulocytes: 0.38 10*3/uL — ABNORMAL HIGH (ref 0.00–0.07)
Basophils Absolute: 0.1 10*3/uL (ref 0.0–0.1)
Basophils Relative: 1 %
Eosinophils Absolute: 0 10*3/uL (ref 0.0–0.5)
Eosinophils Relative: 1 %
HCT: 26.5 % — ABNORMAL LOW (ref 36.0–46.0)
Hemoglobin: 8.5 g/dL — ABNORMAL LOW (ref 12.0–15.0)
Immature Granulocytes: 5 %
Lymphocytes Relative: 7 %
Lymphs Abs: 0.5 10*3/uL — ABNORMAL LOW (ref 0.7–4.0)
MCH: 29.7 pg (ref 26.0–34.0)
MCHC: 32.1 g/dL (ref 30.0–36.0)
MCV: 92.7 fL (ref 80.0–100.0)
Monocytes Absolute: 0.8 10*3/uL (ref 0.1–1.0)
Monocytes Relative: 12 %
Neutro Abs: 5.3 10*3/uL (ref 1.7–7.7)
Neutrophils Relative %: 74 %
Platelet Count: 71 10*3/uL — ABNORMAL LOW (ref 150–400)
RBC: 2.86 MIL/uL — ABNORMAL LOW (ref 3.87–5.11)
RDW: 19.6 % — ABNORMAL HIGH (ref 11.5–15.5)
WBC Count: 7.1 10*3/uL (ref 4.0–10.5)
nRBC: 1.7 % — ABNORMAL HIGH (ref 0.0–0.2)

## 2019-07-30 LAB — CMP (CANCER CENTER ONLY)
ALT: 12 U/L (ref 0–44)
AST: 16 U/L (ref 15–41)
Albumin: 3.6 g/dL (ref 3.5–5.0)
Alkaline Phosphatase: 65 U/L (ref 38–126)
Anion gap: 9 (ref 5–15)
BUN: 7 mg/dL — ABNORMAL LOW (ref 8–23)
CO2: 26 mmol/L (ref 22–32)
Calcium: 8.8 mg/dL — ABNORMAL LOW (ref 8.9–10.3)
Chloride: 104 mmol/L (ref 98–111)
Creatinine: 0.65 mg/dL (ref 0.44–1.00)
GFR, Est AFR Am: >60 mL/min (ref >60)
GFR, Estimated: >60 mL/min (ref >60)
Glucose, Bld: 89 mg/dL (ref 70–99)
Potassium: 3.6 mmol/L (ref 3.5–5.1)
Sodium: 139 mmol/L (ref 135–145)
Total Bilirubin: <0.2 mg/dL — ABNORMAL LOW (ref 0.3–1.2)
Total Protein: 6.2 g/dL — ABNORMAL LOW (ref 6.5–8.1)

## 2019-07-30 LAB — SAMPLE TO BLOOD BANK

## 2019-07-30 LAB — MAGNESIUM: Magnesium: 2 mg/dL (ref 1.7–2.4)

## 2019-07-30 LAB — GLUCOSE, CAPILLARY: Glucose-Capillary: 87 mg/dL (ref 70–99)

## 2019-07-30 MED ORDER — FLUDEOXYGLUCOSE F - 18 (FDG) INJECTION
6.0900 | Freq: Once | INTRAVENOUS | Status: AC | PRN
  Administered 2019-07-30: 6.09 via INTRAVENOUS

## 2019-07-30 MED ORDER — SODIUM CHLORIDE 0.9% FLUSH
10.0000 mL | Freq: Once | INTRAVENOUS | Status: AC
  Administered 2019-07-30: 10 mL
  Filled 2019-07-30: qty 10

## 2019-07-30 NOTE — Progress Notes (Signed)
Per [place] in [place] pt's port was deaccessed before starting her PET scan.  Per PA Lucianne Lei d/t pt's labs being within range and the patient having another appt with [facility] for PET scan today pt does not need to come to [facility] after imaging appt today (pt originally scheduled to come before her PET scan but d/t time & space constraints pt went to get scan while waiting for labs to result).  Pt made aware of lab results and that she may leave after scan appt by PA Lucianne Lei via telephone call.

## 2019-07-30 NOTE — Patient Instructions (Signed)

## 2019-07-31 NOTE — Progress Notes (Signed)
Sandra Payne was called with her lab results. She did not need to be seen today. She will return as scheduled on 08/01/2019.  Sandi Mealy, MHS, PA-C Physician Assistant

## 2019-08-01 ENCOUNTER — Encounter: Payer: Self-pay | Admitting: Hematology

## 2019-08-01 ENCOUNTER — Other Ambulatory Visit: Payer: Self-pay

## 2019-08-01 ENCOUNTER — Telehealth: Payer: Self-pay | Admitting: Hematology

## 2019-08-01 ENCOUNTER — Inpatient Hospital Stay: Payer: PPO

## 2019-08-01 ENCOUNTER — Inpatient Hospital Stay (HOSPITAL_BASED_OUTPATIENT_CLINIC_OR_DEPARTMENT_OTHER): Payer: PPO | Admitting: Hematology

## 2019-08-01 VITALS — BP 121/60 | HR 101 | Temp 97.3°F | Resp 16 | Ht <= 58 in | Wt 121.9 lb

## 2019-08-01 DIAGNOSIS — R5383 Other fatigue: Secondary | ICD-10-CM | POA: Diagnosis not present

## 2019-08-01 DIAGNOSIS — R531 Weakness: Secondary | ICD-10-CM

## 2019-08-01 DIAGNOSIS — D6481 Anemia due to antineoplastic chemotherapy: Secondary | ICD-10-CM

## 2019-08-01 DIAGNOSIS — D696 Thrombocytopenia, unspecified: Secondary | ICD-10-CM | POA: Diagnosis not present

## 2019-08-01 DIAGNOSIS — T451X5A Adverse effect of antineoplastic and immunosuppressive drugs, initial encounter: Secondary | ICD-10-CM

## 2019-08-01 DIAGNOSIS — Z791 Long term (current) use of non-steroidal anti-inflammatories (NSAID): Secondary | ICD-10-CM

## 2019-08-01 DIAGNOSIS — Z87891 Personal history of nicotine dependence: Secondary | ICD-10-CM | POA: Diagnosis not present

## 2019-08-01 DIAGNOSIS — R200 Anesthesia of skin: Secondary | ICD-10-CM | POA: Diagnosis not present

## 2019-08-01 DIAGNOSIS — Z95828 Presence of other vascular implants and grafts: Secondary | ICD-10-CM

## 2019-08-01 DIAGNOSIS — Z79899 Other long term (current) drug therapy: Secondary | ICD-10-CM | POA: Diagnosis not present

## 2019-08-01 DIAGNOSIS — C8330 Diffuse large B-cell lymphoma, unspecified site: Secondary | ICD-10-CM | POA: Diagnosis not present

## 2019-08-01 DIAGNOSIS — R110 Nausea: Secondary | ICD-10-CM

## 2019-08-01 DIAGNOSIS — C8331 Diffuse large B-cell lymphoma, lymph nodes of head, face, and neck: Secondary | ICD-10-CM

## 2019-08-01 DIAGNOSIS — G620 Drug-induced polyneuropathy: Secondary | ICD-10-CM

## 2019-08-01 DIAGNOSIS — D6959 Other secondary thrombocytopenia: Secondary | ICD-10-CM

## 2019-08-01 LAB — CMP (CANCER CENTER ONLY)
ALT: 12 U/L (ref 0–44)
AST: 16 U/L (ref 15–41)
Albumin: 3.4 g/dL — ABNORMAL LOW (ref 3.5–5.0)
Alkaline Phosphatase: 69 U/L (ref 38–126)
Anion gap: 8 (ref 5–15)
BUN: 6 mg/dL — ABNORMAL LOW (ref 8–23)
CO2: 26 mmol/L (ref 22–32)
Calcium: 8.5 mg/dL — ABNORMAL LOW (ref 8.9–10.3)
Chloride: 106 mmol/L (ref 98–111)
Creatinine: 0.67 mg/dL (ref 0.44–1.00)
GFR, Est AFR Am: >60 mL/min (ref >60)
GFR, Estimated: >60 mL/min (ref >60)
Glucose, Bld: 102 mg/dL — ABNORMAL HIGH (ref 70–99)
Potassium: 3.4 mmol/L — ABNORMAL LOW (ref 3.5–5.1)
Sodium: 140 mmol/L (ref 135–145)
Total Bilirubin: <0.2 mg/dL — ABNORMAL LOW (ref 0.3–1.2)
Total Protein: 6 g/dL — ABNORMAL LOW (ref 6.5–8.1)

## 2019-08-01 LAB — CBC WITH DIFFERENTIAL (CANCER CENTER ONLY)
Abs Immature Granulocytes: 0.31 10*3/uL — ABNORMAL HIGH (ref 0.00–0.07)
Basophils Absolute: 0.1 10*3/uL (ref 0.0–0.1)
Basophils Relative: 1 %
Eosinophils Absolute: 0 10*3/uL (ref 0.0–0.5)
Eosinophils Relative: 0 %
HCT: 26.4 % — ABNORMAL LOW (ref 36.0–46.0)
Hemoglobin: 8.5 g/dL — ABNORMAL LOW (ref 12.0–15.0)
Immature Granulocytes: 4 %
Lymphocytes Relative: 6 %
Lymphs Abs: 0.4 10*3/uL — ABNORMAL LOW (ref 0.7–4.0)
MCH: 30.4 pg (ref 26.0–34.0)
MCHC: 32.2 g/dL (ref 30.0–36.0)
MCV: 94.3 fL (ref 80.0–100.0)
Monocytes Absolute: 1.1 10*3/uL — ABNORMAL HIGH (ref 0.1–1.0)
Monocytes Relative: 14 %
Neutro Abs: 5.6 10*3/uL (ref 1.7–7.7)
Neutrophils Relative %: 75 %
Platelet Count: 117 10*3/uL — ABNORMAL LOW (ref 150–400)
RBC: 2.8 MIL/uL — ABNORMAL LOW (ref 3.87–5.11)
RDW: 20.4 % — ABNORMAL HIGH (ref 11.5–15.5)
WBC Count: 7.5 10*3/uL (ref 4.0–10.5)
nRBC: 0.9 % — ABNORMAL HIGH (ref 0.0–0.2)

## 2019-08-01 MED ORDER — SODIUM CHLORIDE 0.9% FLUSH
10.0000 mL | INTRAVENOUS | Status: DC | PRN
  Administered 2019-08-01: 10 mL via INTRAVENOUS
  Filled 2019-08-01: qty 10

## 2019-08-01 MED ORDER — HEPARIN SOD (PORK) LOCK FLUSH 100 UNIT/ML IV SOLN
500.0000 [IU] | Freq: Once | INTRAVENOUS | Status: AC
  Administered 2019-08-01: 09:00:00 500 [IU] via INTRAVENOUS
  Filled 2019-08-01: qty 5

## 2019-08-01 NOTE — Telephone Encounter (Signed)
Scheduled appt per 12/9 los -gave patient AVS and calender -

## 2019-08-01 NOTE — Patient Instructions (Signed)

## 2019-08-01 NOTE — Progress Notes (Signed)
Met with patient whom brought proof of income for one-time $700 [employer].  Patient approved for the grant. She has a copy of the award letter and expense sheet as well as the Outpatient pharmacy information. Discussed in detail expenses and how they are submitted. She verbalized understanding. She received a gas card today from her grant.  She has my card for any additional financial questions or concerns.

## 2019-08-01 NOTE — Progress Notes (Signed)
[place] OFFICE PROGRESS NOTE  Patient Care Team: Jonathon Jordan, MD as PCP - General (Family Medicine) Tish Men, MD as Consulting Physician (Hematology) Eppie Gibson, MD as Attending Physician (Radiation Oncology) Leota Sauers, RN as Oncology Nurse Navigator Jennet Maduro, [place] as Dietitian (Dietician) Izora Gala, MD as Consulting Physician (Otolaryngology)  HEME/ONC OVERVIEW: 1. 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 BCL2 rearrangement positive, no BCL6 or Myc rearrangement -03/2019: left supraclavicular (bulky), left axillary, RP, mesenteric and iliac adenopathy on PET; bone marrow bx negative for lymphoma  -Mid-03/2019 - late 06/2019: R-CHOP with G-CSF support   EOT PET showed decreased but residual left thoracic inlet LN disease (Deauville 5); resolution of other sites of disease   2. 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  There was no evidence of new disease. -I discussed the results in detail with the patient -In light of the residual disease, I recommend proceeding with ISRT as previously recommended in the tumor board -In absence of progressive disease, I do not think further chemotherapy is indicated at this time -Once she completes ISRT, we will plan to repeat  one more PET in ~8 weeks to confirm resolution of the residual disease -I discussed the case in detail with Dr. 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  All questions were answered. The patient knows to call the clinic with any problems, questions or concerns. No barriers to learning was detected.  Return in 4 weeks for labs, port flush and clinic appt.    Tish Men, MD 08/01/2019 10:03 AM  CHIEF COMPLAINT: "I am doing okay"  INTERVAL HISTORY: Sandra Payne returns to clinic for follow-up of DLBCL s/p 6 cycles of R-CHOP.  Patient reports that she has had intermittent nausea, for which she took antiemetics last week with some relief.  She still has some mild nausea this week, but denies any associated vomiting and has not had to take any antihypertensive emetics.  She also still has some very mild numbness sensation in the fingertips, which is stable and not interfering with ADLs.  She denies any other complaint today.  REVIEW OF SYSTEMS:   Constitutional: ( - ) fevers, ( - )  chills , ( - )  night sweats Eyes: ( - ) blurriness of vision, ( - ) double vision, ( - ) watery eyes Ears, nose, mouth, throat, and face: ( - ) mucositis, ( - ) sore  throat Respiratory: ( - ) cough, ( - ) dyspnea, ( - ) wheezes Cardiovascular: ( - ) palpitation, ( - ) chest discomfort, ( - ) lower extremity swelling Gastrointestinal:  ( + ) nausea, ( - ) heartburn, ( - ) change in bowel habits Skin: ( - ) abnormal skin rashes Lymphatics: ( - ) new lymphadenopathy, ( - ) easy bruising Neurological: ( + ) numbness, ( - ) tingling, ( - ) new weaknesses Behavioral/Psych: ( - ) mood change, ( - ) new changes  All other systems were reviewed with the patient and are negative.  SUMMARY OF ONCOLOGIC HISTORY: Oncology History  DLBCL (diffuse large B cell lymphoma) ([place])  02/19/2019 Imaging   CT neck: IMPRESSION: 1. 7 cm left lower neck mass extending into the superior mediastinum most concerning for malignancy. This may reflect a conglomerate nodal mass or primary tumor of uncertain origin. 2. Additional enlarged lymph nodes adjacent to the mass in the left supraclavicular/retroclavicular and left subpectoral regions. 3.  Aortic Atherosclerosis (ICD10-I70.0).   03/12/2019 Procedure   Incisional left cervical LN bx by Dr. Constance Holster   03/12/2019 Pathology Results   Accession: [id number] Soft tissue mass, simple excision, Left Neck - DIFFUSE LARGE B-CELL LYMPHOMA - SEE COMMENT   03/19/2019 Initial Diagnosis   DLBCL (diffuse large B cell lymphoma) ([place])   03/29/2019 Imaging   PET: IMPRESSION: 1. The large left supraclavicular mass is intensely hypermetabolic compatible with history of lymphoma. There are also nearby FDG avid left supraclavicular, left axillary, and left retroperitoneal lymph nodes. 2. Hypermetabolic retroperitoneal, mesenteric and iliac adenopathy. 3. Aortic Atherosclerosis (ICD10-I70.0). Coronary artery calcifications.   04/04/2019 -  Chemotherapy   The patient had DOXOrubicin (ADRIAMYCIN) chemo injection 80 mg, 50 mg/m2 = 80 mg, Intravenous,  Once, 6 of 6 cycles Administration: 80 mg (04/04/2019), 80 mg (04/25/2019), 80 mg (05/16/2019), 80  mg (06/06/2019), 80 mg (06/27/2019), 80 mg (07/18/2019) palonosetron (ALOXI) injection 0.25 mg, 0.25 mg, Intravenous,  Once, 6 of 6 cycles Administration: 0.25 mg (04/04/2019), 0.25 mg (04/25/2019), 0.25 mg (05/16/2019), 0.25 mg (06/06/2019), 0.25 mg (06/27/2019), 0.25 mg (07/18/2019) pegfilgrastim-jmdb (FULPHILA) injection 6 mg, 6 mg, Subcutaneous,  Once, 6 of 6 cycles Administration: 6 mg (04/06/2019), 6 mg (04/27/2019), 6 mg (05/18/2019), 6 mg (06/08/2019), 6 mg (06/29/2019), 6 mg (07/20/2019) vinCRIStine (ONCOVIN) 2 mg in sodium chloride 0.9 % 50 mL chemo infusion, 2 mg, Intravenous,  Once, 6 of 6 cycles Administration: 2 mg (04/04/2019), 2 mg (04/25/2019), 2 mg (05/16/2019), 2 mg (06/06/2019), 2 mg (06/27/2019), 2 mg (07/18/2019) riTUXimab (RITUXAN) 600 mg in sodium chloride 0.9 % 250 mL (1.9355 mg/mL) infusion, 375 mg/m2 = 600 mg, Intravenous,  Once, 1 of 1 cycle Administration: 600 mg (04/04/2019) cyclophosphamide (CYTOXAN) 1,180 mg in sodium chloride 0.9 % 250 mL chemo infusion, 750 mg/m2 = 1,180 mg, Intravenous,  Once, 6 of 6 cycles Dose modification: 600 mg/m2 (original dose 750 mg/m2, Cycle 6, Reason: Dose not tolerated) Administration: 1,180 mg (04/04/2019), 1,180 mg (04/25/2019), 1,180 mg (05/16/2019), 1,180 mg (06/06/2019), 1,180 mg (06/27/2019), 940 mg (07/18/2019) riTUXimab (RITUXAN) 600 mg in sodium chloride 0.9 % 190 mL infusion, 375 mg/m2 = 600 mg, Intravenous,  Once, 5 of 5 cycles Administration: 600 mg (04/25/2019), 600 mg (05/16/2019), 600 mg (06/06/2019), 600 mg (06/27/2019), 600 mg (07/18/2019) fosaprepitant (EMEND) 150  mg, dexamethasone (DECADRON) 12 mg in sodium chloride 0.9 % 145 mL IVPB, , Intravenous,  Once, 6 of 6 cycles Administration:  (04/04/2019),  (04/25/2019),  (05/16/2019),  (06/06/2019),  (06/27/2019),  (07/18/2019)  for chemotherapy treatment.    06/12/2019 Imaging   PET (after 4 cycles of R-CHOP): IMPRESSION: 1. Partial treatment response. Dominant left supraclavicular nodal mass is  decreased in size and metabolism, with persistent Deauville score 5 uptake. Previously visualized hypermetabolic left retropectoral, retroperitoneal and lower mesenteric lymph nodes have resolved. 2. Stable low level splenic hypermetabolism, nonspecific, possibly reactive. Normal size spleen. No splenic masses. 3. Low level hypermetabolism throughout the skeleton, compatible with mildly stimulated marrow state. 4. Asymmetric right glottic uptake without CT mass correlate, cannot exclude left focal cord paralysis. 5.  Aortic Atherosclerosis (ICD10-I70.0).   07/30/2019 Imaging   EOT PET: IMPRESSION: 1. Partial metabolic response to therapy of nodal mass within the left side of the thoracic inlet. (Deauville) 5. 2. No new sites of disease identified. 3. Posterior right upper lobe ground-glass nodule versus area of subsegmental atelectasis. Recommend attention on follow-up. 4. 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 5. Coronary artery atherosclerosis. Aortic Atherosclerosis (ICD10-I70.0).   08/01/2019 Cancer Staging   Staging form: Hodgkin and Non-Hodgkin Lymphoma, AJCC 8th Edition - Clinical: Stage III - Signed by Tish Men, MD on 08/01/2019     I have reviewed the past medical history, past surgical history, social history and family history with the patient and they are unchanged from previous note.  ALLERGIES:  is allergic to codeine and sulfa antibiotics.  MEDICATIONS:  Current Outpatient Medications  Medication Sig Dispense Refill  . acetaminophen (TYLENOL) 325 MG tablet Take 650 mg by mouth every 6 (six) hours as needed for mild pain or headache.     . albuterol (VENTOLIN HFA) 108 (90 Base) MCG/ACT inhaler Inhale 2 puffs into the lungs every 6 (six) hours as needed.    [name] Kitchen allopurinol (ZYLOPRIM) 300 MG tablet Take 1 tablet (300 mg total) by mouth daily. 30 tablet 3  . celecoxib (CELEBREX) 200 MG capsule Take 200 mg by mouth every other day.    .  DULoxetine (CYMBALTA) 30 MG capsule 1 CAPSULE ONCE A DAY DAILY, MAY INCREASE TO TWICE A DAY AFTER 7 DAYS IF NOT BETTER    . metoCLOPramide (REGLAN) 10 MG tablet Take 1 tablet (10 mg total) by mouth every 8 (eight) hours as needed for nausea. 40 tablet 0  . Multiple Vitamin (MULTIVITAMIN) tablet Take 1 tablet by mouth daily.    . polyvinyl alcohol (LIQUIFILM TEARS) 1.4 % ophthalmic solution Place 1 drop into both eyes as needed for dry eyes (allergies).    . prochlorperazine (COMPAZINE) 10 MG tablet Take 1 tablet (10 mg total) by mouth every 6 (six) hours as needed (Nausea or vomiting). 30 tablet 6  . traMADol (ULTRAM) 50 MG tablet Take 50 mg by mouth every 6 (six) hours as needed for moderate pain.     [name] Kitchen zolpidem (AMBIEN) 10 MG tablet TAKE 0.5  1 TABLET AT BEDTIME AS NEEDED    . gabapentin (NEURONTIN) 400 MG capsule Take 1 capsule (400 mg total) by mouth 2 (two) times daily. 60 capsule 2  . HYDROcodone-acetaminophen (NORCO) 7.5-325 MG tablet Take 1 tablet by mouth every 6 (six) hours as needed for moderate pain. (Patient not taking: Reported on 06/27/2019) 120 tablet 0  . lidocaine-prilocaine (EMLA) cream Apply to affected area once (Patient not taking: Reported on 06/27/2019) 30 g 3  .  LORazepam (ATIVAN) 0.5 MG tablet Take 1 tablet (0.5 mg total) by mouth 2 (two) times daily as needed (for refractory nausea and vomiting). (Patient not taking: Reported on 07/18/2019) 30 tablet 0  . ondansetron (ZOFRAN) 8 MG tablet Take 1 tablet (8 mg total) by mouth 2 (two) times daily as needed for refractory nausea / vomiting. Start on day 3 after cyclophosphamide chemotherapy. (Patient not taking: Reported on 07/10/2019) 30 tablet 1  . pantoprazole (PROTONIX) 20 MG tablet Take 1 tablet (20 mg total) by mouth daily. 30 tablet 5  . pegfilgrastim (NEULASTA ONPRO KIT) 6 MG/0.6ML injection Inject 6 mg into the skin as directed. Inject via provided programmed delivery device.  Every 3 weeks    . potassium chloride SA  (KLOR-CON) 20 MEQ tablet Take 1 tablet (20 mEq total) by mouth daily. (Patient not taking: Reported on 08/01/2019) 10 tablet 0  . predniSONE (DELTASONE) 20 MG tablet Take 100 mg by mouth as directed. Take 5 tablets (146m) by mouth daily for 5 days. Take on days 1-5 of chemotherapy      No current facility-administered medications for this visit.     PHYSICAL EXAMINATION: ECOG PERFORMANCE STATUS: 2 - Symptomatic, <50% confined to bed  Today's Vitals   08/01/19 0937 08/01/19 0940  BP: 121/60   Pulse: (!) 101   Resp: 16   Temp: (!) 97.3 F (36.3 C)   TempSrc: Temporal   SpO2: 99%   Weight: 121 lb 14.4 oz (55.3 kg)   Height: _0  (1.473 m)   PainSc:  0-No pain   Body mass index is 25.48 kg/m.  Filed Weights   08/01/19 0937  Weight: 121 lb 14.4 oz (55.3 kg)    GENERAL: alert, no distress and comfortable SKIN: skin color, texture, turgor are normal, no rashes or significant lesions EYES: conjunctiva are pink and non-injected, sclera clear OROPHARYNX: no exudate, no erythema; lips, buccal mucosa, and tongue normal  NECK: supple, non-tender LYMPH:  Improving but still palpable left supraclavicular LN LUNGS: clear to auscultation with normal breathing effort HEART: regular rate & rhythm and no murmurs and no lower extremity edema ABDOMEN: soft, non-tender, non-distended, normal bowel sounds Musculoskeletal: no cyanosis of digits and no clubbing  PSYCH: alert & oriented x 3, fluent speech  LABORATORY DATA:  I have reviewed the data as listed    Component Value Date/Time   NA 139 07/30/2019 1245   NA 137 01/16/2015   K 3.6 07/30/2019 1245   CL 104 07/30/2019 1245   CO2 26 07/30/2019 1245   GLUCOSE 89 07/30/2019 1245   BUN 7 (L) 07/30/2019 1245   BUN 12 01/16/2015   CREATININE 0.65 07/30/2019 1245   CALCIUM 8.8 (L) 07/30/2019 1245   PROT 6.2 (L) 07/30/2019 1245   ALBUMIN 3.6 07/30/2019 1245   AST 16 07/30/2019 1245   ALT 12 07/30/2019 1245   ALKPHOS 65 07/30/2019 1245    BILITOT <0.2 (L) 07/30/2019 1245   GFRNONAA >60 07/30/2019 1245   GFRAA >60 07/30/2019 1245    No results found for: SPEP, UPEP  Lab Results  Component Value Date   WBC 7.5 08/01/2019   NEUTROABS 5.6 08/01/2019   HGB 8.5 (L) 08/01/2019   HCT 26.4 (L) 08/01/2019   MCV 94.3 08/01/2019   PLT 117 (L) 08/01/2019      Chemistry      Component Value Date/Time   NA 139 07/30/2019 1245   NA 137 01/16/2015   K 3.6 07/30/2019 1245   CL  104 07/30/2019 1245   CO2 26 07/30/2019 1245   BUN 7 (L) 07/30/2019 1245   BUN 12 01/16/2015   CREATININE 0.65 07/30/2019 1245   GLU 96 01/16/2015      Component Value Date/Time   CALCIUM 8.8 (L) 07/30/2019 1245   ALKPHOS 65 07/30/2019 1245   AST 16 07/30/2019 1245   ALT 12 07/30/2019 1245   BILITOT <0.2 (L) 07/30/2019 1245       RADIOGRAPHIC STUDIES: I have personally reviewed the radiological images as listed below and agreed with the findings in the report. Dg Abdomen 1 View  Result Date: 07/04/2019 CLINICAL DATA:  Nausea and constipation EXAM: ABDOMEN - 1 VIEW COMPARISON:  05/04/2019 FINDINGS: The bowel gas pattern is normal. No radio-opaque calculi or other significant radiographic abnormality are seen. Mild-to-moderate stool in the colon. Degenerative changes in the lower lumbar spine IMPRESSION: Mild-to-moderate stool in the colon. Nonobstructive bowel gas pattern. Electronically Signed   By: Franchot Gallo M.D.   On: 07/04/2019 08:23   Pet, Restage (skull Base To Thigh)  Result Date: 07/30/2019 CLINICAL DATA:  Subsequent treatment strategy for diffuse large B-cell lymphoma. Evaluate therapy response. EXAM: NUCLEAR MEDICINE PET SKULL BASE TO THIGH TECHNIQUE: 6.1 mCi F-18 FDG was injected intravenously. Full-ring PET imaging was performed from the skull base to thigh after the radiotracer. CT data was obtained and used for attenuation correction and anatomic localization. Fasting blood glucose: 87 mg/dl COMPARISON:  06/12/2019 FINDINGS:  Mediastinal blood pool activity: SUV max 2.1 Liver activity: SUV max 3.7 NECK: Asymmetric right-sided vocal cord activity, again suspicious for left-sided cord paralysis. No cervical nodal hypermetabolism. Incidental CT findings: Left carotid atherosclerosis. CHEST: Hypermetabolic mass in the left side of the thoracic inlet measures 3.3 x 3.2 cm and a S.U.V. max of 11.9 today. Compare 3.8 x 3.6 cm and a S.U.V. max of 17.2 on the prior. No other thoracic nodal hypermetabolism. Incidental CT findings: Right Port-A-Cath tip at high right atrium. Aortic and coronary artery atherosclerosis. Posterior right upper lobe ground-glass opacity of 5 mm on 21/8, new. ABDOMEN/PELVIS: The previously described splenic hypermetabolism has resolved. No abdominopelvic nodal hypermetabolism. Incidental CT findings: Aortic atherosclerosis. No abdominal or pelvic adenopathy. Hysterectomy. SKELETON: Diffuse marrow hypermetabolism is slightly increased. No focal abnormality identified. Incidental CT findings: none IMPRESSION: 1. Partial metabolic response to therapy of nodal mass within the left side of the thoracic inlet. (Deauville) 5. 2. No new sites of disease identified. 3. Posterior right upper lobe ground-glass nodule versus area of subsegmental atelectasis. Recommend attention on follow-up. 4. 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 5. Coronary artery atherosclerosis. Aortic Atherosclerosis (ICD10-I70.0). Electronically Signed   By: Abigail Miyamoto M.D.   On: 07/30/2019 15:46

## 2019-08-03 NOTE — Progress Notes (Signed)
error 

## 2019-08-08 ENCOUNTER — Ambulatory Visit: Payer: PPO | Admitting: Radiation Oncology

## 2019-08-08 ENCOUNTER — Other Ambulatory Visit: Payer: PPO

## 2019-08-08 ENCOUNTER — Ambulatory Visit: Payer: PPO

## 2019-08-08 ENCOUNTER — Inpatient Hospital Stay: Payer: PPO

## 2019-08-08 ENCOUNTER — Ambulatory Visit: Payer: PPO | Admitting: Hematology

## 2019-08-08 ENCOUNTER — Ambulatory Visit
Admission: RE | Admit: 2019-08-08 | Discharge: 2019-08-08 | Disposition: A | Discharge status: Home / Self Care | Payer: PPO | Source: Ambulatory Visit | Attending: Radiation Oncology | Admitting: Radiation Oncology

## 2019-08-08 DIAGNOSIS — Z510 Encounter for antineoplastic radiation therapy: Secondary | ICD-10-CM | POA: Insufficient documentation

## 2019-08-08 DIAGNOSIS — C8331 Diffuse large B-cell lymphoma, lymph nodes of head, face, and neck: Secondary | ICD-10-CM | POA: Insufficient documentation

## 2019-08-08 NOTE — Progress Notes (Signed)
Has armband been applied?  Yes  Does patient have an allergy to IV contrast dye?: No   Has patient ever received premedication for IV contrast dye?: N/A  Does patient take metformin?: No  If patient does take metformin when was the last dose: N/A  Date of lab work: 08/01/19 BUN: 6 CR: 0.67 EGFR: > 60  IV site: Right Chest PAC  Has IV site been added to flowsheet?  Yes  BP 123/66 (BP Location: Left Arm)   Pulse (!) 108   Temp 98 F (36.7 C) (Tympanic)   Resp 18   Wt 119 lb 6.4 oz (54.2 kg)   SpO2 100%   BMI 24.95 kg/m

## 2019-08-08 NOTE — Progress Notes (Signed)
Nutrition Follow-up:  Patient with lymphoma followed by Dr. Maylon Peppers.  Patient has completed chemotherapy and will be starting radiation treatments soon.    Spoke with patient via phone for nutrition follow-up.  Patient reports that appetite is better and that she is not having nausea (none for the last 2 weeks).  Reports that bowels are moving well without the help of miralax and senokot.  Reports eating eggs, 2 slices of bacon and orange slices this am with hot chocolate around 11am.  Reports that she got up late this am.  Planning on eating a burger, peas and carrots and tossed salad for late lunch.  Has not been drinking ensure shakes as eating more orally.     Medications: reviewed  Labs: reviewed  Anthropometrics:   Weight 121 lb on 12/9 stable since 11/25.     NUTRITION DIAGNOSIS: Unintentional weight loss stable   INTERVENTION:  Encouraged patient to continue to eat well balanced meals.   Recommend if weight starts to decline to add back ensure enlive or ensure plus.   Patient has contact information and patient will reach out to RD if appetite changes or weight drops    MONITORING, EVALUATION, GOAL: Patient will consume adequate calories and protein to prevent weight loss   NEXT VISIT: no follow-up planned, patient to call RD  Sherron Mummert B. Zenia Resides, [place], [place] Registered Dietitian [phone] (pager)

## 2019-08-09 ENCOUNTER — Telehealth: Payer: Self-pay | Admitting: *Deleted

## 2019-08-09 ENCOUNTER — Other Ambulatory Visit: Payer: Self-pay | Admitting: Hematology

## 2019-08-09 DIAGNOSIS — C8331 Diffuse large B-cell lymphoma, lymph nodes of head, face, and neck: Secondary | ICD-10-CM

## 2019-08-09 NOTE — Telephone Encounter (Signed)
Received refill request from pt's pharmacy for Allopurinol.  Dr. Maylon Peppers notified.  Per MD, pt no longer needs this med.  Pt can stop taking Allopurinol. Attempted to call pt without answer.  Left message on voice mail requesting a call back to nurse. Refusal of refill sent back to pharmacy.

## 2019-08-10 ENCOUNTER — Ambulatory Visit: Payer: PPO

## 2019-08-10 ENCOUNTER — Other Ambulatory Visit: Payer: Self-pay

## 2019-08-10 ENCOUNTER — Ambulatory Visit
Admission: RE | Admit: 2019-08-10 | Discharge: 2019-08-10 | Disposition: A | Discharge status: Home / Self Care | Payer: PPO | Source: Ambulatory Visit | Attending: Radiation Oncology | Admitting: Radiation Oncology

## 2019-08-10 VITALS — BP 123/66 | HR 108 | Temp 98.0°F | Resp 18 | Wt 119.4 lb

## 2019-08-10 DIAGNOSIS — C8331 Diffuse large B-cell lymphoma, lymph nodes of head, face, and neck: Secondary | ICD-10-CM

## 2019-08-10 DIAGNOSIS — Z510 Encounter for antineoplastic radiation therapy: Secondary | ICD-10-CM | POA: Diagnosis not present

## 2019-08-10 MED ORDER — SODIUM CHLORIDE 0.9% FLUSH
10.0000 mL | Freq: Once | INTRAVENOUS | Status: AC
  Administered 2019-08-10: 10 mL via INTRAVENOUS

## 2019-08-10 MED ORDER — HEPARIN SOD (PORK) LOCK FLUSH 100 UNIT/ML IV SOLN
500.0000 [IU] | Freq: Once | INTRAVENOUS | Status: AC
  Administered 2019-08-10: 500 [IU] via INTRAVENOUS

## 2019-08-10 NOTE — Progress Notes (Signed)
Head and Neck Cancer Simulation, IMRT treatment planning  note   Outpatient  Diagnosis:    ICD-10-CM   1. Diffuse large B-cell lymphoma of lymph nodes of neck (HCC)  C83.31     The patient was taken to the CT simulator and identity was confirmed.  All relevant records and images related to the planned course of therapy were reviewed.  The patient freely provided informed written consent to proceed with treatment after reviewing the details related to the planned course of therapy. The consent form was witnessed and verified by the simulation staff.    The patient was laid in the supine position on the table. An Aquaplast head and shoulder mask was custom fitted to the patient's anatomy. High-resolution CT axial imaging was obtained of the head and neck with contrast. I verified that the quality of the imaging is good for treatment planning. 1 Medically Necessary Treatment Device was fabricated and supervised by me: Aquaplast mask.  Treatment planning note I plan to treat the patient with IMRT. I plan to treat the patient's tumor (involved site). I plan to treat to a total dose of 45 Gray in 25 fractions. Dose calculation was ordered from dosimetry.  IMRT planning Note  IMRT is medically necessary and an important modality to deliver adequate dose to the patient's at risk tissues while sparing the patient's normal structures, including the: esophagus, parotid tissue, spinal cord, oral cavity. This justifies the use of IMRT in the patient's treatment.     -----------------------------------  Eppie Gibson, MD

## 2019-08-14 DIAGNOSIS — Z510 Encounter for antineoplastic radiation therapy: Secondary | ICD-10-CM | POA: Diagnosis not present

## 2019-08-14 DIAGNOSIS — C8331 Diffuse large B-cell lymphoma, lymph nodes of head, face, and neck: Secondary | ICD-10-CM | POA: Diagnosis not present

## 2019-08-20 ENCOUNTER — Ambulatory Visit
Admission: RE | Admit: 2019-08-20 | Discharge: 2019-08-20 | Disposition: A | Discharge status: Home / Self Care | Payer: PPO | Source: Ambulatory Visit | Attending: Radiation Oncology | Admitting: Radiation Oncology

## 2019-08-20 ENCOUNTER — Other Ambulatory Visit: Payer: Self-pay

## 2019-08-20 DIAGNOSIS — C8331 Diffuse large B-cell lymphoma, lymph nodes of head, face, and neck: Secondary | ICD-10-CM

## 2019-08-20 DIAGNOSIS — Z510 Encounter for antineoplastic radiation therapy: Secondary | ICD-10-CM | POA: Diagnosis not present

## 2019-08-20 MED ORDER — SONAFINE EX EMUL
1.0000 "application " | Freq: Once | CUTANEOUS | Status: AC
  Administered 2019-08-20: 1 via TOPICAL

## 2019-08-20 NOTE — Progress Notes (Signed)

## 2019-08-21 ENCOUNTER — Other Ambulatory Visit: Payer: Self-pay

## 2019-08-21 ENCOUNTER — Ambulatory Visit
Admission: RE | Admit: 2019-08-21 | Discharge: 2019-08-21 | Disposition: A | Discharge status: Home / Self Care | Payer: PPO | Source: Ambulatory Visit | Attending: Radiation Oncology | Admitting: Radiation Oncology

## 2019-08-21 DIAGNOSIS — Z510 Encounter for antineoplastic radiation therapy: Secondary | ICD-10-CM | POA: Diagnosis not present

## 2019-08-21 DIAGNOSIS — C8331 Diffuse large B-cell lymphoma, lymph nodes of head, face, and neck: Secondary | ICD-10-CM | POA: Diagnosis not present

## 2019-08-22 ENCOUNTER — Other Ambulatory Visit: Payer: Self-pay

## 2019-08-22 ENCOUNTER — Ambulatory Visit
Admission: RE | Admit: 2019-08-22 | Discharge: 2019-08-22 | Disposition: A | Discharge status: Home / Self Care | Payer: PPO | Source: Ambulatory Visit | Attending: Radiation Oncology | Admitting: Radiation Oncology

## 2019-08-22 DIAGNOSIS — Z510 Encounter for antineoplastic radiation therapy: Secondary | ICD-10-CM | POA: Diagnosis not present

## 2019-08-22 DIAGNOSIS — C8331 Diffuse large B-cell lymphoma, lymph nodes of head, face, and neck: Secondary | ICD-10-CM | POA: Diagnosis not present

## 2019-08-23 ENCOUNTER — Ambulatory Visit
Admission: RE | Admit: 2019-08-23 | Discharge: 2019-08-23 | Disposition: A | Discharge status: Home / Self Care | Payer: PPO | Source: Ambulatory Visit | Attending: Radiation Oncology | Admitting: Radiation Oncology

## 2019-08-23 ENCOUNTER — Other Ambulatory Visit: Payer: Self-pay

## 2019-08-23 ENCOUNTER — Encounter: Payer: Self-pay | Admitting: *Deleted

## 2019-08-23 DIAGNOSIS — Z20828 Contact with and (suspected) exposure to other viral communicable diseases: Secondary | ICD-10-CM | POA: Diagnosis not present

## 2019-08-23 DIAGNOSIS — Z510 Encounter for antineoplastic radiation therapy: Secondary | ICD-10-CM | POA: Diagnosis not present

## 2019-08-23 DIAGNOSIS — Z20822 Contact with and (suspected) exposure to covid-19: Secondary | ICD-10-CM

## 2019-08-23 DIAGNOSIS — C8331 Diffuse large B-cell lymphoma, lymph nodes of head, face, and neck: Secondary | ICD-10-CM | POA: Diagnosis not present

## 2019-08-25 LAB — SPECIMEN STATUS REPORT

## 2019-08-25 LAB — NOVEL CORONAVIRUS, NAA: SARS-CoV-2, NAA: NOT DETECTED

## 2019-08-27 ENCOUNTER — Other Ambulatory Visit: Payer: Self-pay

## 2019-08-27 ENCOUNTER — Ambulatory Visit
Admission: RE | Admit: 2019-08-27 | Discharge: 2019-08-27 | Disposition: A | Discharge status: Home / Self Care | Payer: PPO | Source: Ambulatory Visit | Attending: Radiation Oncology | Admitting: Radiation Oncology

## 2019-08-27 DIAGNOSIS — Z510 Encounter for antineoplastic radiation therapy: Secondary | ICD-10-CM | POA: Insufficient documentation

## 2019-08-27 DIAGNOSIS — C8331 Diffuse large B-cell lymphoma, lymph nodes of head, face, and neck: Secondary | ICD-10-CM | POA: Insufficient documentation

## 2019-08-27 NOTE — Progress Notes (Signed)
Oncology Nurse Navigator Documentation  Met with Ms. Hubers prior to RT to check on well-being since 12/28 [facility] [place]. She stated tmts going well, denied needs or concerns. I encouraged her to call me as needed.  She agreed to do so.  [name], RN, BSN Head & Neck Oncology Nurse [place] [facility] [place] [phone]

## 2019-08-28 ENCOUNTER — Ambulatory Visit
Admission: RE | Admit: 2019-08-28 | Discharge: 2019-08-28 | Disposition: A | Discharge status: Home / Self Care | Payer: PPO | Source: Ambulatory Visit | Attending: Radiation Oncology | Admitting: Radiation Oncology

## 2019-08-28 ENCOUNTER — Other Ambulatory Visit: Payer: Self-pay

## 2019-08-28 DIAGNOSIS — C8331 Diffuse large B-cell lymphoma, lymph nodes of head, face, and neck: Secondary | ICD-10-CM | POA: Diagnosis not present

## 2019-08-29 ENCOUNTER — Inpatient Hospital Stay: Payer: PPO

## 2019-08-29 ENCOUNTER — Ambulatory Visit
Admission: RE | Admit: 2019-08-29 | Discharge: 2019-08-29 | Disposition: A | Discharge status: Home / Self Care | Payer: PPO | Source: Ambulatory Visit | Attending: Radiation Oncology | Admitting: Radiation Oncology

## 2019-08-29 ENCOUNTER — Inpatient Hospital Stay: Payer: PPO | Attending: Hematology | Admitting: Hematology

## 2019-08-29 DIAGNOSIS — Y842 Radiological procedure and radiotherapy as the cause of abnormal reaction of the patient, or of later complication, without mention of misadventure at the time of the procedure: Secondary | ICD-10-CM | POA: Insufficient documentation

## 2019-08-29 DIAGNOSIS — Z791 Long term (current) use of non-steroidal anti-inflammatories (NSAID): Secondary | ICD-10-CM | POA: Insufficient documentation

## 2019-08-29 DIAGNOSIS — T451X5A Adverse effect of antineoplastic and immunosuppressive drugs, initial encounter: Secondary | ICD-10-CM | POA: Insufficient documentation

## 2019-08-29 DIAGNOSIS — C8331 Diffuse large B-cell lymphoma, lymph nodes of head, face, and neck: Secondary | ICD-10-CM | POA: Diagnosis not present

## 2019-08-29 DIAGNOSIS — K219 Gastro-esophageal reflux disease without esophagitis: Secondary | ICD-10-CM | POA: Insufficient documentation

## 2019-08-29 DIAGNOSIS — R12 Heartburn: Secondary | ICD-10-CM | POA: Insufficient documentation

## 2019-08-29 DIAGNOSIS — Z79899 Other long term (current) drug therapy: Secondary | ICD-10-CM | POA: Insufficient documentation

## 2019-08-29 DIAGNOSIS — C8330 Diffuse large B-cell lymphoma, unspecified site: Secondary | ICD-10-CM | POA: Insufficient documentation

## 2019-08-29 DIAGNOSIS — K208 Other esophagitis without bleeding: Secondary | ICD-10-CM | POA: Insufficient documentation

## 2019-08-29 DIAGNOSIS — D6481 Anemia due to antineoplastic chemotherapy: Secondary | ICD-10-CM | POA: Insufficient documentation

## 2019-08-30 ENCOUNTER — Other Ambulatory Visit: Payer: Self-pay

## 2019-08-30 ENCOUNTER — Ambulatory Visit
Admission: RE | Admit: 2019-08-30 | Discharge: 2019-08-30 | Disposition: A | Discharge status: Home / Self Care | Payer: PPO | Source: Ambulatory Visit | Attending: Radiation Oncology | Admitting: Radiation Oncology

## 2019-08-30 DIAGNOSIS — C8331 Diffuse large B-cell lymphoma, lymph nodes of head, face, and neck: Secondary | ICD-10-CM | POA: Diagnosis not present

## 2019-08-31 ENCOUNTER — Other Ambulatory Visit: Payer: Self-pay

## 2019-08-31 ENCOUNTER — Telehealth: Payer: Self-pay | Admitting: Hematology

## 2019-08-31 ENCOUNTER — Ambulatory Visit
Admission: RE | Admit: 2019-08-31 | Discharge: 2019-08-31 | Disposition: A | Discharge status: Home / Self Care | Payer: PPO | Source: Ambulatory Visit | Attending: Radiation Oncology | Admitting: Radiation Oncology

## 2019-08-31 DIAGNOSIS — C8331 Diffuse large B-cell lymphoma, lymph nodes of head, face, and neck: Secondary | ICD-10-CM | POA: Diagnosis not present

## 2019-08-31 NOTE — Telephone Encounter (Signed)
R/s appt per 1/7 sch message - unable to reach pt . Left message with appt date and time

## 2019-09-03 ENCOUNTER — Other Ambulatory Visit: Payer: Self-pay

## 2019-09-03 ENCOUNTER — Ambulatory Visit
Admission: RE | Admit: 2019-09-03 | Discharge: 2019-09-03 | Disposition: A | Discharge status: Home / Self Care | Payer: PPO | Source: Ambulatory Visit | Attending: Radiation Oncology | Admitting: Radiation Oncology

## 2019-09-03 DIAGNOSIS — C8331 Diffuse large B-cell lymphoma, lymph nodes of head, face, and neck: Secondary | ICD-10-CM | POA: Diagnosis not present

## 2019-09-04 ENCOUNTER — Other Ambulatory Visit: Payer: Self-pay

## 2019-09-04 ENCOUNTER — Ambulatory Visit
Admission: RE | Admit: 2019-09-04 | Discharge: 2019-09-04 | Disposition: A | Discharge status: Home / Self Care | Payer: PPO | Source: Ambulatory Visit | Attending: Radiation Oncology | Admitting: Radiation Oncology

## 2019-09-04 DIAGNOSIS — C8331 Diffuse large B-cell lymphoma, lymph nodes of head, face, and neck: Secondary | ICD-10-CM | POA: Diagnosis not present

## 2019-09-05 ENCOUNTER — Inpatient Hospital Stay (HOSPITAL_BASED_OUTPATIENT_CLINIC_OR_DEPARTMENT_OTHER): Payer: PPO | Admitting: Hematology

## 2019-09-05 ENCOUNTER — Other Ambulatory Visit: Payer: Self-pay

## 2019-09-05 ENCOUNTER — Encounter: Payer: Self-pay | Admitting: Hematology

## 2019-09-05 ENCOUNTER — Inpatient Hospital Stay: Payer: PPO

## 2019-09-05 ENCOUNTER — Ambulatory Visit
Admission: RE | Admit: 2019-09-05 | Discharge: 2019-09-05 | Disposition: A | Discharge status: Home / Self Care | Payer: PPO | Source: Ambulatory Visit | Attending: Radiation Oncology | Admitting: Radiation Oncology

## 2019-09-05 VITALS — BP 128/81 | HR 104 | Temp 97.8°F | Resp 16 | Ht <= 58 in | Wt 119.6 lb

## 2019-09-05 DIAGNOSIS — K208 Other esophagitis without bleeding: Secondary | ICD-10-CM | POA: Diagnosis not present

## 2019-09-05 DIAGNOSIS — Z79899 Other long term (current) drug therapy: Secondary | ICD-10-CM

## 2019-09-05 DIAGNOSIS — T451X5A Adverse effect of antineoplastic and immunosuppressive drugs, initial encounter: Secondary | ICD-10-CM | POA: Diagnosis not present

## 2019-09-05 DIAGNOSIS — D6481 Anemia due to antineoplastic chemotherapy: Secondary | ICD-10-CM | POA: Diagnosis not present

## 2019-09-05 DIAGNOSIS — K219 Gastro-esophageal reflux disease without esophagitis: Secondary | ICD-10-CM | POA: Diagnosis not present

## 2019-09-05 DIAGNOSIS — Z791 Long term (current) use of non-steroidal anti-inflammatories (NSAID): Secondary | ICD-10-CM | POA: Diagnosis not present

## 2019-09-05 DIAGNOSIS — C8331 Diffuse large B-cell lymphoma, lymph nodes of head, face, and neck: Secondary | ICD-10-CM

## 2019-09-05 DIAGNOSIS — R12 Heartburn: Secondary | ICD-10-CM | POA: Diagnosis not present

## 2019-09-05 DIAGNOSIS — C8330 Diffuse large B-cell lymphoma, unspecified site: Secondary | ICD-10-CM

## 2019-09-05 DIAGNOSIS — Z95828 Presence of other vascular implants and grafts: Secondary | ICD-10-CM

## 2019-09-05 DIAGNOSIS — Y842 Radiological procedure and radiotherapy as the cause of abnormal reaction of the patient, or of later complication, without mention of misadventure at the time of the procedure: Secondary | ICD-10-CM | POA: Diagnosis not present

## 2019-09-05 LAB — CBC WITH DIFFERENTIAL (CANCER CENTER ONLY)
Abs Immature Granulocytes: 0.02 10*3/uL (ref 0.00–0.07)
Basophils Absolute: 0 10*3/uL (ref 0.0–0.1)
Basophils Relative: 0 %
Eosinophils Absolute: 0.4 10*3/uL (ref 0.0–0.5)
Eosinophils Relative: 7 %
HCT: 36.2 % (ref 36.0–46.0)
Hemoglobin: 11.6 g/dL — ABNORMAL LOW (ref 12.0–15.0)
Immature Granulocytes: 0 %
Lymphocytes Relative: 30 %
Lymphs Abs: 1.7 10*3/uL (ref 0.7–4.0)
MCH: 30 pg (ref 26.0–34.0)
MCHC: 32 g/dL (ref 30.0–36.0)
MCV: 93.5 fL (ref 80.0–100.0)
Monocytes Absolute: 0.9 10*3/uL (ref 0.1–1.0)
Monocytes Relative: 16 %
Neutro Abs: 2.7 10*3/uL (ref 1.7–7.7)
Neutrophils Relative %: 47 %
Platelet Count: 237 10*3/uL (ref 150–400)
RBC: 3.87 MIL/uL (ref 3.87–5.11)
RDW: 16.7 % — ABNORMAL HIGH (ref 11.5–15.5)
WBC Count: 5.8 10*3/uL (ref 4.0–10.5)
nRBC: 0 % (ref 0.0–0.2)

## 2019-09-05 LAB — CMP (CANCER CENTER ONLY)
ALT: 26 U/L (ref 0–44)
AST: 25 U/L (ref 15–41)
Albumin: 3.9 g/dL (ref 3.5–5.0)
Alkaline Phosphatase: 70 U/L (ref 38–126)
Anion gap: 10 (ref 5–15)
BUN: 13 mg/dL (ref 8–23)
CO2: 24 mmol/L (ref 22–32)
Calcium: 9.4 mg/dL (ref 8.9–10.3)
Chloride: 105 mmol/L (ref 98–111)
Creatinine: 0.64 mg/dL (ref 0.44–1.00)
GFR, Est AFR Am: >60 mL/min (ref >60)
GFR, Estimated: >60 mL/min (ref >60)
Glucose, Bld: 81 mg/dL (ref 70–99)
Potassium: 4 mmol/L (ref 3.5–5.1)
Sodium: 139 mmol/L (ref 135–145)
Total Bilirubin: 0.4 mg/dL (ref 0.3–1.2)
Total Protein: 6.8 g/dL (ref 6.5–8.1)

## 2019-09-05 MED ORDER — HEPARIN SOD (PORK) LOCK FLUSH 100 UNIT/ML IV SOLN
500.0000 [IU] | Freq: Once | INTRAVENOUS | Status: AC | PRN
  Administered 2019-09-05: 500 [IU]
  Filled 2019-09-05: qty 5

## 2019-09-05 MED ORDER — ALTEPLASE 2 MG IJ SOLR
2.0000 mg | Freq: Once | INTRAMUSCULAR | Status: DC | PRN
  Filled 2019-09-05: qty 2

## 2019-09-05 MED ORDER — SODIUM CHLORIDE 0.9% FLUSH
10.0000 mL | INTRAVENOUS | Status: DC | PRN
  Administered 2019-09-05: 10 mL
  Filled 2019-09-05: qty 10

## 2019-09-05 MED ORDER — SUCRALFATE 1 GM/10ML PO SUSP: 1.0000 g | Freq: Three times a day (TID) | ORAL | 2 refills | Status: DC

## 2019-09-05 NOTE — Progress Notes (Signed)
Sandra Payne OFFICE PROGRESS NOTE  Patient Care Team: Jonathon Jordan, MD as PCP - General (Family Medicine) Tish Men, MD as Consulting Physician (Hematology) Eppie Gibson, MD as Attending Physician (Radiation Oncology) Leota Sauers, RN as Oncology Nurse Navigator Jennet Maduro, [place] as Dietitian (Dietician) Izora Gala, MD as Consulting Physician (Otolaryngology)  HEME/ONC OVERVIEW: 1. 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 BCL2 rearrangement positive, no BCL6 or Myc rearrangement -03/2019: left supraclavicular (bulky), left axillary, RP, mesenteric and iliac adenopathy on PET; bone marrow bx negative for lymphoma  -Mid-03/2019 - late 06/2019: R-CHOP with G-CSF support   EOT PET showed decreased but residual left thoracic inlet LN disease (Deauville 5); resolution of other sites of disease  -Late 07/2019 - present: ISRT, 45 Gy/25 fractions   2. 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 CBC w/ diff    Standing Status:   Future    Standing Expiration Date:   10/09/2020  . CMP    Standing Status:   Future    Standing Expiration Date:   10/09/2020  . [facility]    Standing Status:   Future    Standing Expiration Date:   10/09/2020   All questions were answered. The patient knows to call the clinic with any problems, questions or concerns. No barriers to learning was detected.  Return in 4 weeks for labs, port flush, and clinic appt.   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  Patient reports that she has been tolerating radiation treatment relatively well, except for a sour taste in her mouth with belching, for which she has been taking Tums and Pepto-Bismol with some improvement.  She is also on Protonix.  She denies any constitutional symptoms or worsening lymphadenopathy.  REVIEW OF SYSTEMS:   Constitutional: ( - ) fevers, ( - )  chills , ( - ) night sweats Eyes: ( - ) blurriness of vision, ( - ) double vision, ( - ) watery eyes Ears, nose, mouth, throat, and face: ( - ) mucositis, ( - ) sore throat Respiratory: ( - ) cough, ( - ) dyspnea, ( - ) wheezes Cardiovascular: ( - ) palpitation, ( - ) chest discomfort, ( - ) lower extremity swelling Gastrointestinal:  ( - ) nausea, ( + ) heartburn, ( - ) change in  bowel habits Skin: ( - ) abnormal skin rashes Lymphatics: ( - ) new lymphadenopathy, ( - ) easy bruising Neurological: ( - ) numbness, ( - ) tingling, ( - ) new weaknesses Behavioral/Psych: ( - ) mood change, ( - ) new changes  All other systems were reviewed with the patient and are negative.  SUMMARY OF ONCOLOGIC HISTORY: Oncology History  DLBCL (diffuse large B cell  lymphoma) ([place])  02/19/2019 Imaging   CT neck: IMPRESSION: 1. 7 cm left lower neck mass extending into the superior mediastinum most concerning for malignancy. This may reflect a conglomerate nodal mass or primary tumor of uncertain origin. 2. Additional enlarged lymph nodes adjacent to the mass in the left supraclavicular/retroclavicular and left subpectoral regions. 3.  Aortic Atherosclerosis (ICD10-I70.0).   03/12/2019 Procedure   Incisional left cervical LN bx by Dr. Constance Holster   03/12/2019 Pathology Results   Accession: [id number] Soft tissue mass, simple excision, Left Neck - DIFFUSE LARGE B-CELL LYMPHOMA - SEE COMMENT   03/19/2019 Initial Diagnosis   DLBCL (diffuse large B cell lymphoma) ([place])   03/29/2019 Imaging   PET: IMPRESSION: 1. The large left supraclavicular mass is intensely hypermetabolic compatible with history of lymphoma. There are also nearby FDG avid left supraclavicular, left axillary, and left retroperitoneal lymph nodes. 2. Hypermetabolic retroperitoneal, mesenteric and iliac adenopathy. 3. Aortic Atherosclerosis (ICD10-I70.0). Coronary artery calcifications.   04/04/2019 -  Chemotherapy   The patient had DOXOrubicin (ADRIAMYCIN) chemo injection 80 mg, 50 mg/m2 = 80 mg, Intravenous,  Once, 6 of 6 cycles Administration: 80 mg (04/04/2019), 80 mg (04/25/2019), 80 mg (05/16/2019), 80 mg (06/06/2019), 80 mg (06/27/2019), 80 mg (07/18/2019) palonosetron (ALOXI) injection 0.25 mg, 0.25 mg, Intravenous,  Once, 6 of 6 cycles Administration: 0.25 mg (04/04/2019), 0.25 mg (04/25/2019), 0.25 mg (05/16/2019), 0.25 mg (06/06/2019), 0.25 mg (06/27/2019), 0.25 mg (07/18/2019) pegfilgrastim-jmdb (FULPHILA) injection 6 mg, 6 mg, Subcutaneous,  Once, 6 of 6 cycles Administration: 6 mg (04/06/2019), 6 mg (04/27/2019), 6 mg (05/18/2019), 6 mg (06/08/2019), 6 mg (06/29/2019), 6 mg (07/20/2019) vinCRIStine (ONCOVIN) 2 mg in sodium chloride 0.9 % 50 mL chemo infusion, 2 mg, Intravenous,  Once, 6 of 6  cycles Administration: 2 mg (04/04/2019), 2 mg (04/25/2019), 2 mg (05/16/2019), 2 mg (06/06/2019), 2 mg (06/27/2019), 2 mg (07/18/2019) riTUXimab (RITUXAN) 600 mg in sodium chloride 0.9 % 250 mL (1.9355 mg/mL) infusion, 375 mg/m2 = 600 mg, Intravenous,  Once, 1 of 1 cycle Administration: 600 mg (04/04/2019) cyclophosphamide (CYTOXAN) 1,180 mg in sodium chloride 0.9 % 250 mL chemo infusion, 750 mg/m2 = 1,180 mg, Intravenous,  Once, 6 of 6 cycles Dose modification: 600 mg/m2 (original dose 750 mg/m2, Cycle 6, Reason: Dose not tolerated) Administration: 1,180 mg (04/04/2019), 1,180 mg (04/25/2019), 1,180 mg (05/16/2019), 1,180 mg (06/06/2019), 1,180 mg (06/27/2019), 940 mg (07/18/2019) riTUXimab (RITUXAN) 600 mg in sodium chloride 0.9 % 190 mL infusion, 375 mg/m2 = 600 mg, Intravenous,  Once, 5 of 5 cycles Administration: 600 mg (04/25/2019), 600 mg (05/16/2019), 600 mg (06/06/2019), 600 mg (06/27/2019), 600 mg (07/18/2019) fosaprepitant (EMEND) 150 mg, dexamethasone (DECADRON) 12 mg in sodium chloride 0.9 % 145 mL IVPB, , Intravenous,  Once, 6 of 6 cycles Administration:  (04/04/2019),  (04/25/2019),  (05/16/2019),  (06/06/2019),  (06/27/2019),  (07/18/2019)  for chemotherapy treatment.    06/12/2019 Imaging   PET (after 4 cycles of R-CHOP): IMPRESSION: 1. Partial treatment response. Dominant left supraclavicular nodal mass is decreased in size and metabolism, with persistent Deauville score 5 uptake. Previously visualized hypermetabolic left retropectoral,  retroperitoneal and lower mesenteric lymph nodes have resolved. 2. Stable low level splenic hypermetabolism, nonspecific, possibly reactive. Normal size spleen. No splenic masses. 3. Low level hypermetabolism throughout the skeleton, compatible with mildly stimulated marrow state. 4. Asymmetric right glottic uptake without CT mass correlate, cannot exclude left focal cord paralysis. 5.  Aortic Atherosclerosis (ICD10-I70.0).   07/30/2019 Imaging   EOT  PET: IMPRESSION: 1. Partial metabolic response to therapy of nodal mass within the left side of the thoracic inlet. (Deauville) 5. 2. No new sites of disease identified. 3. Posterior right upper lobe ground-glass nodule versus area of subsegmental atelectasis. Recommend attention on follow-up. 4. 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 5. Coronary artery atherosclerosis. Aortic Atherosclerosis (ICD10-I70.0).   08/01/2019 Cancer Staging   Staging form: Hodgkin and Non-Hodgkin Lymphoma, AJCC 8th Edition - Clinical: Stage III - Signed by Tish Men, MD on 08/01/2019     I have reviewed the past medical history, past surgical history, social history and family history with the patient and they are unchanged from previous note.  ALLERGIES:  is allergic to codeine and sulfa antibiotics.  MEDICATIONS:  Current Outpatient Medications  Medication Sig Dispense Refill  . acetaminophen (TYLENOL) 325 MG tablet Take 650 mg by mouth every 6 (six) hours as needed for mild pain or headache.     . albuterol (VENTOLIN HFA) 108 (90 Base) MCG/ACT inhaler Inhale 2 puffs into the lungs every 6 (six) hours as needed.    [name] Kitchen allopurinol (ZYLOPRIM) 300 MG tablet Take 1 tablet (300 mg total) by mouth daily. 30 tablet 3  . celecoxib (CELEBREX) 200 MG capsule Take 200 mg by mouth every other day.    . DULoxetine (CYMBALTA) 30 MG capsule 1 CAPSULE ONCE A DAY DAILY, MAY INCREASE TO TWICE A DAY AFTER 7 DAYS IF NOT BETTER    . lidocaine-prilocaine (EMLA) cream Apply to affected area once 30 g 3  . metoCLOPramide (REGLAN) 10 MG tablet Take 1 tablet (10 mg total) by mouth every 8 (eight) hours as needed for nausea. 40 tablet 0  . Multiple Vitamin (MULTIVITAMIN) tablet Take 1 tablet by mouth daily.    . pegfilgrastim (NEULASTA ONPRO KIT) 6 MG/0.6ML injection Inject 6 mg into the skin as directed. Inject via provided programmed delivery device.  Every 3 weeks    . polyvinyl alcohol (LIQUIFILM  TEARS) 1.4 % ophthalmic solution Place 1 drop into both eyes as needed for dry eyes (allergies).    . zolpidem (AMBIEN) 10 MG tablet TAKE 0.5  1 TABLET AT BEDTIME AS NEEDED    . gabapentin (NEURONTIN) 400 MG capsule Take 1 capsule (400 mg total) by mouth 2 (two) times daily. 60 capsule 2  . HYDROcodone-acetaminophen (NORCO) 7.5-325 MG tablet Take 1 tablet by mouth every 6 (six) hours as needed for moderate pain. (Patient not taking: Reported on 06/27/2019) 120 tablet 0  . LORazepam (ATIVAN) 0.5 MG tablet Take 1 tablet (0.5 mg total) by mouth 2 (two) times daily as needed (for refractory nausea and vomiting). (Patient not taking: Reported on 07/18/2019) 30 tablet 0  . ondansetron (ZOFRAN) 8 MG tablet Take 1 tablet (8 mg total) by mouth 2 (two) times daily as needed for refractory nausea / vomiting. Start on day 3 after cyclophosphamide chemotherapy. (Patient not taking: Reported on 07/10/2019) 30 tablet 1  . pantoprazole (PROTONIX) 20 MG tablet Take 1 tablet (20 mg total) by mouth daily. 30 tablet 5  . potassium chloride SA (KLOR-CON) 20 MEQ tablet Take 1 tablet (  20 mEq total) by mouth daily. (Patient not taking: Reported on 08/01/2019) 10 tablet 0  . prochlorperazine (COMPAZINE) 10 MG tablet Take 1 tablet (10 mg total) by mouth every 6 (six) hours as needed (Nausea or vomiting). (Patient not taking: Reported on 09/05/2019) 30 tablet 6  . sucralfate (CARAFATE) 1 GM/10ML suspension Take 10 mLs (1 g total) by mouth 4 (four) times daily -  with meals and at bedtime. 420 mL 2  . traMADol (ULTRAM) 50 MG tablet Take 50 mg by mouth every 6 (six) hours as needed for moderate pain.      No current facility-administered medications for this visit.    PHYSICAL EXAMINATION: ECOG PERFORMANCE STATUS: 1 - Symptomatic but completely ambulatory  Today's Vitals   09/05/19 1345 09/05/19 1350  BP: 128/81   Pulse: (!) 104   Resp: 16   Temp: 97.8 F (36.6 C)   TempSrc: Temporal   SpO2: 100%   Weight: 119 lb 9.6 oz  (54.3 kg)   Height: '4\' 10"'$  (1.473 m)   PainSc:  0-No pain   Body mass index is 25 kg/m.  Filed Weights   09/05/19 1345  Weight: 119 lb 9.6 oz (54.3 kg)    GENERAL: alert, no distress and comfortable SKIN: skin color, texture, turgor are normal, no rashes or significant lesions EYES: conjunctiva are pink and non-injected, sclera clear OROPHARYNX: no exudate, no erythema; lips, buccal mucosa, and tongue normal  NECK: supple, non-tender LYMPH:  no palpable lymphadenopathy in the cervical, slight left supraclavicular fossa fullness  LUNGS: clear to auscultation with normal breathing effort HEART: regular rate & rhythm and no murmurs and no lower extremity edema ABDOMEN: soft, non-tender, non-distended, normal bowel sounds Musculoskeletal: no cyanosis of digits and no clubbing  PSYCH: alert & oriented x 3, fluent speech  LABORATORY DATA:  I have reviewed the data as listed    Component Value Date/Time   NA 140 08/01/2019 0925   NA 137 01/16/2015 0000   K 3.4 (L) 08/01/2019 0925   CL 106 08/01/2019 0925   CO2 26 08/01/2019 0925   GLUCOSE 102 (H) 08/01/2019 0925   BUN 6 (L) 08/01/2019 0925   BUN 12 01/16/2015 0000   CREATININE 0.67 08/01/2019 0925   CALCIUM 8.5 (L) 08/01/2019 0925   PROT 6.0 (L) 08/01/2019 0925   ALBUMIN 3.4 (L) 08/01/2019 0925   AST 16 08/01/2019 0925   ALT 12 08/01/2019 0925   ALKPHOS 69 08/01/2019 0925   BILITOT <0.2 (L) 08/01/2019 0925   GFRNONAA >60 08/01/2019 0925   GFRAA >60 08/01/2019 0925    No results found for: SPEP, UPEP  Lab Results  Component Value Date   WBC 5.8 09/05/2019   NEUTROABS 2.7 09/05/2019   HGB 11.6 (L) 09/05/2019   HCT 36.2 09/05/2019   MCV 93.5 09/05/2019   PLT 237 09/05/2019      Chemistry      Component Value Date/Time   NA 140 08/01/2019 0925   NA 137 01/16/2015 0000   K 3.4 (L) 08/01/2019 0925   CL 106 08/01/2019 0925   CO2 26 08/01/2019 0925   BUN 6 (L) 08/01/2019 0925   BUN 12 01/16/2015 0000   CREATININE  0.67 08/01/2019 0925   GLU 96 01/16/2015 0000      Component Value Date/Time   CALCIUM 8.5 (L) 08/01/2019 0925   ALKPHOS 69 08/01/2019 0925   AST 16 08/01/2019 0925   ALT 12 08/01/2019 0925   BILITOT <0.2 (L) 08/01/2019 6759  RADIOGRAPHIC STUDIES: I have personally reviewed the radiological images as listed below and agreed with the findings in the report. No results found.

## 2019-09-06 ENCOUNTER — Ambulatory Visit
Admission: RE | Admit: 2019-09-06 | Discharge: 2019-09-06 | Disposition: A | Discharge status: Home / Self Care | Payer: PPO | Source: Ambulatory Visit | Attending: Radiation Oncology | Admitting: Radiation Oncology

## 2019-09-06 ENCOUNTER — Other Ambulatory Visit: Payer: Self-pay

## 2019-09-06 DIAGNOSIS — C8331 Diffuse large B-cell lymphoma, lymph nodes of head, face, and neck: Secondary | ICD-10-CM | POA: Diagnosis not present

## 2019-09-07 ENCOUNTER — Ambulatory Visit
Admission: RE | Admit: 2019-09-07 | Discharge: 2019-09-07 | Disposition: A | Discharge status: Home / Self Care | Payer: PPO | Source: Ambulatory Visit | Attending: Radiation Oncology | Admitting: Radiation Oncology

## 2019-09-07 ENCOUNTER — Other Ambulatory Visit: Payer: Self-pay

## 2019-09-07 DIAGNOSIS — C8331 Diffuse large B-cell lymphoma, lymph nodes of head, face, and neck: Secondary | ICD-10-CM | POA: Diagnosis not present

## 2019-09-10 ENCOUNTER — Ambulatory Visit
Admission: RE | Admit: 2019-09-10 | Discharge: 2019-09-10 | Disposition: A | Discharge status: Home / Self Care | Payer: PPO | Source: Ambulatory Visit | Attending: Radiation Oncology | Admitting: Radiation Oncology

## 2019-09-10 ENCOUNTER — Other Ambulatory Visit: Payer: Self-pay

## 2019-09-10 DIAGNOSIS — C8331 Diffuse large B-cell lymphoma, lymph nodes of head, face, and neck: Secondary | ICD-10-CM | POA: Diagnosis not present

## 2019-09-11 ENCOUNTER — Other Ambulatory Visit: Payer: Self-pay

## 2019-09-11 ENCOUNTER — Ambulatory Visit
Admission: RE | Admit: 2019-09-11 | Discharge: 2019-09-11 | Disposition: A | Discharge status: Home / Self Care | Payer: PPO | Source: Ambulatory Visit | Attending: Radiation Oncology | Admitting: Radiation Oncology

## 2019-09-11 DIAGNOSIS — C8331 Diffuse large B-cell lymphoma, lymph nodes of head, face, and neck: Secondary | ICD-10-CM | POA: Diagnosis not present

## 2019-09-12 ENCOUNTER — Other Ambulatory Visit: Payer: Self-pay

## 2019-09-12 ENCOUNTER — Ambulatory Visit
Admission: RE | Admit: 2019-09-12 | Discharge: 2019-09-12 | Disposition: A | Discharge status: Home / Self Care | Payer: PPO | Source: Ambulatory Visit | Attending: Radiation Oncology | Admitting: Radiation Oncology

## 2019-09-12 DIAGNOSIS — C8331 Diffuse large B-cell lymphoma, lymph nodes of head, face, and neck: Secondary | ICD-10-CM | POA: Diagnosis not present

## 2019-09-13 ENCOUNTER — Ambulatory Visit
Admission: RE | Admit: 2019-09-13 | Discharge: 2019-09-13 | Disposition: A | Discharge status: Home / Self Care | Payer: PPO | Source: Ambulatory Visit | Attending: Radiation Oncology | Admitting: Radiation Oncology

## 2019-09-13 ENCOUNTER — Other Ambulatory Visit: Payer: Self-pay

## 2019-09-13 DIAGNOSIS — C8331 Diffuse large B-cell lymphoma, lymph nodes of head, face, and neck: Secondary | ICD-10-CM | POA: Diagnosis not present

## 2019-09-14 ENCOUNTER — Other Ambulatory Visit: Payer: Self-pay

## 2019-09-14 ENCOUNTER — Ambulatory Visit
Admission: RE | Admit: 2019-09-14 | Discharge: 2019-09-14 | Disposition: A | Discharge status: Home / Self Care | Payer: PPO | Source: Ambulatory Visit | Attending: Radiation Oncology | Admitting: Radiation Oncology

## 2019-09-14 DIAGNOSIS — C8331 Diffuse large B-cell lymphoma, lymph nodes of head, face, and neck: Secondary | ICD-10-CM | POA: Diagnosis not present

## 2019-09-17 ENCOUNTER — Ambulatory Visit
Admission: RE | Admit: 2019-09-17 | Discharge: 2019-09-17 | Disposition: A | Discharge status: Home / Self Care | Payer: PPO | Source: Ambulatory Visit | Attending: Radiation Oncology | Admitting: Radiation Oncology

## 2019-09-17 ENCOUNTER — Other Ambulatory Visit: Payer: Self-pay | Admitting: Radiation Oncology

## 2019-09-17 ENCOUNTER — Other Ambulatory Visit: Payer: Self-pay

## 2019-09-17 DIAGNOSIS — C8331 Diffuse large B-cell lymphoma, lymph nodes of head, face, and neck: Secondary | ICD-10-CM | POA: Diagnosis not present

## 2019-09-17 MED ORDER — SONAFINE EX EMUL
1.0000 "application " | Freq: Once | CUTANEOUS | Status: AC
  Administered 2019-09-17: 1 via TOPICAL

## 2019-09-18 ENCOUNTER — Ambulatory Visit: Payer: PPO

## 2019-09-18 ENCOUNTER — Other Ambulatory Visit: Payer: Self-pay

## 2019-09-18 ENCOUNTER — Ambulatory Visit
Admission: RE | Admit: 2019-09-18 | Discharge: 2019-09-18 | Disposition: A | Discharge status: Home / Self Care | Payer: PPO | Source: Ambulatory Visit | Attending: Radiation Oncology | Admitting: Radiation Oncology

## 2019-09-18 DIAGNOSIS — C8331 Diffuse large B-cell lymphoma, lymph nodes of head, face, and neck: Secondary | ICD-10-CM | POA: Diagnosis not present

## 2019-09-19 ENCOUNTER — Ambulatory Visit
Admission: RE | Admit: 2019-09-19 | Discharge: 2019-09-19 | Disposition: A | Discharge status: Home / Self Care | Payer: PPO | Source: Ambulatory Visit | Attending: Radiation Oncology | Admitting: Radiation Oncology

## 2019-09-19 ENCOUNTER — Other Ambulatory Visit: Payer: Self-pay

## 2019-09-19 DIAGNOSIS — C8331 Diffuse large B-cell lymphoma, lymph nodes of head, face, and neck: Secondary | ICD-10-CM | POA: Diagnosis not present

## 2019-09-20 ENCOUNTER — Other Ambulatory Visit: Payer: Self-pay

## 2019-09-20 ENCOUNTER — Ambulatory Visit
Admission: RE | Admit: 2019-09-20 | Discharge: 2019-09-20 | Disposition: A | Discharge status: Home / Self Care | Payer: PPO | Source: Ambulatory Visit | Attending: Radiation Oncology | Admitting: Radiation Oncology

## 2019-09-20 DIAGNOSIS — C8331 Diffuse large B-cell lymphoma, lymph nodes of head, face, and neck: Secondary | ICD-10-CM | POA: Diagnosis not present

## 2019-09-21 ENCOUNTER — Other Ambulatory Visit: Payer: Self-pay

## 2019-09-21 ENCOUNTER — Other Ambulatory Visit: Payer: Self-pay | Admitting: Family Medicine

## 2019-09-21 ENCOUNTER — Ambulatory Visit
Admission: RE | Admit: 2019-09-21 | Discharge: 2019-09-21 | Disposition: A | Discharge status: Home / Self Care | Payer: PPO | Source: Ambulatory Visit | Attending: Family Medicine | Admitting: Family Medicine

## 2019-09-21 ENCOUNTER — Ambulatory Visit
Admission: RE | Admit: 2019-09-21 | Discharge: 2019-09-21 | Disposition: A | Discharge status: Home / Self Care | Payer: PPO | Source: Ambulatory Visit | Attending: Radiation Oncology | Admitting: Radiation Oncology

## 2019-09-21 DIAGNOSIS — G8929 Other chronic pain: Secondary | ICD-10-CM

## 2019-09-21 DIAGNOSIS — M542 Cervicalgia: Secondary | ICD-10-CM | POA: Diagnosis not present

## 2019-09-21 DIAGNOSIS — C8331 Diffuse large B-cell lymphoma, lymph nodes of head, face, and neck: Secondary | ICD-10-CM | POA: Diagnosis not present

## 2019-09-24 ENCOUNTER — Encounter: Payer: Self-pay | Admitting: *Deleted

## 2019-09-24 ENCOUNTER — Other Ambulatory Visit: Payer: Self-pay

## 2019-09-24 ENCOUNTER — Ambulatory Visit
Admission: RE | Admit: 2019-09-24 | Discharge: 2019-09-24 | Disposition: A | Discharge status: Home / Self Care | Payer: PPO | Source: Ambulatory Visit | Attending: Radiation Oncology | Admitting: Radiation Oncology

## 2019-09-24 ENCOUNTER — Encounter: Payer: Self-pay | Admitting: Radiation Oncology

## 2019-09-24 DIAGNOSIS — C8331 Diffuse large B-cell lymphoma, lymph nodes of head, face, and neck: Secondary | ICD-10-CM | POA: Insufficient documentation

## 2019-09-24 DIAGNOSIS — Z510 Encounter for antineoplastic radiation therapy: Secondary | ICD-10-CM | POA: Diagnosis not present

## 2019-09-25 NOTE — Progress Notes (Signed)
Oncology Nurse Navigator Documentation  Met with Ms. Artist following her final RT to celebrate end of radiation treatment.    Provided verbal/written post-RT guidance:  Importance of keeping all follow-up appts.  Importance of protecting treatment area from sun.  Continuation of Sonafine application 2-3 times daily, application of abx ointment to areas of raw skin; when supply of Sonafine exhausted transition to OTC lotion with vitamin E.  Provided/reviewed Epic calendar of upcoming appts.  Explained my role as navigator will continue for several more months, I will be calling or joining her during follow-up visits.    She agreed to call me with needs/concerns.    [name], RN, BSN Head & Neck Oncology [place] [facility] [place] [phone]

## 2019-09-25 NOTE — Progress Notes (Signed)
  Patient Name: [name] MRN: [id number] DOB: Feb 14, 1941 Referring Physician: Tish Men Date of Service: 09/24/2019 [facility], [place]                                                        End Of Treatment Note  Diagnoses: C83.31-Diffuse large b-cell lymphoma, lymph nodes of head, face, and neck  Cancer Staging: Cancer Staging DLBCL (diffuse large B cell lymphoma) ([place]) Staging form: Hodgkin and Non-Hodgkin Lymphoma, AJCC 8th Edition - Clinical: Stage III - Signed by Tish Men, MD on 08/01/2019   Intent: Curative  Radiation Treatment Dates: 08/20/2019 through 09/24/2019 Site Technique Total Dose (Gy) Dose per Fx (Gy) Completed Fx Beam Energies  Neck: HN_L_lower IMRT 45/45 1.8 25/25 6X   Narrative: The patient tolerated radiation therapy relatively well.   Plan: The patient will follow-up with radiation oncology in a half month, or as needed. -----------------------------------  Eppie Gibson, MD

## 2019-09-27 ENCOUNTER — Ambulatory Visit: Payer: PPO | Attending: Internal Medicine

## 2019-09-27 DIAGNOSIS — Z23 Encounter for immunization: Secondary | ICD-10-CM

## 2019-09-27 NOTE — Progress Notes (Signed)
   Covid-19 Vaccination Clinic  Name:  Sandra Payne    MRN: [id number] DOB: 17-Apr-1941  09/27/2019  Sandra Payne was observed post Covid-19 immunization for 15 minutes without incidence. She was provided with Vaccine Information Sheet and instruction to access the V-Safe system.   Sandra Payne was instructed to call 911 with any severe reactions post vaccine: [name] Kitchen Difficulty breathing  . Swelling of your face and throat  . A fast heartbeat  . A bad rash all over your body  . Dizziness and weakness    Immunizations Administered    Name Date Dose VIS Date Route   Pfizer COVID-19 Vaccine 09/27/2019 12:01 PM 0.3 mL 08/03/2019 Intramuscular   Manufacturer: [place]   Lot: [id number]   [place]: [id number]

## 2019-09-28 DIAGNOSIS — M4302 Spondylolysis, cervical region: Secondary | ICD-10-CM | POA: Diagnosis not present

## 2019-09-28 DIAGNOSIS — M5090 Cervical disc disorder, unspecified, unspecified cervical region: Secondary | ICD-10-CM | POA: Diagnosis not present

## 2019-10-03 ENCOUNTER — Inpatient Hospital Stay: Payer: PPO | Attending: Hematology | Admitting: Hematology

## 2019-10-03 ENCOUNTER — Encounter: Payer: Self-pay | Admitting: Hematology

## 2019-10-03 ENCOUNTER — Inpatient Hospital Stay: Payer: PPO

## 2019-10-03 ENCOUNTER — Other Ambulatory Visit: Payer: PPO

## 2019-10-03 ENCOUNTER — Other Ambulatory Visit: Payer: Self-pay

## 2019-10-03 VITALS — BP 118/74 | HR 92 | Temp 98.2°F | Resp 18 | Ht <= 58 in | Wt 123.5 lb

## 2019-10-03 DIAGNOSIS — C8331 Diffuse large B-cell lymphoma, lymph nodes of head, face, and neck: Secondary | ICD-10-CM

## 2019-10-03 DIAGNOSIS — M79675 Pain in left toe(s): Secondary | ICD-10-CM | POA: Diagnosis not present

## 2019-10-03 DIAGNOSIS — G620 Drug-induced polyneuropathy: Secondary | ICD-10-CM | POA: Diagnosis not present

## 2019-10-03 DIAGNOSIS — C8330 Diffuse large B-cell lymphoma, unspecified site: Secondary | ICD-10-CM

## 2019-10-03 DIAGNOSIS — T451X5A Adverse effect of antineoplastic and immunosuppressive drugs, initial encounter: Secondary | ICD-10-CM | POA: Insufficient documentation

## 2019-10-03 DIAGNOSIS — Z95828 Presence of other vascular implants and grafts: Secondary | ICD-10-CM

## 2019-10-03 DIAGNOSIS — D6481 Anemia due to antineoplastic chemotherapy: Secondary | ICD-10-CM | POA: Diagnosis not present

## 2019-10-03 DIAGNOSIS — Z791 Long term (current) use of non-steroidal anti-inflammatories (NSAID): Secondary | ICD-10-CM | POA: Diagnosis not present

## 2019-10-03 DIAGNOSIS — G8929 Other chronic pain: Secondary | ICD-10-CM | POA: Insufficient documentation

## 2019-10-03 DIAGNOSIS — Z79899 Other long term (current) drug therapy: Secondary | ICD-10-CM | POA: Diagnosis not present

## 2019-10-03 DIAGNOSIS — K219 Gastro-esophageal reflux disease without esophagitis: Secondary | ICD-10-CM | POA: Diagnosis not present

## 2019-10-03 LAB — CBC WITH DIFFERENTIAL (CANCER CENTER ONLY)
Abs Immature Granulocytes: 0.03 10*3/uL (ref 0.00–0.07)
Basophils Absolute: 0 10*3/uL (ref 0.0–0.1)
Basophils Relative: 0 %
Eosinophils Absolute: 0.2 10*3/uL (ref 0.0–0.5)
Eosinophils Relative: 3 %
HCT: 36.7 % (ref 36.0–46.0)
Hemoglobin: 11.8 g/dL — ABNORMAL LOW (ref 12.0–15.0)
Immature Granulocytes: 1 %
Lymphocytes Relative: 24 %
Lymphs Abs: 1.2 10*3/uL (ref 0.7–4.0)
MCH: 29.3 pg (ref 26.0–34.0)
MCHC: 32.2 g/dL (ref 30.0–36.0)
MCV: 91.1 fL (ref 80.0–100.0)
Monocytes Absolute: 0.8 10*3/uL (ref 0.1–1.0)
Monocytes Relative: 16 %
Neutro Abs: 3 10*3/uL (ref 1.7–7.7)
Neutrophils Relative %: 56 %
Platelet Count: 238 10*3/uL (ref 150–400)
RBC: 4.03 MIL/uL (ref 3.87–5.11)
RDW: 14.7 % (ref 11.5–15.5)
WBC Count: 5.2 10*3/uL (ref 4.0–10.5)
nRBC: 0 % (ref 0.0–0.2)

## 2019-10-03 LAB — CMP (CANCER CENTER ONLY)
ALT: 16 U/L (ref 0–44)
AST: 17 U/L (ref 15–41)
Albumin: 3.7 g/dL (ref 3.5–5.0)
Alkaline Phosphatase: 79 U/L (ref 38–126)
Anion gap: 9 (ref 5–15)
BUN: 13 mg/dL (ref 8–23)
CO2: 26 mmol/L (ref 22–32)
Calcium: 9 mg/dL (ref 8.9–10.3)
Chloride: 107 mmol/L (ref 98–111)
Creatinine: 0.66 mg/dL (ref 0.44–1.00)
GFR, Est AFR Am: >60 mL/min (ref >60)
GFR, Estimated: >60 mL/min (ref >60)
Glucose, Bld: 87 mg/dL (ref 70–99)
Potassium: 3.9 mmol/L (ref 3.5–5.1)
Sodium: 142 mmol/L (ref 135–145)
Total Bilirubin: 0.2 mg/dL — ABNORMAL LOW (ref 0.3–1.2)
Total Protein: 6.6 g/dL (ref 6.5–8.1)

## 2019-10-03 LAB — LACTATE DEHYDROGENASE: LDH: 175 U/L (ref 98–192)

## 2019-10-03 MED ORDER — HEPARIN SOD (PORK) LOCK FLUSH 100 UNIT/ML IV SOLN
500.0000 [IU] | Freq: Once | INTRAVENOUS | Status: AC | PRN
  Administered 2019-10-03: 500 [IU]
  Filled 2019-10-03: qty 5

## 2019-10-03 MED ORDER — SODIUM CHLORIDE 0.9% FLUSH
10.0000 mL | INTRAVENOUS | Status: DC | PRN
  Administered 2019-10-03: 15:00:00 10 mL
  Filled 2019-10-03: qty 10

## 2019-10-03 NOTE — Progress Notes (Signed)
[facility] OFFICE PROGRESS NOTE  Patient Care Team: Jonathon Jordan, MD as PCP - General (Family Medicine) Tish Men, MD as Consulting Physician (Hematology) Eppie Gibson, MD as Attending Physician (Radiation Oncology) Leota Sauers, RN as Oncology Nurse Navigator Jennet Maduro, [place] as Dietitian (Dietician) Izora Gala, MD as Consulting Physician (Otolaryngology)  HEME/ONC OVERVIEW: 1. 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 BCL2 rearrangement positive, no BCL6 or Myc rearrangement -03/2019: left supraclavicular (bulky), left axillary, RP, mesenteric and iliac adenopathy on PET; bone marrow bx negative for lymphoma  -Mid-03/2019 - late 06/2019: R-CHOP with G-CSF support   EOT PET showed decreased but residual left thoracic inlet LN disease (Deauville 5); resolution of other sites of disease  -Late 07/2019 - 09/2019: ISRT, 45 Gy/25 fractions   2. 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  No other cervical adenopathy palpated.   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 NM PET Image Restag (PS) Skull Base To Thigh    Standing Status:   Future    Standing Expiration Date:   10/02/2020    Scheduling Instructions:     Pls schedule prior to 11/07/2019    Order Specific Question:   ** REASON FOR EXAM (FREE TEXT)    Answer:   Completed chemo and ISRT, assess for residual disease    Order Specific Question:   If indicated for the ordered procedure, I authorize the administration of a radiopharmaceutical per Radiology protocol    Answer:   Yes    Order Specific Question:   Preferred imaging location?    Answer:   [facility]    Order Specific Question:   Radiology Contrast Protocol - do NOT remove file path    Answer:   [email or web address]  . CBC with Differential ([place] Only)    Standing Status:   Future    Standing Expiration Date:   11/06/2020  . CMP ([place] only)    Standing Status:   Future    Standing Expiration Date:   11/06/2020  . Lactate dehydrogenase    Standing Status:   Future    Standing Expiration Date:   11/06/2020  . Ambulatory referral to Podiatry    Referral Priority:   Routine    Referral Type:   Consultation    Referral Reason:   Specialty Services Required    Requested Specialty:   Podiatry  Number of Visits Requested:   1   The total time spent in the encounter was 32 minutes, including face-to-face time with the patient, review of various tests results, order additional studies/medications, documentation, and coordination of care plan.   All questions were answered. The patient knows to call the clinic with any problems, questions or concerns. No barriers to learning was detected.  Return on 11/07/2019 for labs, port flush and  clinic appt.   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  Patient completed her radiation treatment on 09/24/2019.  She tolerated treatment well, and denies any significant side effects.  She has some mild residual discoloration of her nailbeds in her hands.  She reports mild chronic pain in her medial aspect of her 1st digit in the left foot, but she denies any numbness, tingling, or burning sensation in extremities.  She denies any other complaint today.  REVIEW OF SYSTEMS:   Constitutional: ( - ) fevers, ( - )  chills , ( - ) night sweats Eyes: ( - ) blurriness of vision, ( - ) double vision, ( - ) watery eyes Ears, nose, mouth, throat, and face: ( - ) mucositis, ( - ) sore throat Respiratory: ( - ) cough, ( - ) dyspnea, ( - ) wheezes Cardiovascular: ( - ) palpitation, ( - ) chest discomfort, ( - ) lower extremity swelling Gastrointestinal:  ( - ) nausea, ( - ) heartburn, ( - ) change in bowel habits Skin: ( - ) abnormal skin rashes Lymphatics: ( - ) new lymphadenopathy, ( - ) easy bruising Neurological: ( - ) numbness, ( - ) tingling, ( - ) new weaknesses Behavioral/Psych: ( - ) mood change, ( - ) new changes  All other systems were reviewed with the patient and are negative.  SUMMARY OF ONCOLOGIC HISTORY: Oncology History  DLBCL (diffuse large B cell lymphoma) ([place])  02/19/2019 Imaging   CT neck: IMPRESSION: 1. 7 cm left lower neck mass extending into the superior mediastinum most concerning for malignancy. This may reflect a conglomerate nodal mass or primary tumor of uncertain origin. 2. Additional enlarged lymph nodes adjacent to the mass in the left supraclavicular/retroclavicular and left subpectoral regions. 3.  Aortic Atherosclerosis (ICD10-I70.0).   03/12/2019 Procedure   Incisional left cervical  LN bx by Dr. Constance Holster   03/12/2019 Pathology Results   Accession: [id number] Soft tissue mass, simple excision, Left Neck - DIFFUSE LARGE B-CELL LYMPHOMA - SEE COMMENT   03/19/2019 Initial Diagnosis   DLBCL (diffuse large B cell lymphoma) ([place])   03/29/2019 Imaging   PET: IMPRESSION: 1. The large left supraclavicular mass is intensely hypermetabolic compatible with history of lymphoma. There are also nearby FDG avid left supraclavicular, left axillary, and left retroperitoneal lymph nodes. 2. Hypermetabolic retroperitoneal, mesenteric and iliac adenopathy. 3. Aortic Atherosclerosis (ICD10-I70.0). Coronary artery calcifications.   04/04/2019 -  Chemotherapy   The patient had DOXOrubicin (ADRIAMYCIN) chemo injection 80 mg, 50 mg/m2 = 80 mg, Intravenous,  Once, 6 of 6 cycles Administration: 80 mg (04/04/2019), 80 mg (04/25/2019), 80 mg (05/16/2019), 80 mg (06/06/2019), 80 mg (06/27/2019), 80 mg (07/18/2019) palonosetron (ALOXI) injection 0.25 mg, 0.25 mg, Intravenous,  Once, 6 of 6 cycles Administration: 0.25 mg (04/04/2019), 0.25 mg (04/25/2019), 0.25 mg (05/16/2019), 0.25 mg (06/06/2019), 0.25 mg (06/27/2019), 0.25 mg (07/18/2019) pegfilgrastim-jmdb (FULPHILA) injection 6  mg, 6 mg, Subcutaneous,  Once, 6 of 6 cycles Administration: 6 mg (04/06/2019), 6 mg (04/27/2019), 6 mg (05/18/2019), 6 mg (06/08/2019), 6 mg (06/29/2019), 6 mg (07/20/2019) vinCRIStine (ONCOVIN) 2 mg in sodium chloride 0.9 % 50 mL chemo infusion, 2 mg, Intravenous,  Once, 6 of 6 cycles Administration: 2 mg (04/04/2019), 2 mg (04/25/2019), 2 mg (05/16/2019), 2 mg (06/06/2019), 2 mg (06/27/2019), 2 mg (07/18/2019) riTUXimab (RITUXAN) 600 mg in sodium chloride 0.9 % 250 mL (1.9355 mg/mL) infusion, 375 mg/m2 = 600 mg, Intravenous,  Once, 1 of 1 cycle Administration: 600 mg (04/04/2019) cyclophosphamide (CYTOXAN) 1,180 mg in sodium chloride 0.9 % 250 mL chemo infusion, 750 mg/m2 = 1,180 mg, Intravenous,  Once, 6 of 6 cycles Dose modification: 600  mg/m2 (original dose 750 mg/m2, Cycle 6, Reason: Dose not tolerated) Administration: 1,180 mg (04/04/2019), 1,180 mg (04/25/2019), 1,180 mg (05/16/2019), 1,180 mg (06/06/2019), 1,180 mg (06/27/2019), 940 mg (07/18/2019) riTUXimab (RITUXAN) 600 mg in sodium chloride 0.9 % 190 mL infusion, 375 mg/m2 = 600 mg, Intravenous,  Once, 5 of 5 cycles Administration: 600 mg (04/25/2019), 600 mg (05/16/2019), 600 mg (06/06/2019), 600 mg (06/27/2019), 600 mg (07/18/2019) fosaprepitant (EMEND) 150 mg, dexamethasone (DECADRON) 12 mg in sodium chloride 0.9 % 145 mL IVPB, , Intravenous,  Once, 6 of 6 cycles Administration:  (04/04/2019),  (04/25/2019),  (05/16/2019),  (06/06/2019),  (06/27/2019),  (07/18/2019)  for chemotherapy treatment.    06/12/2019 Imaging   PET (after 4 cycles of R-CHOP): IMPRESSION: 1. Partial treatment response. Dominant left supraclavicular nodal mass is decreased in size and metabolism, with persistent Deauville score 5 uptake. Previously visualized hypermetabolic left retropectoral, retroperitoneal and lower mesenteric lymph nodes have resolved. 2. Stable low level splenic hypermetabolism, nonspecific, possibly reactive. Normal size spleen. No splenic masses. 3. Low level hypermetabolism throughout the skeleton, compatible with mildly stimulated marrow state. 4. Asymmetric right glottic uptake without CT mass correlate, cannot exclude left focal cord paralysis. 5.  Aortic Atherosclerosis (ICD10-I70.0).   07/30/2019 Imaging   EOT PET: IMPRESSION: 1. Partial metabolic response to therapy of nodal mass within the left side of the thoracic inlet. (Deauville) 5. 2. No new sites of disease identified. 3. Posterior right upper lobe ground-glass nodule versus area of subsegmental atelectasis. Recommend attention on follow-up. 4. 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 5. Coronary artery atherosclerosis. Aortic Atherosclerosis (ICD10-I70.0).   08/01/2019 Cancer  Staging   Staging form: Hodgkin and Non-Hodgkin Lymphoma, AJCC 8th Edition - Clinical: Stage III - Signed by Tish Men, MD on 08/01/2019     I have reviewed the past medical history, past surgical history, social history and family history with the patient and they are unchanged from previous note.  ALLERGIES:  is allergic to codeine and sulfa antibiotics.  MEDICATIONS:  Current Outpatient Medications  Medication Sig Dispense Refill  . acetaminophen (TYLENOL) 325 MG tablet Take 650 mg by mouth every 6 (six) hours as needed for mild pain or headache.     . albuterol (VENTOLIN HFA) 108 (90 Base) MCG/ACT inhaler Inhale 2 puffs into the lungs every 6 (six) hours as needed.    [name] Kitchen allopurinol (ZYLOPRIM) 300 MG tablet Take 1 tablet (300 mg total) by mouth daily. 30 tablet 3  . celecoxib (CELEBREX) 200 MG capsule Take 200 mg by mouth every other day.    . DULoxetine (CYMBALTA) 30 MG capsule 1 CAPSULE ONCE A DAY DAILY, MAY INCREASE TO TWICE A DAY AFTER 7 DAYS IF NOT BETTER    . lidocaine-prilocaine (EMLA) cream  Apply to affected area once 30 g 3  . metoCLOPramide (REGLAN) 10 MG tablet Take 1 tablet (10 mg total) by mouth every 8 (eight) hours as needed for nausea. 40 tablet 0  . Multiple Vitamin (MULTIVITAMIN) tablet Take 1 tablet by mouth daily.    . pegfilgrastim (NEULASTA ONPRO KIT) 6 MG/0.6ML injection Inject 6 mg into the skin as directed. Inject via provided programmed delivery device.  Every 3 weeks    . polyvinyl alcohol (LIQUIFILM TEARS) 1.4 % ophthalmic solution Place 1 drop into both eyes as needed for dry eyes (allergies).    . traZODone (DESYREL) 50 MG tablet     . gabapentin (NEURONTIN) 400 MG capsule Take 1 capsule (400 mg total) by mouth 2 (two) times daily. 60 capsule 2  . LORazepam (ATIVAN) 0.5 MG tablet Take 1 tablet (0.5 mg total) by mouth 2 (two) times daily as needed (for refractory nausea and vomiting). (Patient not taking: Reported on 07/18/2019) 30 tablet 0  . ondansetron  (ZOFRAN) 8 MG tablet Take 1 tablet (8 mg total) by mouth 2 (two) times daily as needed for refractory nausea / vomiting. Start on day 3 after cyclophosphamide chemotherapy. (Patient not taking: Reported on 07/10/2019) 30 tablet 1  . pantoprazole (PROTONIX) 20 MG tablet Take 1 tablet (20 mg total) by mouth daily. 30 tablet 5  . prochlorperazine (COMPAZINE) 10 MG tablet Take 1 tablet (10 mg total) by mouth every 6 (six) hours as needed (Nausea or vomiting). (Patient not taking: Reported on 09/05/2019) 30 tablet 6  . traMADol (ULTRAM) 50 MG tablet Take 50 mg by mouth every 6 (six) hours as needed for moderate pain.      No current facility-administered medications for this visit.    PHYSICAL EXAMINATION: ECOG PERFORMANCE STATUS: 2 - Symptomatic, <50% confined to bed  Today's Vitals   10/03/19 1450 10/03/19 1456  BP: 118/74   Pulse: 92   Resp: 18   Temp: 98.2 F (36.8 C)   TempSrc: Temporal   SpO2: 99%   Weight: 123 lb 8 oz (56 kg)   Height: '4\' 10"'  (1.473 m)   PainSc:  0-No pain   Body mass index is 25.81 kg/m.  Filed Weights   10/03/19 1450  Weight: 123 lb 8 oz (56 kg)    GENERAL: alert, no distress and comfortable SKIN: skin color, texture, turgor are normal, no rashes or significant lesions EYES: conjunctiva are pink and non-injected, sclera clear OROPHARYNX: no exudate, no erythema; lips, buccal mucosa, and tongue normal  NECK: left supraclavicular fullness without definite enlarged LN's  LUNGS: clear to auscultation with normal breathing effort HEART: regular rate & rhythm and no murmurs and no lower extremity edema ABDOMEN: soft, non-tender, non-distended, normal bowel sounds Musculoskeletal: no cyanosis of digits and no clubbing  PSYCH: alert & oriented x 3, fluent speech  LABORATORY DATA:  I have reviewed the data as listed    Component Value Date/Time   NA 139 09/05/2019 1340   NA 137 01/16/2015 0000   K 4.0 09/05/2019 1340   CL 105 09/05/2019 1340   CO2 24  09/05/2019 1340   GLUCOSE 81 09/05/2019 1340   BUN 13 09/05/2019 1340   BUN 12 01/16/2015 0000   CREATININE 0.64 09/05/2019 1340   CALCIUM 9.4 09/05/2019 1340   PROT 6.8 09/05/2019 1340   ALBUMIN 3.9 09/05/2019 1340   AST 25 09/05/2019 1340   ALT 26 09/05/2019 1340   ALKPHOS 70 09/05/2019 1340   BILITOT 0.4 09/05/2019 1340  GFRNONAA >60 09/05/2019 1340   GFRAA >60 09/05/2019 1340    No results found for: SPEP, UPEP  Lab Results  Component Value Date   WBC 5.2 10/03/2019   NEUTROABS 3.0 10/03/2019   HGB 11.8 (L) 10/03/2019   HCT 36.7 10/03/2019   MCV 91.1 10/03/2019   PLT 238 10/03/2019      Chemistry      Component Value Date/Time   NA 139 09/05/2019 1340   NA 137 01/16/2015 0000   K 4.0 09/05/2019 1340   CL 105 09/05/2019 1340   CO2 24 09/05/2019 1340   BUN 13 09/05/2019 1340   BUN 12 01/16/2015 0000   CREATININE 0.64 09/05/2019 1340   GLU 96 01/16/2015 0000      Component Value Date/Time   CALCIUM 9.4 09/05/2019 1340   ALKPHOS 70 09/05/2019 1340   AST 25 09/05/2019 1340   ALT 26 09/05/2019 1340   BILITOT 0.4 09/05/2019 1340       RADIOGRAPHIC STUDIES: I have personally reviewed the radiological images as listed below and agreed with the findings in the report. DG Cervical Spine Complete  Result Date: 09/21/2019 CLINICAL DATA:  Chronic neck pain EXAM: CERVICAL SPINE - COMPLETE 4+ VIEW COMPARISON:  08/31/2012 FINDINGS: Straightening of the cervical spine. Vertebral body heights and prevertebral soft tissue thickness appear normal. Moderate severe diffuse degenerative change C3 through T1, progressed since 2014 comparison radiograph. Bilateral foraminal stenosis C3 through C7. Dens and lateral masses are within normal limits. IMPRESSION: Straightening of the cervical spine with moderate severe diffuse degenerative change C3 through T1. Electronically Signed   By: Donavan Foil M.D.   On: 09/21/2019 19:52

## 2019-10-05 ENCOUNTER — Ambulatory Visit: Payer: PPO

## 2019-10-09 ENCOUNTER — Other Ambulatory Visit: Payer: Self-pay

## 2019-10-09 ENCOUNTER — Ambulatory Visit (INDEPENDENT_AMBULATORY_CARE_PROVIDER_SITE_OTHER): Payer: PPO | Admitting: Podiatry

## 2019-10-09 ENCOUNTER — Ambulatory Visit: Payer: PPO

## 2019-10-09 ENCOUNTER — Encounter: Payer: Self-pay | Admitting: Podiatry

## 2019-10-09 DIAGNOSIS — M79675 Pain in left toe(s): Secondary | ICD-10-CM | POA: Diagnosis not present

## 2019-10-09 DIAGNOSIS — B351 Tinea unguium: Secondary | ICD-10-CM

## 2019-10-09 DIAGNOSIS — L600 Ingrowing nail: Secondary | ICD-10-CM

## 2019-10-09 DIAGNOSIS — M79674 Pain in right toe(s): Secondary | ICD-10-CM | POA: Diagnosis not present

## 2019-10-10 ENCOUNTER — Encounter: Payer: Self-pay | Admitting: Radiation Oncology

## 2019-10-12 ENCOUNTER — Encounter: Payer: Self-pay | Admitting: Radiation Oncology

## 2019-10-12 ENCOUNTER — Ambulatory Visit
Admission: RE | Admit: 2019-10-12 | Discharge: 2019-10-12 | Disposition: A | Discharge status: Home / Self Care | Payer: PPO | Source: Ambulatory Visit | Attending: Radiation Oncology | Admitting: Radiation Oncology

## 2019-10-12 DIAGNOSIS — C8331 Diffuse large B-cell lymphoma, lymph nodes of head, face, and neck: Secondary | ICD-10-CM

## 2019-10-12 HISTORY — DX: Personal history of irradiation: Z92.3

## 2019-10-12 NOTE — Progress Notes (Signed)
Radiation Oncology         [phone] ________________________________  Name: Sandra Payne MRN: [id number]  Date: 10/12/2019  DOB: 1940/11/25  Follow-Up Visit Note by phone  CC: Jonathon Jordan, MD  Tish Men, MD  Diagnosis and Prior Radiotherapy:       ICD-10-CM   1. Diffuse large B-cell lymphoma of lymph nodes of neck (HCC)  C83.31     08/20/2019 through 09/24/2019  Site Technique Total Dose (Gy) Dose per Fx (Gy) Completed Fx Beam Energies  Neck: HN_L_lower IMRT 45/45 1.8 25/25 6X    CHIEF COMPLAINT:  Here for follow-up and surveillance of lymphoma  Narrative:  The patient returns today for routine follow-up. She last saw Dr. Maylon Peppers on 10/03/2019. The plan is to repeat PET can in mid-March. She is doing well. Her skin has cleared up. She is using Vit E Oil on it. No pain.  Her voice is still "weak" and she still feels a little lump when she swallows. She still has some indigestion and takes pantoprazole. Eating well.  ALLERGIES:  is allergic to codeine and sulfa antibiotics.  Meds: Current Outpatient Medications  Medication Sig Dispense Refill  . acetaminophen (TYLENOL) 325 MG tablet Take 650 mg by mouth every 6 (six) hours as needed for mild pain or headache.     . albuterol (VENTOLIN HFA) 108 (90 Base) MCG/ACT inhaler Inhale 2 puffs into the lungs every 6 (six) hours as needed.    [name] Kitchen allopurinol (ZYLOPRIM) 300 MG tablet Take 1 tablet (300 mg total) by mouth daily. 30 tablet 3  . celecoxib (CELEBREX) 200 MG capsule Take 200 mg by mouth every other day.    . DULoxetine (CYMBALTA) 30 MG capsule 1 CAPSULE ONCE A DAY DAILY, MAY INCREASE TO TWICE A DAY AFTER 7 DAYS IF NOT BETTER    . gabapentin (NEURONTIN) 400 MG capsule Take 1 capsule (400 mg total) by mouth 2 (two) times daily. 60 capsule 2  . lidocaine-prilocaine (EMLA) cream Apply to affected area once 30 g 3  . LORazepam (ATIVAN) 0.5 MG tablet Take 1 tablet (0.5 mg total) by mouth 2 (two) times daily as needed (for refractory  nausea and vomiting). 30 tablet 0  . metoCLOPramide (REGLAN) 10 MG tablet Take 1 tablet (10 mg total) by mouth every 8 (eight) hours as needed for nausea. 40 tablet 0  . Multiple Vitamin (MULTIVITAMIN) tablet Take 1 tablet by mouth daily.    . ondansetron (ZOFRAN) 8 MG tablet Take 1 tablet (8 mg total) by mouth 2 (two) times daily as needed for refractory nausea / vomiting. Start on day 3 after cyclophosphamide chemotherapy. 30 tablet 1  . pantoprazole (PROTONIX) 20 MG tablet Take 1 tablet (20 mg total) by mouth daily. 30 tablet 5  . pegfilgrastim (NEULASTA ONPRO KIT) 6 MG/0.6ML injection Inject 6 mg into the skin as directed. Inject via provided programmed delivery device.  Every 3 weeks    . polyvinyl alcohol (LIQUIFILM TEARS) 1.4 % ophthalmic solution Place 1 drop into both eyes as needed for dry eyes (allergies).    . prochlorperazine (COMPAZINE) 10 MG tablet Take 1 tablet (10 mg total) by mouth every 6 (six) hours as needed (Nausea or vomiting). 30 tablet 6  . traMADol (ULTRAM) 50 MG tablet Take 50 mg by mouth every 6 (six) hours as needed for moderate pain.     . traZODone (DESYREL) 50 MG tablet      No current facility-administered medications for this encounter.  Physical Findings:   Wt Readings from Last 3 Encounters:  10/03/19 123 lb 8 oz (56 kg)  09/05/19 119 lb 9.6 oz (54.3 kg)  08/10/19 119 lb 6.4 oz (54.2 kg)    vitals were not taken for this visit. .  General: Alert and oriented, in no acute distress Psychiatric: Judgment and insight are intact. Affect is appropriate.   Lab Findings: Lab Results  Component Value Date   WBC 5.2 10/03/2019   HGB 11.8 (L) 10/03/2019   HCT 36.7 10/03/2019   MCV 91.1 10/03/2019   PLT 238 10/03/2019    No results found for: TSH  Radiographic Findings: DG Cervical Spine Complete  Result Date: 09/21/2019 CLINICAL DATA:  Chronic neck pain EXAM: CERVICAL SPINE - COMPLETE 4+ VIEW COMPARISON:  08/31/2012 FINDINGS: Straightening of the  cervical spine. Vertebral body heights and prevertebral soft tissue thickness appear normal. Moderate severe diffuse degenerative change C3 through T1, progressed since 2014 comparison radiograph. Bilateral foraminal stenosis C3 through C7. Dens and lateral masses are within normal limits. IMPRESSION: Straightening of the cervical spine with moderate severe diffuse degenerative change C3 through T1. Electronically Signed   By: Donavan Foil M.D.   On: 09/21/2019 19:52    Impression/Plan:    1) Head and Neck Cancer Status: Healing from ISRT radiotherapy  2) Nutritional Status: States she is eating very well PEG tube: None  3)  Swallowing: She feels a small lump when she swallows but otherwise denies issues.  This may be due to residual inflammation from radiotherapy  4) Thyroid function: No results found for: TSH    I recommend that she undergo yearly thyroid lab work out of caution given radiotherapy to the partial neck.  I have communicated this with medical oncology.  5) Other: She has had hoarseness since diagnosis.  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  This may be due to demyelination of the recurrent laryngeal nerve.  She has had indigestion (esophageal sensation) since receiving chemotherapy.  If this continues through March she will let medical oncology know.  Until then she will continue pantoprazole.  6) PET scan has been scheduled for March.  She will follow up with medical oncology after that and continue intermittent follow-up with them over time.  I will see her back as needed.  I wished her the very best.  This encounter was provided by telemedicine platform by telephone as patient was unable to access MyChart video during pandemic precautions The patient has given verbal consent for this type of encounter and has been advised to only accept a meeting of this type in a secure network environment. The time spent during this encounter on date of service, in  total was 30 minutes. The attendants for this meeting include Eppie Gibson  and Elissa Hefty.  During the encounter, Eppie Gibson was located at [facility] Radiation Oncology Department.  Sandra Payne was located at home.   _____________________________________   Eppie Gibson, MD  This document serves as a record of services personally performed by Eppie Gibson, MD. It was created on her behalf by Wilburn Mylar, a trained medical scribe. The creation of this record is based on the scribe's personal observations and the provider's statements to them. This document has been checked and approved by the attending provider.

## 2019-10-15 NOTE — Progress Notes (Signed)
Subjective:   Patient ID: Sandra Payne, female   DOB: 79 y.o.   MRN: [id number]   HPI 79 year old female presents the office today for concerns of possible ingrown toenails to both of her big toes.  The right side of the lateral aspect of the left side the medial aspect.  This is been on the last 4 months.  She states they are not infected but very tender particular the pressure.  Denies any drainage or pus or any swelling.  She has no other concerns today.   Review of Systems  All other systems reviewed and are negative.  Past Medical History:  Diagnosis Date  . Anemia   . Arthralgia   . Arthritis   . Elevated cholesterol   . History of radiation therapy 08/20/19- 09/24/19   lower left neck 25 fractions of 1.8 Gy to total 45 Gy.   . Insomnia   . Lymphoma ([place])   . Neck mass    left    Past Surgical History:  Procedure Laterality Date  . ABDOMINAL HYSTERECTOMY     partial  . IR IMAGING GUIDED PORT INSERTION  03/28/2019  . MASS BIOPSY Left 03/12/2019   Procedure: NECK MASS BIOPSY;  Surgeon: Izora Gala, MD;  Location: [facility];  Service: ENT;  Laterality: Left;  . TOTAL KNEE ARTHROPLASTY Right 01/06/2015   Procedure: RIGHT TOTAL KNEE ARTHROPLASTY;  Surgeon: Frederik Pear, MD;  Location: [facility];  Service: Orthopedics;  Laterality: Right;     Current Outpatient Medications:  .  acetaminophen (TYLENOL) 325 MG tablet, Take 650 mg by mouth every 6 (six) hours as needed for mild pain or headache. , Disp: , Rfl:  .  albuterol (VENTOLIN HFA) 108 (90 Base) MCG/ACT inhaler, Inhale 2 puffs into the lungs every 6 (six) hours as needed., Disp: , Rfl:  .  allopurinol (ZYLOPRIM) 300 MG tablet, Take 1 tablet (300 mg total) by mouth daily., Disp: 30 tablet, Rfl: 3 .  celecoxib (CELEBREX) 200 MG capsule, Take 200 mg by mouth every other day., Disp: , Rfl:  .  DULoxetine (CYMBALTA) 30 MG capsule, 1 CAPSULE ONCE A DAY DAILY, MAY INCREASE TO TWICE A DAY AFTER 7 DAYS IF NOT BETTER, Disp:  , Rfl:  .  lidocaine-prilocaine (EMLA) cream, Apply to affected area once, Disp: 30 g, Rfl: 3 .  LORazepam (ATIVAN) 0.5 MG tablet, Take 1 tablet (0.5 mg total) by mouth 2 (two) times daily as needed (for refractory nausea and vomiting)., Disp: 30 tablet, Rfl: 0 .  metoCLOPramide (REGLAN) 10 MG tablet, Take 1 tablet (10 mg total) by mouth every 8 (eight) hours as needed for nausea., Disp: 40 tablet, Rfl: 0 .  Multiple Vitamin (MULTIVITAMIN) tablet, Take 1 tablet by mouth daily., Disp: , Rfl:  .  ondansetron (ZOFRAN) 8 MG tablet, Take 1 tablet (8 mg total) by mouth 2 (two) times daily as needed for refractory nausea / vomiting. Start on day 3 after cyclophosphamide chemotherapy., Disp: 30 tablet, Rfl: 1 .  pegfilgrastim (NEULASTA ONPRO KIT) 6 MG/0.6ML injection, Inject 6 mg into the skin as directed. Inject via provided programmed delivery device.  Every 3 weeks, Disp: , Rfl:  .  polyvinyl alcohol (LIQUIFILM TEARS) 1.4 % ophthalmic solution, Place 1 drop into both eyes as needed for dry eyes (allergies)., Disp: , Rfl:  .  prochlorperazine (COMPAZINE) 10 MG tablet, Take 1 tablet (10 mg total) by mouth every 6 (six) hours as needed (Nausea or vomiting)., Disp: 30 tablet, Rfl:  6 .  traMADol (ULTRAM) 50 MG tablet, Take 50 mg by mouth every 6 (six) hours as needed for moderate pain. , Disp: , Rfl:  .  traZODone (DESYREL) 50 MG tablet, , Disp: , Rfl:  .  gabapentin (NEURONTIN) 400 MG capsule, Take 1 capsule (400 mg total) by mouth 2 (two) times daily., Disp: 60 capsule, Rfl: 2 .  pantoprazole (PROTONIX) 20 MG tablet, Take 1 tablet (20 mg total) by mouth daily., Disp: 30 tablet, Rfl: 5  Allergies  Allergen Reactions  . Codeine Itching  . Sulfa Antibiotics Other (See Comments)    Childhood allergy          Objective:  Physical Exam  General: AAO x3, NAD  Dermatological: Incurvation to bilateral hallux toenails with tenderness palpation.  There is tenderness only at the distal aspect of the nail.   There is no drainage or pus there is no edema, erythema any signs of infection. The remaining nails are mildly dystrophic, discolored and elongated. No edema, erythema or signs of infection. No open lesions.   Vascular: Dorsalis Pedis artery and Posterior Tibial artery pedal pulses are 2/4 bilateral with immedate capillary fill time. There is no pain with calf compression, swelling, warmth, erythema.   Neruologic: Grossly intact via light touch bilateral. Vibratory intact via tuning fork bilateral. Protective threshold with Semmes Wienstein monofilament intact to all pedal sites bilateral. Patellar and Achilles deep tendon reflexes 2+ bilateral. No Babinski or clonus noted bilateral.   Musculoskeletal: No gross boney pedal deformities bilateral. No pain, crepitus, or limitation noted with foot and ankle range of motion bilateral. Muscular strength 5/5 in all groups tested bilateral.  Gait: Unassisted, Nonantalgic.       Assessment:   79 year old female with ingrown toenails    Plan:  -Treatment options discussed including all alternatives, risks, and complications -Etiology of symptoms were discussed -Partial nail avulsions with chemical hysterectomy but she wants to hold off on this.  I debrided the nails but any complications or bleeding.  She can use antibiotic ointment on the toenails.  Should there be signs of infection or any worsening pain then she will consider partial nail avulsion.  Debrided the other nails without any complications or bleeding.  Return in about 3 months (around 01/06/2020) for nail trim . She states she cannot do them herself  Trula Slade DPM

## 2019-10-22 ENCOUNTER — Ambulatory Visit: Payer: PPO | Attending: Internal Medicine

## 2019-10-22 DIAGNOSIS — Z23 Encounter for immunization: Secondary | ICD-10-CM | POA: Insufficient documentation

## 2019-10-22 NOTE — Progress Notes (Signed)
   Covid-19 Vaccination Clinic  Name:  Sandra Payne    MRN: [id number] DOB: Nov 30, 1940  10/22/2019  Sandra Payne was observed post Covid-19 immunization for 15 minutes without incidence. She was provided with Vaccine Information Sheet and instruction to access the V-Safe system.   Sandra Payne was instructed to call 911 with any severe reactions post vaccine: [name] Kitchen Difficulty breathing  . Swelling of your face and throat  . A fast heartbeat  . A bad rash all over your body  . Dizziness and weakness

## 2019-10-25 DIAGNOSIS — D649 Anemia, unspecified: Secondary | ICD-10-CM | POA: Diagnosis not present

## 2019-10-25 DIAGNOSIS — E785 Hyperlipidemia, unspecified: Secondary | ICD-10-CM | POA: Diagnosis not present

## 2019-10-25 DIAGNOSIS — M5090 Cervical disc disorder, unspecified, unspecified cervical region: Secondary | ICD-10-CM | POA: Diagnosis not present

## 2019-10-25 DIAGNOSIS — C8330 Diffuse large B-cell lymphoma, unspecified site: Secondary | ICD-10-CM | POA: Diagnosis not present

## 2019-10-25 DIAGNOSIS — Z131 Encounter for screening for diabetes mellitus: Secondary | ICD-10-CM | POA: Diagnosis not present

## 2019-10-25 DIAGNOSIS — M4302 Spondylolysis, cervical region: Secondary | ICD-10-CM | POA: Diagnosis not present

## 2019-10-25 DIAGNOSIS — I700 Atherosclerosis of aorta: Secondary | ICD-10-CM | POA: Diagnosis not present

## 2019-10-25 DIAGNOSIS — Z0000 Encounter for general adult medical examination without abnormal findings: Secondary | ICD-10-CM | POA: Diagnosis not present

## 2019-10-25 DIAGNOSIS — E559 Vitamin D deficiency, unspecified: Secondary | ICD-10-CM | POA: Diagnosis not present

## 2019-10-25 DIAGNOSIS — M1990 Unspecified osteoarthritis, unspecified site: Secondary | ICD-10-CM | POA: Diagnosis not present

## 2019-10-25 DIAGNOSIS — G894 Chronic pain syndrome: Secondary | ICD-10-CM | POA: Diagnosis not present

## 2019-10-25 DIAGNOSIS — Z79899 Other long term (current) drug therapy: Secondary | ICD-10-CM | POA: Diagnosis not present

## 2019-11-05 ENCOUNTER — Ambulatory Visit (HOSPITAL_COMMUNITY)
Admission: RE | Admit: 2019-11-05 | Discharge: 2019-11-05 | Disposition: A | Discharge status: Home / Self Care | Payer: PPO | Source: Ambulatory Visit | Attending: Hematology | Admitting: Hematology

## 2019-11-05 ENCOUNTER — Other Ambulatory Visit: Payer: Self-pay

## 2019-11-05 DIAGNOSIS — C8330 Diffuse large B-cell lymphoma, unspecified site: Secondary | ICD-10-CM | POA: Diagnosis not present

## 2019-11-05 DIAGNOSIS — K5730 Diverticulosis of large intestine without perforation or abscess without bleeding: Secondary | ICD-10-CM | POA: Diagnosis not present

## 2019-11-05 DIAGNOSIS — I2510 Atherosclerotic heart disease of native coronary artery without angina pectoris: Secondary | ICD-10-CM | POA: Insufficient documentation

## 2019-11-05 DIAGNOSIS — C8331 Diffuse large B-cell lymphoma, lymph nodes of head, face, and neck: Secondary | ICD-10-CM | POA: Diagnosis not present

## 2019-11-05 DIAGNOSIS — Z5111 Encounter for antineoplastic chemotherapy: Secondary | ICD-10-CM | POA: Diagnosis not present

## 2019-11-05 LAB — GLUCOSE, CAPILLARY: Glucose-Capillary: 94 mg/dL (ref 70–99)

## 2019-11-05 MED ORDER — FLUDEOXYGLUCOSE F - 18 (FDG) INJECTION
6.2200 | Freq: Once | INTRAVENOUS | Status: AC | PRN
  Administered 2019-11-05: 6.22 via INTRAVENOUS

## 2019-11-07 ENCOUNTER — Other Ambulatory Visit: Payer: Self-pay

## 2019-11-07 ENCOUNTER — Encounter: Payer: Self-pay | Admitting: Hematology

## 2019-11-07 ENCOUNTER — Inpatient Hospital Stay: Payer: PPO | Attending: Hematology | Admitting: Hematology

## 2019-11-07 ENCOUNTER — Inpatient Hospital Stay: Payer: PPO

## 2019-11-07 ENCOUNTER — Telehealth: Payer: Self-pay | Admitting: Hematology

## 2019-11-07 VITALS — BP 124/72 | HR 93 | Temp 98.2°F | Resp 18 | Ht <= 58 in | Wt 128.5 lb

## 2019-11-07 DIAGNOSIS — C8338 Diffuse large B-cell lymphoma, lymph nodes of multiple sites: Secondary | ICD-10-CM | POA: Diagnosis not present

## 2019-11-07 DIAGNOSIS — Z79899 Other long term (current) drug therapy: Secondary | ICD-10-CM | POA: Diagnosis not present

## 2019-11-07 DIAGNOSIS — C8331 Diffuse large B-cell lymphoma, lymph nodes of head, face, and neck: Secondary | ICD-10-CM | POA: Diagnosis not present

## 2019-11-07 DIAGNOSIS — Z95828 Presence of other vascular implants and grafts: Secondary | ICD-10-CM

## 2019-11-07 DIAGNOSIS — Z791 Long term (current) use of non-steroidal anti-inflammatories (NSAID): Secondary | ICD-10-CM | POA: Insufficient documentation

## 2019-11-07 DIAGNOSIS — Z452 Encounter for adjustment and management of vascular access device: Secondary | ICD-10-CM | POA: Insufficient documentation

## 2019-11-07 DIAGNOSIS — Z923 Personal history of irradiation: Secondary | ICD-10-CM | POA: Diagnosis not present

## 2019-11-07 DIAGNOSIS — Z9221 Personal history of antineoplastic chemotherapy: Secondary | ICD-10-CM | POA: Diagnosis not present

## 2019-11-07 LAB — CBC WITH DIFFERENTIAL (CANCER CENTER ONLY)
Abs Immature Granulocytes: 0.04 10*3/uL (ref 0.00–0.07)
Basophils Absolute: 0 10*3/uL (ref 0.0–0.1)
Basophils Relative: 0 %
Eosinophils Absolute: 0.5 10*3/uL (ref 0.0–0.5)
Eosinophils Relative: 10 %
HCT: 38.3 % (ref 36.0–46.0)
Hemoglobin: 12.3 g/dL (ref 12.0–15.0)
Immature Granulocytes: 1 %
Lymphocytes Relative: 26 %
Lymphs Abs: 1.3 10*3/uL (ref 0.7–4.0)
MCH: 27.8 pg (ref 26.0–34.0)
MCHC: 32.1 g/dL (ref 30.0–36.0)
MCV: 86.7 fL (ref 80.0–100.0)
Monocytes Absolute: 0.7 10*3/uL (ref 0.1–1.0)
Monocytes Relative: 15 %
Neutro Abs: 2.4 10*3/uL (ref 1.7–7.7)
Neutrophils Relative %: 48 %
Platelet Count: 266 10*3/uL (ref 150–400)
RBC: 4.42 MIL/uL (ref 3.87–5.11)
RDW: 14.4 % (ref 11.5–15.5)
WBC Count: 4.8 10*3/uL (ref 4.0–10.5)
nRBC: 0 % (ref 0.0–0.2)

## 2019-11-07 LAB — CMP (CANCER CENTER ONLY)
ALT: 12 U/L (ref 0–44)
AST: 22 U/L (ref 15–41)
Albumin: 3.5 g/dL (ref 3.5–5.0)
Alkaline Phosphatase: 78 U/L (ref 38–126)
Anion gap: 8 (ref 5–15)
BUN: 10 mg/dL (ref 8–23)
CO2: 23 mmol/L (ref 22–32)
Calcium: 8.8 mg/dL — ABNORMAL LOW (ref 8.9–10.3)
Chloride: 111 mmol/L (ref 98–111)
Creatinine: 0.67 mg/dL (ref 0.44–1.00)
GFR, Est AFR Am: >60 mL/min (ref >60)
GFR, Estimated: >60 mL/min (ref >60)
Glucose, Bld: 101 mg/dL — ABNORMAL HIGH (ref 70–99)
Potassium: 3.7 mmol/L (ref 3.5–5.1)
Sodium: 142 mmol/L (ref 135–145)
Total Bilirubin: 0.4 mg/dL (ref 0.3–1.2)
Total Protein: 6.5 g/dL (ref 6.5–8.1)

## 2019-11-07 LAB — LACTATE DEHYDROGENASE: LDH: 203 U/L — ABNORMAL HIGH (ref 98–192)

## 2019-11-07 MED ORDER — HEPARIN SOD (PORK) LOCK FLUSH 100 UNIT/ML IV SOLN
500.0000 [IU] | Freq: Once | INTRAVENOUS | Status: AC | PRN
  Administered 2019-11-07: 500 [IU]
  Filled 2019-11-07: qty 5

## 2019-11-07 MED ORDER — SODIUM CHLORIDE 0.9% FLUSH
10.0000 mL | INTRAVENOUS | Status: DC | PRN
  Administered 2019-11-07: 10 mL
  Filled 2019-11-07: qty 10

## 2019-11-07 NOTE — Progress Notes (Signed)
[facility] OFFICE PROGRESS NOTE  Patient Care Team: Jonathon Jordan, MD as PCP - General (Family Medicine) Tish Men, MD as Consulting Physician (Hematology) Eppie Gibson, MD as Attending Physician (Radiation Oncology) Leota Sauers, RN as Oncology Nurse Navigator Jennet Maduro, [place] as Dietitian (Dietician) Izora Gala, MD as Consulting Physician (Otolaryngology)  HEME/ONC OVERVIEW: 1. 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 BCL2 rearrangement positive, no BCL6 or Myc rearrangement -03/2019: left supraclavicular (bulky), left axillary, RP, mesenteric and iliac adenopathy on PET; bone marrow bx negative for lymphoma  -Mid-03/2019 - late 06/2019: R-CHOP with G-CSF support   EOT PET showed decreased but residual left thoracic inlet LN disease (Deauville 5); resolution of other sites of disease  -Late 07/2019 - 09/2019: ISRT, 45 Gy/25 fractions   Interim PET showed resolving FDG avidity in the LN's in the thoracic inlet (Deauville 3); no new disease   2. 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 The images were also reviewed extensively at the H&N tumor board. In summary, PET showed resolving FDG avidity in the LN conglomerate in the left thoracic inlet (Deauville 3). There was no new or recurrent disease. -I discussed the case in detail with radiation oncology and radiology, who agreed  with the imaging results as noted above  -On exam, the left supraclavicular LN disease continues to improve in size.  -I reviewed imaging results in detail with the patient -Given the mild residual activity in the LN conglomerate, I have ordered one more PET in 3 months to ensure resolution of the residual activity -If the PET does not show any evidence of recurrent disease, then she can be monitored clinically -Finally, given the neck radiation, she would benefit from periodic TSH monitoring for any radiation-induced hypothyroidism   Port-A-Cath in place -As there is no plan for further treatment at this time, I have ordered port be removed  Orders Placed This Encounter  Procedures  . NM PET Image Restag (PS) Skull Base To Thigh    Standing Status:   Future    Standing Expiration Date:   11/06/2020    Scheduling Instructions:     Schedule in mid-01/2020    Order Specific Question:   ** REASON FOR EXAM (FREE TEXT)    Answer:   Diffuse large B-cell lymphoma s/p chemo and radiation, residual activity, assess resolution    Order Specific Question:   If indicated for the ordered procedure, I authorize the administration of a radiopharmaceutical per Radiology protocol    Answer:   Yes    Order Specific Question:   Preferred imaging location?    Answer:   [facility]    Order Specific Question:   Radiology Contrast Protocol - do NOT remove file path    Answer:   [email or web address]  . IR REMOVAL TUN ACCESS W/ PORT W/O FL MOD SED    Standing Status:   Future    Standing Expiration Date:  01/06/2021    Order Specific Question:   Reason for exam:    Answer:   Remove port    Order Specific Question:   Preferred Imaging Location?    Answer:   [facility]  . CBC with Differential ([facility] Only)    Standing Status:   Future    Standing Expiration Date:   12/11/2020  . CMP ([facility] only)    Standing Status:   Future    Standing Expiration  Date:   12/11/2020  . TSH    Standing Status:   Future    Standing Expiration Date:   11/06/2020   The total time spent in the encounter was 42 minutes, including face-to-face time with the patient, review of various tests results, order additional studies/medications, documentation, and coordination of care plan.   All questions were answered. The patient knows to call the clinic with any problems, questions or concerns. No barriers to learning was detected.  Return in 3 months for labs, imaging results and clinic appt with Dr. Lorenso Courier.   Tish Men, MD 3/17/202111:36 AM  CHIEF COMPLAINT: "I am doing okay "  INTERVAL HISTORY: Ms. 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  Patient reports that her appetite has been improving, and she has gained some weight.  She still has some mild numbness and tingling in the bilateral hands, but the symptoms are overall stable.  She denies any interference with ADLs.  She has had her 2 Covid vaccines.  She denies any other complaint today.  REVIEW OF SYSTEMS:   Constitutional: ( - ) fevers, ( - )  chills , ( - ) night sweats Eyes: ( - ) blurriness of vision, ( - ) double vision, ( - ) watery eyes Ears, nose, mouth, throat, and face: ( - ) mucositis, ( - ) sore throat Respiratory: ( - ) cough, ( - ) dyspnea, ( - ) wheezes Cardiovascular: ( - ) palpitation, ( - ) chest discomfort, ( - ) lower extremity swelling Gastrointestinal:  ( - ) nausea, ( - ) heartburn, ( - ) change in bowel habits Skin: ( - ) abnormal skin rashes Lymphatics: ( - ) new lymphadenopathy, ( - ) easy bruising Neurological: ( - ) numbness, ( - ) tingling, ( - ) new weaknesses Behavioral/Psych: ( - ) mood change, ( - ) new changes  All other systems were reviewed with the patient and are negative.  SUMMARY OF ONCOLOGIC HISTORY: Oncology History  DLBCL (diffuse large B cell lymphoma) ([place])  02/19/2019 Imaging   CT neck: IMPRESSION: 1.  7 cm left lower neck mass extending into the superior mediastinum most concerning for malignancy. This may reflect a conglomerate nodal mass or primary tumor of uncertain origin. 2. Additional enlarged lymph nodes adjacent to the mass in the left supraclavicular/retroclavicular and left subpectoral regions. 3.  Aortic Atherosclerosis (ICD10-I70.0).   03/12/2019 Procedure   Incisional left cervical LN bx by Dr. Constance Holster   03/12/2019 Pathology Results   Accession: [id number] Soft tissue mass, simple excision, Left Neck - DIFFUSE LARGE B-CELL LYMPHOMA - SEE COMMENT   03/19/2019 Initial Diagnosis   DLBCL (diffuse large B cell lymphoma) ([place])   03/29/2019 Imaging   PET: IMPRESSION: 1. The large left supraclavicular mass is intensely hypermetabolic compatible with history of lymphoma. There are also nearby FDG avid left supraclavicular, left axillary, and left retroperitoneal lymph nodes. 2. Hypermetabolic retroperitoneal, mesenteric and iliac adenopathy. 3. Aortic Atherosclerosis (  ICD10-I70.0). Coronary artery calcifications.   04/04/2019 -  Chemotherapy   The patient had DOXOrubicin (ADRIAMYCIN) chemo injection 80 mg, 50 mg/m2 = 80 mg, Intravenous,  Once, 6 of 6 cycles Administration: 80 mg (04/04/2019), 80 mg (04/25/2019), 80 mg (05/16/2019), 80 mg (06/06/2019), 80 mg (06/27/2019), 80 mg (07/18/2019) palonosetron (ALOXI) injection 0.25 mg, 0.25 mg, Intravenous,  Once, 6 of 6 cycles Administration: 0.25 mg (04/04/2019), 0.25 mg (04/25/2019), 0.25 mg (05/16/2019), 0.25 mg (06/06/2019), 0.25 mg (06/27/2019), 0.25 mg (07/18/2019) pegfilgrastim-jmdb (FULPHILA) injection 6 mg, 6 mg, Subcutaneous,  Once, 6 of 6 cycles Administration: 6 mg (04/06/2019), 6 mg (04/27/2019), 6 mg (05/18/2019), 6 mg (06/08/2019), 6 mg (06/29/2019), 6 mg (07/20/2019) vinCRIStine (ONCOVIN) 2 mg in sodium chloride 0.9 % 50 mL chemo infusion, 2 mg, Intravenous,  Once, 6 of 6 cycles Administration: 2 mg (04/04/2019), 2 mg (04/25/2019), 2 mg  (05/16/2019), 2 mg (06/06/2019), 2 mg (06/27/2019), 2 mg (07/18/2019) riTUXimab (RITUXAN) 600 mg in sodium chloride 0.9 % 250 mL (1.9355 mg/mL) infusion, 375 mg/m2 = 600 mg, Intravenous,  Once, 1 of 1 cycle Administration: 600 mg (04/04/2019) cyclophosphamide (CYTOXAN) 1,180 mg in sodium chloride 0.9 % 250 mL chemo infusion, 750 mg/m2 = 1,180 mg, Intravenous,  Once, 6 of 6 cycles Dose modification: 600 mg/m2 (original dose 750 mg/m2, Cycle 6, Reason: Dose not tolerated) Administration: 1,180 mg (04/04/2019), 1,180 mg (04/25/2019), 1,180 mg (05/16/2019), 1,180 mg (06/06/2019), 1,180 mg (06/27/2019), 940 mg (07/18/2019) riTUXimab (RITUXAN) 600 mg in sodium chloride 0.9 % 190 mL infusion, 375 mg/m2 = 600 mg, Intravenous,  Once, 5 of 5 cycles Administration: 600 mg (04/25/2019), 600 mg (05/16/2019), 600 mg (06/06/2019), 600 mg (06/27/2019), 600 mg (07/18/2019) fosaprepitant (EMEND) 150 mg, dexamethasone (DECADRON) 12 mg in sodium chloride 0.9 % 145 mL IVPB, , Intravenous,  Once, 6 of 6 cycles Administration:  (04/04/2019),  (04/25/2019),  (05/16/2019),  (06/06/2019),  (06/27/2019),  (07/18/2019)  for chemotherapy treatment.    06/12/2019 Imaging   PET (after 4 cycles of R-CHOP): IMPRESSION: 1. Partial treatment response. Dominant left supraclavicular nodal mass is decreased in size and metabolism, with persistent Deauville score 5 uptake. Previously visualized hypermetabolic left retropectoral, retroperitoneal and lower mesenteric lymph nodes have resolved. 2. Stable low level splenic hypermetabolism, nonspecific, possibly reactive. Normal size spleen. No splenic masses. 3. Low level hypermetabolism throughout the skeleton, compatible with mildly stimulated marrow state. 4. Asymmetric right glottic uptake without CT mass correlate, cannot exclude left focal cord paralysis. 5.  Aortic Atherosclerosis (ICD10-I70.0).   07/30/2019 Imaging   EOT PET: IMPRESSION: 1. Partial metabolic response to therapy of nodal  mass within the left side of the thoracic inlet. (Deauville) 5. 2. No new sites of disease identified. 3. Posterior right upper lobe ground-glass nodule versus area of subsegmental atelectasis. Recommend attention on follow-up. 4. 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 5. Coronary artery atherosclerosis. Aortic Atherosclerosis (ICD10-I70.0).   08/01/2019 Cancer Staging   Staging form: Hodgkin and Non-Hodgkin Lymphoma, AJCC 8th Edition - Clinical: Stage III - Signed by Tish Men, MD on 08/01/2019   11/05/2019 Imaging   PET: IMPRESSION: 1. Reduced size and activity of the nodal conglomeration at the left thoracic inlet. This currently measures as Deauville 3 activity, previously Deauville 5. No new hypermetabolic lesions are observed. 2. Other imaging findings of potential clinical significance: Aortic Atherosclerosis (ICD10-I70.0). Coronary atherosclerosis. 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 Sigmoid colon diverticulosis.     I have reviewed the past medical history, past surgical history, social history and family history with  the patient and they are unchanged from previous note.  ALLERGIES:  is allergic to codeine and sulfa antibiotics.  MEDICATIONS:  Current Outpatient Medications  Medication Sig Dispense Refill  . acetaminophen (TYLENOL) 325 MG tablet Take 650 mg by mouth every 6 (six) hours as needed for mild pain or headache.     . albuterol (VENTOLIN HFA) 108 (90 Base) MCG/ACT inhaler Inhale 2 puffs into the lungs every 6 (six) hours as needed.    [name] Kitchen allopurinol (ZYLOPRIM) 300 MG tablet Take 1 tablet (300 mg total) by mouth daily. 30 tablet 3  . celecoxib (CELEBREX) 200 MG capsule Take 200 mg by mouth every other day.    . DULoxetine (CYMBALTA) 30 MG capsule 1 CAPSULE ONCE A DAY DAILY, MAY INCREASE TO TWICE A DAY AFTER 7 DAYS IF NOT BETTER    . lidocaine-prilocaine (EMLA) cream Apply to affected area once 30 g 3  . LORazepam (ATIVAN) 0.5 MG  tablet Take 1 tablet (0.5 mg total) by mouth 2 (two) times daily as needed (for refractory nausea and vomiting). 30 tablet 0  . metoCLOPramide (REGLAN) 10 MG tablet Take 1 tablet (10 mg total) by mouth every 8 (eight) hours as needed for nausea. 40 tablet 0  . Multiple Vitamin (MULTIVITAMIN) tablet Take 1 tablet by mouth daily.    . ondansetron (ZOFRAN) 8 MG tablet Take 1 tablet (8 mg total) by mouth 2 (two) times daily as needed for refractory nausea / vomiting. Start on day 3 after cyclophosphamide chemotherapy. (Patient not taking: Reported on 11/07/2019) 30 tablet 1  . pantoprazole (PROTONIX) 20 MG tablet Take 1 tablet (20 mg total) by mouth daily. 30 tablet 5  . pegfilgrastim (NEULASTA ONPRO KIT) 6 MG/0.6ML injection Inject 6 mg into the skin as directed. Inject via provided programmed delivery device.  Every 3 weeks    . polyvinyl alcohol (LIQUIFILM TEARS) 1.4 % ophthalmic solution Place 1 drop into both eyes as needed for dry eyes (allergies).    . prochlorperazine (COMPAZINE) 10 MG tablet Take 1 tablet (10 mg total) by mouth every 6 (six) hours as needed (Nausea or vomiting). (Patient not taking: Reported on 11/07/2019) 30 tablet 6  . traMADol (ULTRAM) 50 MG tablet Take 50 mg by mouth every 6 (six) hours as needed for moderate pain.     . traZODone (DESYREL) 50 MG tablet      No current facility-administered medications for this visit.    PHYSICAL EXAMINATION: ECOG PERFORMANCE STATUS: 2 - Symptomatic, <50% confined to bed  Today's Vitals   11/07/19 1047 11/07/19 1056  BP: 124/72   Pulse: 93   Resp: 18   Temp: 98.2 F (36.8 C)   TempSrc: Oral   SpO2: 100%   Weight: 128 lb 8 oz (58.3 kg)   Height: _0  (1.473 m)   PainSc:  0-No pain   Body mass index is 26.86 kg/m.  Filed Weights   11/07/19 1047  Weight: 128 lb 8 oz (58.3 kg)    GENERAL: alert, no distress and comfortable SKIN: skin color, texture, turgor are normal, no rashes or significant lesions EYES: conjunctiva are  pink and non-injected, sclera clear OROPHARYNX: no exudate, no erythema; lips, buccal mucosa, and tongue normal  NECK: supple, non-tender LYMPH:  no palpable lymphadenopathy in the cervical LUNGS: clear to auscultation with normal breathing effort HEART: regular rate & rhythm and no murmurs and no lower extremity edema ABDOMEN: soft, non-tender, non-distended, normal bowel sounds Musculoskeletal: no cyanosis of digits and no clubbing  PSYCH: alert & oriented x 3, fluent speech  LABORATORY DATA:  I have reviewed the data as listed    Component Value Date/Time   NA 142 11/07/2019 1038   NA 137 01/16/2015 0000   K 3.7 11/07/2019 1038   CL 111 11/07/2019 1038   CO2 23 11/07/2019 1038   GLUCOSE 101 (H) 11/07/2019 1038   BUN 10 11/07/2019 1038   BUN 12 01/16/2015 0000   CREATININE 0.67 11/07/2019 1038   CALCIUM 8.8 (L) 11/07/2019 1038   PROT 6.5 11/07/2019 1038   ALBUMIN 3.5 11/07/2019 1038   AST 22 11/07/2019 1038   ALT 12 11/07/2019 1038   ALKPHOS 78 11/07/2019 1038   BILITOT 0.4 11/07/2019 1038   GFRNONAA >60 11/07/2019 1038   GFRAA >60 11/07/2019 1038    No results found for: SPEP, UPEP  Lab Results  Component Value Date   WBC 4.8 11/07/2019   NEUTROABS 2.4 11/07/2019   HGB 12.3 11/07/2019   HCT 38.3 11/07/2019   MCV 86.7 11/07/2019   PLT 266 11/07/2019      Chemistry      Component Value Date/Time   NA 142 11/07/2019 1038   NA 137 01/16/2015 0000   K 3.7 11/07/2019 1038   CL 111 11/07/2019 1038   CO2 23 11/07/2019 1038   BUN 10 11/07/2019 1038   BUN 12 01/16/2015 0000   CREATININE 0.67 11/07/2019 1038   GLU 96 01/16/2015 0000      Component Value Date/Time   CALCIUM 8.8 (L) 11/07/2019 1038   ALKPHOS 78 11/07/2019 1038   AST 22 11/07/2019 1038   ALT 12 11/07/2019 1038   BILITOT 0.4 11/07/2019 1038       RADIOGRAPHIC STUDIES: I have personally reviewed the radiological images as listed below and agreed with the findings in the report. NM PET Image  Restag (PS) Skull Base To Thigh  Result Date: 11/05/2019 CLINICAL DATA:  Subsequent treatment strategy for diffuse B-cell lymphoma. Completed chemotherapy and ISRT. EXAM: NUCLEAR MEDICINE PET SKULL BASE TO THIGH TECHNIQUE: 6.2 mCi F-18 FDG was injected intravenously. Full-ring PET imaging was performed from the skull base to thigh after the radiotracer. CT data was obtained and used for attenuation correction and anatomic localization. Fasting blood glucose: 94 mg/dl COMPARISON:  Multiple exams, including 07/30/2019 FINDINGS: Mediastinal blood pool activity: SUV max 2.1 Liver activity: SUV max 3.4 NECK: Asymmetric right vocal cord activity, with prominence of the left piriformis, appearance suspicious for physiologic activity and right cord with paralysis of the left cord. The right vocal cord has maximum SUV of 11.9, previously 16.7. Incidental CT findings: none CHEST: Asymmetric soft tissue density at the left thoracic inlet measuring 2.7 by 2.6 cm on image 44/4, previously 3.3 by 2.8 cm by my measurements. This mass currently has a maximum SUV of 3.0, Deauville 3, and previously had a maximum SUV of 11.9 (previously Deauville 5). No new thoracic hypermetabolic lesion. Incidental CT findings: Right Port-A-Cath tip: Cavoatrial junction. Coronary, aortic arch, and branch vessel atherosclerotic vascular disease. Mild cardiomegaly. The ground-glass density posteriorly seen in the right upper lobe on the prior exam is no longer visualized. ABDOMEN/PELVIS: No significant abnormal hypermetabolic activity in this region. Incidental CT findings: Sigmoid colon diverticulosis. Aortoiliac atherosclerotic vascular disease. SKELETON: The previous diffuse hypermetabolism of the skeleton has resolved. No focal skeletal hypermetabolic activity is observed. Incidental CT findings: Grade 1 anterolisthesis at L4-5 and L5-S1. Sternoclavicular arthropathy bilaterally. IMPRESSION: 1. Reduced size and activity of the nodal  conglomeration at  the left thoracic inlet. This currently measures as Deauville 3 activity, previously Deauville 5. No new hypermetabolic lesions are observed. 2. Other imaging findings of potential clinical significance: Aortic Atherosclerosis (ICD10-I70.0). Coronary atherosclerosis. 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 Sigmoid colon diverticulosis. Electronically Signed   By: Van Clines M.D.   On: 11/05/2019 14:10

## 2019-11-07 NOTE — Telephone Encounter (Signed)
Scheduled per los. Gave avs and calendar  

## 2019-11-18 ENCOUNTER — Other Ambulatory Visit: Payer: Self-pay | Admitting: Radiology

## 2019-11-20 ENCOUNTER — Encounter (HOSPITAL_COMMUNITY): Payer: Self-pay

## 2019-11-20 ENCOUNTER — Other Ambulatory Visit: Payer: Self-pay

## 2019-11-20 ENCOUNTER — Ambulatory Visit (HOSPITAL_COMMUNITY)
Admission: RE | Admit: 2019-11-20 | Discharge: 2019-11-20 | Disposition: A | Discharge status: Home / Self Care | Payer: PPO | Source: Ambulatory Visit | Attending: Hematology | Admitting: Hematology

## 2019-11-20 DIAGNOSIS — E7800 Pure hypercholesterolemia, unspecified: Secondary | ICD-10-CM | POA: Diagnosis not present

## 2019-11-20 DIAGNOSIS — C8331 Diffuse large B-cell lymphoma, lymph nodes of head, face, and neck: Secondary | ICD-10-CM

## 2019-11-20 DIAGNOSIS — Z885 Allergy status to narcotic agent status: Secondary | ICD-10-CM | POA: Insufficient documentation

## 2019-11-20 DIAGNOSIS — Z9221 Personal history of antineoplastic chemotherapy: Secondary | ICD-10-CM | POA: Diagnosis not present

## 2019-11-20 DIAGNOSIS — Z452 Encounter for adjustment and management of vascular access device: Secondary | ICD-10-CM | POA: Diagnosis not present

## 2019-11-20 DIAGNOSIS — Z882 Allergy status to sulfonamides status: Secondary | ICD-10-CM | POA: Diagnosis not present

## 2019-11-20 DIAGNOSIS — Z79899 Other long term (current) drug therapy: Secondary | ICD-10-CM | POA: Diagnosis not present

## 2019-11-20 DIAGNOSIS — Z791 Long term (current) use of non-steroidal anti-inflammatories (NSAID): Secondary | ICD-10-CM | POA: Insufficient documentation

## 2019-11-20 DIAGNOSIS — Z923 Personal history of irradiation: Secondary | ICD-10-CM | POA: Diagnosis not present

## 2019-11-20 HISTORY — DX: Dyspnea, unspecified: R06.00

## 2019-11-20 HISTORY — PX: IR REMOVAL TUN ACCESS W/ PORT W/O FL MOD SED: IMG2290

## 2019-11-20 LAB — CBC WITH DIFFERENTIAL/PLATELET
Abs Immature Granulocytes: 0.01 10*3/uL (ref 0.00–0.07)
Basophils Absolute: 0 10*3/uL (ref 0.0–0.1)
Basophils Relative: 0 %
Eosinophils Absolute: 0.3 10*3/uL (ref 0.0–0.5)
Eosinophils Relative: 7 %
HCT: 41.6 % (ref 36.0–46.0)
Hemoglobin: 13.1 g/dL (ref 12.0–15.0)
Immature Granulocytes: 0 %
Lymphocytes Relative: 34 %
Lymphs Abs: 1.8 10*3/uL (ref 0.7–4.0)
MCH: 27.7 pg (ref 26.0–34.0)
MCHC: 31.5 g/dL (ref 30.0–36.0)
MCV: 87.9 fL (ref 80.0–100.0)
Monocytes Absolute: 0.8 10*3/uL (ref 0.1–1.0)
Monocytes Relative: 16 %
Neutro Abs: 2.2 10*3/uL (ref 1.7–7.7)
Neutrophils Relative %: 43 %
Platelets: 290 10*3/uL (ref 150–400)
RBC: 4.73 MIL/uL (ref 3.87–5.11)
RDW: 14.6 % (ref 11.5–15.5)
WBC: 5.2 10*3/uL (ref 4.0–10.5)
nRBC: 0 % (ref 0.0–0.2)

## 2019-11-20 MED ORDER — CEFAZOLIN SODIUM-DEXTROSE 2-4 GM/100ML-% IV SOLN: 2.0000 g | INTRAVENOUS | Status: AC

## 2019-11-20 MED ORDER — MIDAZOLAM HCL 2 MG/2ML IJ SOLN
INTRAMUSCULAR | Status: AC
  Filled 2019-11-20: qty 2

## 2019-11-20 MED ORDER — LIDOCAINE HCL 1 % IJ SOLN
INTRAMUSCULAR | Status: AC
  Filled 2019-11-20: qty 20

## 2019-11-20 MED ORDER — LIDOCAINE HCL (PF) 1 % IJ SOLN
INTRAMUSCULAR | Status: AC | PRN
  Administered 2019-11-20: 10 mL via INTRADERMAL

## 2019-11-20 MED ORDER — FENTANYL CITRATE (PF) 100 MCG/2ML IJ SOLN
INTRAMUSCULAR | Status: AC | PRN
  Administered 2019-11-20 (×2): 50 ug via INTRAVENOUS

## 2019-11-20 MED ORDER — MIDAZOLAM HCL 2 MG/2ML IJ SOLN
INTRAMUSCULAR | Status: AC | PRN
  Administered 2019-11-20 (×2): 1 mg via INTRAVENOUS
  Administered 2019-11-20 (×2): 0.5 mg via INTRAVENOUS

## 2019-11-20 MED ORDER — FENTANYL CITRATE (PF) 100 MCG/2ML IJ SOLN
INTRAMUSCULAR | Status: AC
  Filled 2019-11-20: qty 2

## 2019-11-20 MED ORDER — SODIUM CHLORIDE 0.9 % IV SOLN: INTRAVENOUS | Status: DC

## 2019-11-20 MED ORDER — CEFAZOLIN SODIUM-DEXTROSE 2-4 GM/100ML-% IV SOLN
INTRAVENOUS | Status: AC
  Administered 2019-11-20: 2 g via INTRAVENOUS
  Filled 2019-11-20: qty 100

## 2019-11-20 MED ORDER — TRAMADOL HCL 50 MG PO TABS
50.0000 mg | ORAL_TABLET | ORAL | Status: AC
  Administered 2019-11-20: 50 mg via ORAL
  Filled 2019-11-20: qty 1

## 2019-11-20 NOTE — Procedures (Addendum)
Interventional Radiology Procedure Note  Procedure: Port removal  Complications: None  Estimated Blood Loss: 50 mL  Findings: Right chest port removed in entirety. Bleeding superficial artery encountered which was tied off with 3-0 Vicryl suture. Port pocket closed.  Venetia Night. Kathlene Cote, M.D Pager:  [phone]

## 2019-11-20 NOTE — H&P (Signed)
Referring Physician(s): Zhao,Yan  Supervising Physician: Aletta Edouard  Patient Status:  [facility]  Chief Complaint:  "I'm getting my port out"   Subjective: Patient familiar to IR service from Port-A-Cath placement on 03/28/2019.  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  She has no new or recurrent disease on recent PET scan.  She is no longer using her Port-A-Cath and presents today for Port-A-Cath removal.  She currently denies fever, headache, chest pain, dyspnea, cough, abdominal/back pain, nausea, vomiting or bleeding.  Past Medical History:  Diagnosis Date  . Anemia   . Arthralgia   . Arthritis   . Elevated cholesterol   . History of radiation therapy 08/20/19- 09/24/19   lower left neck 25 fractions of 1.8 Gy to total 45 Gy.   . Insomnia   . Lymphoma ([place])   . Neck mass    left   Past Surgical History:  Procedure Laterality Date  . ABDOMINAL HYSTERECTOMY     partial  . IR IMAGING GUIDED PORT INSERTION  03/28/2019  . MASS BIOPSY Left 03/12/2019   Procedure: NECK MASS BIOPSY;  Surgeon: Izora Gala, MD;  Location: [facility];  Service: ENT;  Laterality: Left;  . TOTAL KNEE ARTHROPLASTY Right 01/06/2015   Procedure: RIGHT TOTAL KNEE ARTHROPLASTY;  Surgeon: Frederik Pear, MD;  Location: [facility];  Service: Orthopedics;  Laterality: Right;      Allergies: Codeine and Sulfa antibiotics  Medications: Prior to Admission medications   Medication Sig Start Date End Date Taking? Authorizing Provider  acetaminophen (TYLENOL) 325 MG tablet Take 650 mg by mouth every 6 (six) hours as needed for mild pain or headache.     [provider]  albuterol (VENTOLIN HFA) 108 (90 Base) MCG/ACT inhaler Inhale 2 puffs into the lungs every 6 (six) hours as needed. 06/29/19   [provider]  allopurinol (ZYLOPRIM) 300 MG tablet Take 1 tablet (300 mg total) by mouth daily. 03/28/19   Tish Men, MD  celecoxib (CELEBREX)  200 MG capsule Take 200 mg by mouth every other day.    [provider]  DULoxetine (CYMBALTA) 30 MG capsule 1 CAPSULE ONCE A DAY DAILY, MAY INCREASE TO TWICE A DAY AFTER 7 DAYS IF NOT BETTER 06/15/19   [provider]  lidocaine-prilocaine (EMLA) cream Apply to affected area once 03/28/19   Tish Men, MD  LORazepam (ATIVAN) 0.5 MG tablet Take 1 tablet (0.5 mg total) by mouth 2 (two) times daily as needed (for refractory nausea and vomiting). 05/16/19   Tish Men, MD  metoCLOPramide (REGLAN) 10 MG tablet Take 1 tablet (10 mg total) by mouth every 8 (eight) hours as needed for nausea. 07/18/19   Tish Men, MD  Multiple Vitamin (MULTIVITAMIN) tablet Take 1 tablet by mouth daily.    [provider]  ondansetron (ZOFRAN) 8 MG tablet Take 1 tablet (8 mg total) by mouth 2 (two) times daily as needed for refractory nausea / vomiting. Start on day 3 after cyclophosphamide chemotherapy. Patient not taking: Reported on 11/07/2019 03/28/19   Tish Men, MD  pantoprazole (PROTONIX) 20 MG tablet Take 1 tablet (20 mg total) by mouth daily. 06/27/19 07/27/19  Tish Men, MD  pegfilgrastim (NEULASTA ONPRO KIT) 6 MG/0.6ML injection Inject 6 mg into the skin as directed. Inject via provided programmed delivery device.  Every 3 weeks    [provider]  polyvinyl alcohol (LIQUIFILM TEARS) 1.4 % ophthalmic solution Place 1 drop into both  eyes as needed for dry eyes (allergies).    [provider]  prochlorperazine (COMPAZINE) 10 MG tablet Take 1 tablet (10 mg total) by mouth every 6 (six) hours as needed (Nausea or vomiting). Patient not taking: Reported on 11/07/2019 03/28/19   Tish Men, MD  traMADol (ULTRAM) 50 MG tablet Take 50 mg by mouth every 6 (six) hours as needed for moderate pain.     [provider]  traZODone (DESYREL) 50 MG tablet  10/01/19   [provider]     Vital Signs:pend   Physical Exam awake, alert.  Chest clear to auscultation bilaterally.   Clean, intact right chest wall Port-A-Cath.  Heart with regular rate and rhythm.  Abdomen soft, positive bowel sounds, nontender.  No lower extremity edema.  Imaging: No results found.  Labs:  CBC: Recent Labs    08/01/19 0925 09/05/19 1340 10/03/19 1442 11/07/19 1038  WBC 7.5 5.8 5.2 4.8  HGB 8.5* 11.6* 11.8* 12.3  HCT 26.4* 36.2 36.7 38.3  PLT 117* 237 238 266    COAGS: Recent Labs    03/28/19 1111  INR 0.9    BMP: Recent Labs    08/01/19 0925 09/05/19 1340 10/03/19 1442 11/07/19 1038  NA 140 139 142 142  K 3.4* 4.0 3.9 3.7  CL 106 105 107 111  CO2 '26 24 26 23  ' GLUCOSE 102* 81 87 101*  BUN 6* '13 13 10  ' CALCIUM 8.5* 9.4 9.0 8.8*  CREATININE 0.67 0.64 0.66 0.67  GFRNONAA >60 >60 >60 >60  GFRAA >60 >60 >60 >60    LIVER FUNCTION TESTS: Recent Labs    08/01/19 0925 09/05/19 1340 10/03/19 1442 11/07/19 1038  BILITOT <0.2* 0.4 0.2* 0.4  AST '16 25 17 22  ' ALT '12 26 16 12  ' ALKPHOS 69 70 79 78  PROT 6.0* 6.8 6.6 6.5  ALBUMIN 3.4* 3.9 3.7 3.5    Assessment and Plan: Pt with history of diffuse large B-cell lymphoma, s/p completion of chemoradiation.  She has no new or recurrent disease on recent PET scan.  She is no longer using her Port-A-Cath and presents today for Port-A-Cath removal.  Details/risks of procedure, including but not limited to, internal bleeding, infection, injury to adjacent structures discussed with patient with her understanding and consent.  Electronically Signed: D. Rowe Robert, PA-C 11/20/2019, 1:07 PM    I spent a total of 20 minutes at the the patient's bedside AND on the patient's hospital floor or unit, greater than 50% of which was counseling/coordinating care for Port-A-Cath removal

## 2019-11-20 NOTE — Progress Notes (Signed)
Patient complains of more pain at site where port was removed. Ice pack applied. Called Dr Kathlene Cote to get order for tramadol 50 mg which patient takes at home.

## 2019-11-20 NOTE — Discharge Instructions (Signed)
Implanted Port Removal, Care After °This sheet gives you information about how to care for yourself after your procedure. Your health care provider may also give you more specific instructions. If you have problems or questions, contact your health care provider. °What can I expect after the procedure? °After the procedure, it is common to have: °· Soreness or pain near your incision. °· Some swelling or bruising near your incision. °Follow these instructions at home: °Medicines °· Take over-the-counter and prescription medicines only as told by your health care provider. °· If you were prescribed an antibiotic medicine, take it as told by your health care provider. Do not stop taking the antibiotic even if you start to feel better. °Bathing °· Do not take baths, swim, or use a hot tub until your health care provider approves. Ask your health care provider if you can take showers. You may only be allowed to take sponge baths. °Incision care ° °· Follow instructions from your health care provider about how to take care of your incision. Make sure you: °? Wash your hands with soap and water before you change your bandage (dressing). If soap and water are not available, use hand sanitizer. °? Change your dressing as told by your health care provider. °? Keep your dressing dry. °? Leave stitches (sutures), skin glue, or adhesive strips in place. These skin closures may need to stay in place for 2 weeks or longer. If adhesive strip edges start to loosen and curl up, you may trim the loose edges. Do not remove adhesive strips completely unless your health care provider tells you to do that. °· Check your incision area every day for signs of infection. Check for: °? More redness, swelling, or pain. °? More fluid or blood. °? Warmth. °? Pus or a bad smell. °Driving ° °· Do not drive for 24 hours if you were given a medicine to help you relax (sedative) during your procedure. °· If you did not receive a sedative, ask your  health care provider when it is safe to drive. °Activity °· Return to your normal activities as told by your health care provider. Ask your health care provider what activities are safe for you. °· Do not lift anything that is heavier than 10 lb (4.5 kg), or the limit that you are told, until your health care provider says that it is safe. °· Do not do activities that involve lifting your arms over your head. °General instructions °· Do not use any products that contain nicotine or tobacco, such as cigarettes and e-cigarettes. These can delay healing. If you need help quitting, ask your health care provider. °· Keep all follow-up visits as told by your health care provider. This is important. °Contact a health care provider if: °· You have more redness, swelling, or pain around your incision. °· You have more fluid or blood coming from your incision. °· Your incision feels warm to the touch. °· You have pus or a bad smell coming from your incision. °· You have pain that is not relieved by your pain medicine. °Get help right away if you have: °· A fever or chills. °· Chest pain. °· Difficulty breathing. °Summary °· After the procedure, it is common to have pain, soreness, swelling, or bruising near your incision. °· If you were prescribed an antibiotic medicine, take it as told by your health care provider. Do not stop taking the antibiotic even if you start to feel better. °· Do not drive for 24 hours   if you were given a sedative during your procedure. °· Return to your normal activities as told by your health care provider. Ask your health care provider what activities are safe for you. °This information is not intended to replace advice given to you by your health care provider. Make sure you discuss any questions you have with your health care provider. °Document Revised: 09/22/2017 Document Reviewed: 09/22/2017 °Elsevier Patient Education © 2020 [employer] ° ° ° °Moderate Conscious Sedation, Adult, Care  After °These instructions provide you with information about caring for yourself after your procedure. Your health care provider may also give you more specific instructions. Your treatment has been planned according to current medical practices, but problems sometimes occur. Call your health care provider if you have any problems or questions after your procedure. °What can I expect after the procedure? °After your procedure, it is common: °· To feel sleepy for several hours. °· To feel clumsy and have poor balance for several hours. °· To have poor judgment for several hours. °· To vomit if you eat too soon. °Follow these instructions at home: °For at least 24 hours after the procedure: ° °· Do not: °? Participate in activities where you could fall or become injured. °? Drive. °? Use heavy machinery. °? Drink alcohol. °? Take sleeping pills or medicines that cause drowsiness. °? Make important decisions or sign legal documents. °? Take care of children on your own. °· Rest. °Eating and drinking °· Follow the diet recommended by your health care provider. °· If you vomit: °? Drink water, juice, or soup when you can drink without vomiting. °? Make sure you have little or no nausea before eating solid foods. °General instructions °· Have a responsible adult stay with you until you are awake and alert. °· Take over-the-counter and prescription medicines only as told by your health care provider. °· If you smoke, do not smoke without supervision. °· Keep all follow-up visits as told by your health care provider. This is important. °Contact a health care provider if: °· You keep feeling nauseous or you keep vomiting. °· You feel light-headed. °· You develop a rash. °· You have a fever. °Get help right away if: °· You have trouble breathing. °This information is not intended to replace advice given to you by your health care provider. Make sure you discuss any questions you have with your health care provider. °Document  Revised: 07/22/2017 Document Reviewed: 11/29/2015 °Elsevier Patient Education © 2020 Elsevier Inc. ° °

## 2019-12-11 DIAGNOSIS — J309 Allergic rhinitis, unspecified: Secondary | ICD-10-CM | POA: Diagnosis not present

## 2019-12-11 DIAGNOSIS — C8330 Diffuse large B-cell lymphoma, unspecified site: Secondary | ICD-10-CM | POA: Diagnosis not present

## 2019-12-11 DIAGNOSIS — J45901 Unspecified asthma with (acute) exacerbation: Secondary | ICD-10-CM | POA: Diagnosis not present

## 2020-01-08 ENCOUNTER — Ambulatory Visit (INDEPENDENT_AMBULATORY_CARE_PROVIDER_SITE_OTHER): Payer: PPO | Admitting: Podiatry

## 2020-01-08 ENCOUNTER — Other Ambulatory Visit: Payer: Self-pay

## 2020-01-08 VITALS — Temp 98.0°F

## 2020-01-08 DIAGNOSIS — M79675 Pain in left toe(s): Secondary | ICD-10-CM | POA: Diagnosis not present

## 2020-01-08 DIAGNOSIS — B351 Tinea unguium: Secondary | ICD-10-CM

## 2020-01-08 DIAGNOSIS — M79674 Pain in right toe(s): Secondary | ICD-10-CM | POA: Diagnosis not present

## 2020-01-08 MED ORDER — CICLOPIROX 8 % EX SOLN: Freq: Every day | CUTANEOUS | 2 refills | Status: DC

## 2020-01-08 NOTE — Patient Instructions (Signed)
Ciclopirox nail solution What is this medicine? CICLOPIROX (sye kloe PEER ox) NAIL SOLUTION is an antifungal medicine. It used to treat fungal infections of the nails. This medicine may be used for other purposes; ask your health care provider or pharmacist if you have questions. COMMON BRAND NAME(S): [id number], Penlac What should I tell my health care provider before I take this medicine? They need to know if you have any of these conditions:  diabetes mellitus  history of seizures  HIV infection  immune system problems or organ transplant  large areas of burned or damaged skin  peripheral vascular disease or poor circulation  taking corticosteroid medication (including steroid inhalers, cream, or lotion)  an unusual or allergic reaction to ciclopirox, isopropyl alcohol, other medicines, foods, dyes, or preservatives  pregnant or trying to get pregnant  breast-feeding How should I use this medicine? This medicine is for external use only. Follow the directions that come with this medicine exactly. Wash and dry your hands before use. Avoid contact with the eyes, mouth or nose. If you do get this medicine in your eyes, rinse out with plenty of cool tap water. Contact your doctor or health care professional if eye irritation occurs. Use at regular intervals. Do not use your medicine more often than directed. Finish the full course prescribed by your doctor or health care professional even if you think you are better. Do not stop using except on your doctor's advice. Talk to your pediatrician regarding the use of this medicine in children. While this medicine may be prescribed for children as young as 12 years for selected conditions, precautions do apply. Overdosage: If you think you have taken too much of this medicine contact a poison control center or emergency room at once. NOTE: This medicine is only for you. Do not share this medicine with others. What if I miss a dose? If you miss a  dose, use it as soon as you can. If it is almost time for your next dose, use only that dose. Do not use double or extra doses. What may interact with this medicine? Interactions are not expected. Do not use any other skin products without telling your doctor or health care professional. This list may not describe all possible interactions. Give your health care provider a list of all the medicines, herbs, non-prescription drugs, or dietary supplements you use. Also tell them if you smoke, drink alcohol, or use illegal drugs. Some items may interact with your medicine. What should I watch for while using this medicine? Tell your doctor or health care professional if your symptoms get worse. Four to six months of treatment may be needed for the nail(s) to improve. Some people may not achieve a complete cure or clearing of the nails by this time. Tell your doctor or health care professional if you develop sores or blisters that do not heal properly. If your nail infection returns after stopping using this product, contact your doctor or health care professional. What side effects may I notice from receiving this medicine? Side effects that you should report to your doctor or health care professional as soon as possible:  allergic reactions like skin rash, itching or hives, swelling of the face, lips, or tongue  severe irritation, redness, burning, blistering, peeling, swelling, oozing Side effects that usually do not require medical attention (report to your doctor or health care professional if they continue or are bothersome):  mild reddening of the skin  nail discoloration  temporary burning or mild   stinging at the site of application This list may not describe all possible side effects. Call your doctor for medical advice about side effects. You may report side effects to FDA at 1-800-FDA-1088. Where should I keep my medicine? Keep out of the reach of children. Store at room temperature  between 15 and 30 degrees C (59 and 86 degrees F). Do not freeze. Protect from light by storing the bottle in the carton after every use. This medicine is flammable. Keep away from heat and flame. Throw away any unused medicine after the expiration date. NOTE: This sheet is a summary. It may not cover all possible information. If you have questions about this medicine, talk to your doctor, pharmacist, or health care provider.  2020 Elsevier/Gold Standard (2007-11-13 16:49:20)  

## 2020-01-15 DIAGNOSIS — J309 Allergic rhinitis, unspecified: Secondary | ICD-10-CM | POA: Diagnosis not present

## 2020-01-15 NOTE — Progress Notes (Signed)
Subjective: 79 y.o. returns the office today for painful, elongated, thickened toenails which she cannot trim herself. Denies any redness or drainage around the nails. She is a nail in the right side causes some occasional malodor but denies any drainage or pus. Denies any acute changes since last appointment and no new complaints today. Denies any systemic complaints such as fevers, chills, nausea, vomiting.   PCP: Jonathon Jordan, MD  Objective: AAO 3, NAD DP/PT pulses palpable, CRT less than 3 seconds Nails hypertrophic, dystrophic, elongated, brittle, discolored 10. There is tenderness overlying the nails 1-5 bilaterally. There is no surrounding erythema or drainage along the nail sites. I do not appreciate any malodor from the nails today. No open lesions or pre-ulcerative lesions are identified. No other areas of tenderness bilateral lower extremities. No overlying edema, erythema, increased warmth. No pain with calf compression, swelling, warmth, erythema.  Assessment: Patient presents with symptomatic onychomycosis  Plan: -Treatment options including alternatives, risks, complications were discussed -Nails sharply debrided 10 without complication/bleeding. Prescribed Penlac -Discussed daily foot inspection. If there are any changes, to call the office immediately.  -Follow-up in 3 months or sooner if any problems are to arise. In the meantime, encouraged to call the office with any questions, concerns, changes symptoms.  Celesta Gentile, DPM

## 2020-02-12 ENCOUNTER — Other Ambulatory Visit: Payer: Self-pay

## 2020-02-12 ENCOUNTER — Ambulatory Visit (HOSPITAL_COMMUNITY)
Admission: RE | Admit: 2020-02-12 | Discharge: 2020-02-12 | Disposition: A | Discharge status: Home / Self Care | Payer: PPO | Source: Ambulatory Visit | Attending: Hematology | Admitting: Hematology

## 2020-02-12 ENCOUNTER — Other Ambulatory Visit: Payer: Self-pay | Admitting: Hematology and Oncology

## 2020-02-12 DIAGNOSIS — I2510 Atherosclerotic heart disease of native coronary artery without angina pectoris: Secondary | ICD-10-CM | POA: Insufficient documentation

## 2020-02-12 DIAGNOSIS — C8331 Diffuse large B-cell lymphoma, lymph nodes of head, face, and neck: Secondary | ICD-10-CM

## 2020-02-12 DIAGNOSIS — I700 Atherosclerosis of aorta: Secondary | ICD-10-CM | POA: Insufficient documentation

## 2020-02-12 DIAGNOSIS — C8512 Unspecified B-cell lymphoma, intrathoracic lymph nodes: Secondary | ICD-10-CM | POA: Diagnosis not present

## 2020-02-12 LAB — GLUCOSE, CAPILLARY: Glucose-Capillary: 101 mg/dL — ABNORMAL HIGH (ref 70–99)

## 2020-02-12 MED ORDER — FLUDEOXYGLUCOSE F - 18 (FDG) INJECTION
7.1000 | Freq: Once | INTRAVENOUS | Status: AC
  Administered 2020-02-12: 7.1 via INTRAVENOUS

## 2020-02-12 NOTE — Progress Notes (Signed)
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 BCL2 rearrangement positive, no BCL6 or Myc rearrangement -03/2019: left supraclavicular (bulky), left axillary, RP, mesenteric and iliac adenopathy on PET; bone marrow bx negative for lymphoma  -Mid-03/2019 - late 06/2019: R-CHOP with G-CSF support  ? EOT PET showed decreased but residual left thoracic inlet LN disease (Deauville 5); resolution of other sites of disease  -Late 07/2019 - 09/2019: ISRT, 45 Gy/25 fractions  ? Interim PET showed resolving FDG avidity in the LN's in the thoracic inlet (Deauville 3); no new disease  ? 02/12/2020: repeat PET CT shows residual FDG avidity in the LN's in the thoracic inlet (Deauville 3); no new disease  2. Port placed in 03/2019  TREATMENT REGIMEN:  04/04/2019 - 07/20/2019: R-CHOP with Udenyca x 6 cycles  08/20/2019 - 09/24/2019: ISRT, 45 Gy/25 fractions; completed on 09/24/2019   Interval History:  Sandra Payne 79 y.o. 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 The patient's last visit was on 11/07/2019 with Dr. Maylon Peppers. In the interim since the last visit she has had a PET CT scan on 02/12/2020   which showed decrease in size of soft tissue mass at the LEFT thoracic inlet with slightly diminished FDG uptake, Deauville category 3.  On exam today Mrs. Nifong notes that she feels well.  She notes that she had an increase in energy and also an increase in her appetite.  She notes that she continues to have some hoarseness though this is gradually improved.  She notes also that her balance is little bit off and she has to be careful when standing up from the sitting position.  She notes that she attempted to "keep moving" and has been doing gardening.  Overall she reports that she has been quite well and has no concerns or complaints today.  A full 10 point ROS is listed below.  MEDICAL HISTORY:  Past Medical History:  Diagnosis Date  . Anemia   . Arthralgia   . Arthritis   . Dyspnea    increased exertion  . Elevated cholesterol   . History of radiation therapy 08/20/19- 09/24/19   lower left neck 25 fractions of 1.8 Gy to total 45 Gy.   . Insomnia   . Lymphoma ([place])   . Neck mass    left    SURGICAL HISTORY: Past Surgical History:  Procedure Laterality Date  . ABDOMINAL HYSTERECTOMY     partial  . IR IMAGING GUIDED PORT INSERTION  03/28/2019  . IR REMOVAL TUN ACCESS W/ PORT W/O FL MOD SED  11/20/2019  . MASS BIOPSY Left 03/12/2019   Procedure: NECK MASS BIOPSY;  Surgeon: Izora Gala, MD;  Location: [place];  Service: ENT;  Laterality: Left;  . TOTAL KNEE ARTHROPLASTY Right 01/06/2015   Procedure: RIGHT TOTAL  KNEE ARTHROPLASTY;  Surgeon: Frederik Pear, MD;  Location: [place];  Service: Orthopedics;  Laterality: Right;    SOCIAL HISTORY: Social History   Socioeconomic History  . Marital status: Widowed    Spouse name: Not on file  . Number of children: 1  . Years of education: Not on file  . Highest education level: Not on file  Occupational History  . Not on file  Tobacco Use  . Smoking status: Former Smoker    Packs/day: 0.25    Years: 2.00    Pack  years: 0.50    Types: Cigarettes  . Smokeless tobacco: Never Used  . Tobacco comment: she quit 30-40 years ago.   Vaping Use  . Vaping Use: Never used  Substance and Sexual Activity  . Alcohol use: Yes    Alcohol/week: 0.0 standard drinks    Comment: occasionally  . Drug use: No  . Sexual activity: Not on file  Other Topics Concern  . Not on file  Social History Narrative   Lives alone in a 2 story home.  Husband passed away on August 25, 2014.     Works one day a week at [employer].     Retired from [employer].  Exercises regularly.    Patient is right-handed.   Social Determinants of Health   Financial Resource Strain:   . Difficulty of Paying Living Expenses:   Food Insecurity:   . Worried About Charity fundraiser in the Last Year:   . Arboriculturist in the Last Year:   Transportation Needs: No Transportation Needs  . Lack of Transportation (Medical): No  . Lack of Transportation (Non-Medical): No  Physical Activity:   . Days of Exercise per Week:   . Minutes of Exercise per Session:   Stress:   . Feeling of Stress :   Social Connections:   . Frequency of Communication with Friends and Family:   . Frequency of Social Gatherings with Friends and Family:   . Attends Religious Services:   . Active Member of Clubs or Organizations:   . Attends Archivist Meetings:   [name] Kitchen Marital Status:   Intimate Partner Violence: Not At Risk  . Fear of Current or Ex-Partner: No  . Emotionally Abused: No  . Physically Abused: No  . Sexually Abused: No    FAMILY HISTORY: Family History  Problem Relation Age of Onset  . Diabetes Mother   . Hypertension Mother   . Breast cancer Neg Hx     ALLERGIES:  is allergic to codeine and sulfa antibiotics.  MEDICATIONS:  Current Outpatient Medications  Medication Sig Dispense Refill  . acetaminophen (TYLENOL) 325 MG tablet Take 650 mg by mouth every 6 (six) hours as needed for mild pain or  headache.     . albuterol (VENTOLIN HFA) 108 (90 Base) MCG/ACT inhaler Inhale 2 puffs into the lungs every 6 (six) hours as needed.    . celecoxib (CELEBREX) 200 MG capsule Take 200 mg by mouth every other day.    . ciclopirox (PENLAC) 8 % solution Apply topically at bedtime. Apply over nail and surrounding skin. Apply daily over previous coat. After seven (7) days, may remove with alcohol and continue cycle. 6.6 mL 2  . Multiple Vitamin (MULTIVITAMIN) tablet Take 1 tablet by mouth daily.    . pantoprazole (PROTONIX) 20 MG tablet Take 1 tablet (20 mg total) by mouth daily. 90 tablet 3  . traZODone (DESYREL) 50 MG  tablet Take 1 tablet (50 mg total) by mouth at bedtime. 90 tablet 1   No current facility-administered medications for this visit.    REVIEW OF SYSTEMS:   Constitutional: ( - ) fevers, ( - )  chills , ( - ) night sweats Eyes: ( - ) blurriness of vision, ( - ) double vision, ( - ) watery eyes Ears, nose, mouth, throat, and face: ( - ) mucositis, ( - ) sore throat Respiratory: ( - ) cough, ( - ) dyspnea, ( - ) wheezes Cardiovascular: ( - ) palpitation, ( - ) chest discomfort, ( - ) lower extremity swelling Gastrointestinal:  ( - ) nausea, ( - ) heartburn, ( - ) change in bowel habits Skin: ( - ) abnormal skin rashes Lymphatics: ( - ) new lymphadenopathy, ( - ) easy bruising Neurological: ( - ) numbness, ( - ) tingling, ( - ) new weaknesses Behavioral/Psych: ( - ) mood change, ( - ) new changes  All other systems were reviewed with the patient and are negative.  PHYSICAL EXAMINATION: ECOG PERFORMANCE STATUS: 1 - Symptomatic but completely ambulatory  Vitals:   02/13/20 1101  BP: 123/75  Pulse: 83  Resp: 17  Temp: 97.8 F (36.6 C)  SpO2: 100%   Filed Weights   02/13/20 1101  Weight: 132 lb 14.4 oz (60.3 kg)    GENERAL: alert, no distress and comfortable SKIN: skin color, texture, turgor are normal, no rashes or significant lesions EYES: conjunctiva are pink and  non-injected, sclera clear NECK: supple, non-tender LYMPH:  no palpable lymphadenopathy in the cervical, axillary or supraclavicular lymph nodes.  LUNGS: clear to auscultation and percussion with normal breathing effort HEART: regular rate & rhythm and no murmurs and no lower extremity edema Musculoskeletal: no cyanosis of digits and no clubbing  PSYCH: alert & oriented x 3, fluent speech NEURO: no focal motor/sensory deficits  LABORATORY DATA:  I have reviewed the data as listed CBC Latest Ref Rng & Units 02/13/2020 11/20/2019 11/07/2019  WBC 4.0 - 10.5 K/uL 5.2 5.2 4.8  Hemoglobin 12.0 - 15.0 g/dL 12.9 13.1 12.3  Hematocrit 36 - 46 % 40.2 41.6 38.3  Platelets 150 - 400 K/uL 307 290 266    CMP Latest Ref Rng & Units 02/13/2020 11/07/2019 10/03/2019  Glucose 70 - 99 mg/dL 97 101(H) 87  BUN 8 - 23 mg/dL '11 10 13  ' Creatinine 0.44 - 1.00 mg/dL 0.72 0.67 0.66  Sodium 135 - 145 mmol/L 140 142 142  Potassium 3.5 - 5.1 mmol/L 4.1 3.7 3.9  Chloride 98 - 111 mmol/L 108 111 107  CO2 22 - 32 mmol/L '25 23 26  ' Calcium 8.9 - 10.3 mg/dL 9.2 8.8(L) 9.0  Total Protein 6.5 - 8.1 g/dL 6.7 6.5 6.6  Total Bilirubin 0.3 - 1.2 mg/dL 0.5 0.4 0.2(L)  Alkaline Phos 38 - 126 U/L 90 78 79  AST 15 - 41 U/L '19 22 17  ' ALT 0 - 44 U/L '19 12 16    ' RADIOGRAPHIC STUDIES: I have personally reviewed the radiological images as listed and agreed with the findings in the report: residual activity at the left thoracic inlet.   NM PET Image Restag (PS) Skull Base To Thigh  Result Date: 02/12/2020 CLINICAL DATA:  Subsequent treatment strategy for diffuse large B-cell lymphoma post chemo and radiotherapy. EXAM: NUCLEAR MEDICINE PET SKULL BASE TO THIGH TECHNIQUE: 7.1 mCi F-18 FDG was injected intravenously. Full-ring PET imaging was performed from the skull base to thigh after the  radiotracer. CT data was obtained and used for attenuation correction and anatomic localization. Fasting blood glucose: 101 mg/dl COMPARISON:   11/05/2019 FINDINGS: Mediastinal blood pool activity: SUV max 2.22 Liver activity: SUV max 3.53 NECK: Mild asymmetric activity of the RIGHT vocal cord is diminished when compared to previous imaging. Incidental CT findings: None CHEST: Soft tissue at the LEFT thoracic inlet adjacent to muscular and vascular structures, difficult to separate from surrounding structures shows decreased size in terms of the ill-defined density seen at the thoracic inlet on the prior study. (Image 38, series 4) 2.3 x 1.2 cm as compared to 3.0 x 2.6 cm (SUVmax = 2.92) previously 3.0 Incidental CT findings: Calcified atheromatous plaque of the thoracic aorta and three-vessel coronary artery disease similar to the previous imaging study no pericardial effusion. Interval removal of RIGHT-sided Port-A-Cath. No pericardial or pleural effusion. No consolidation in the chest. No suspicious nodule. Airways are patent. ABDOMEN/PELVIS: No abnormal hypermetabolic activity within the liver, pancreas, adrenal glands, or spleen. No hypermetabolic lymph nodes in the abdomen or pelvis. Incidental CT findings: No acute findings in the abdomen. Normal appendix. Colonic diverticulosis. Post hysterectomy. SKELETON: No focal hypermetabolic activity to suggest skeletal metastasis. Incidental CT findings: Spinal degenerative changes without acute or destructive bone process. IMPRESSION: 1. Continued decrease in size of soft tissue mass at the LEFT thoracic inlet with slightly diminished FDG uptake, above mediastinal but below hepatic activity. Deauville category 3. 2. Diminished activity, relative activity in the RIGHT as compared to the LEFT focal cord, nonspecific. 3. Aortic atherosclerosis. Aortic Atherosclerosis (ICD10-I70.0). Electronically Signed   By: Zetta Bills M.D.   On: 02/12/2020 15:28    ASSESSMENT & PLAN GENOA FREYRE 79 y.o. 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  The interval PET scan was performed on 02/12/2020 showed some residual FDG avidity within the left thoracic inlet, consistent with prior PET CT scan performed on 11/05/2019.  Given the stability of these images I would recommend that we continue to monitor clinically with a 44-monthfollow-up PET CT scan to assure no recurrence or spread.  Symptomatically the patient is doing quite well and is currently gaining back appetite and energy.  She remains hoarse but this has been gradually improving.  We will plan to have the patient return in 3 months time for continued clinical monitoring.  #Stage III DLBCL; BCL2 rearrangement+, poor risk by R-IPI -S/p 6 cycles of R-CHOP with G-CSF and ISRT for residual disease -I reviewed imaging results in detail with the patient -Given the mild residual activity in the LN conglomerate, I have recommend an additional PET in 6 months to monitor the residual activity -If the PET remains stable and does not show any evidence of recurrent disease, then she can be monitored clinically for there on out.  -Finally, given the neck radiation, she would benefit from periodic TSH monitoring for any radiation-induced hypothyroidism  --RTC in 3 months for clinical monitoring/labs.   #Port-A-Cath in place -removed by prior provider.   No orders of the defined types were placed in this encounter.  All questions were answered. The patient knows to call the clinic with any problems, questions or concerns.  A total of more than 40 minutes were spent on this encounter and over half of that time was spent on counseling and coordination of care as outlined above.   JLedell Peoples MD [facility] Phone: [phone] Pager: [phone] Email: [email or web address]  02/17/2020 5:20 PM

## 2020-02-13 ENCOUNTER — Encounter: Payer: Self-pay | Admitting: Hematology and Oncology

## 2020-02-13 ENCOUNTER — Inpatient Hospital Stay: Payer: PPO

## 2020-02-13 ENCOUNTER — Other Ambulatory Visit: Payer: Self-pay

## 2020-02-13 ENCOUNTER — Inpatient Hospital Stay: Payer: PPO | Attending: Hematology | Admitting: Hematology and Oncology

## 2020-02-13 VITALS — BP 123/75 | HR 83 | Temp 97.8°F | Resp 17 | Ht <= 58 in | Wt 132.9 lb

## 2020-02-13 DIAGNOSIS — K219 Gastro-esophageal reflux disease without esophagitis: Secondary | ICD-10-CM | POA: Diagnosis not present

## 2020-02-13 DIAGNOSIS — C8338 Diffuse large B-cell lymphoma, lymph nodes of multiple sites: Secondary | ICD-10-CM | POA: Diagnosis not present

## 2020-02-13 DIAGNOSIS — Z833 Family history of diabetes mellitus: Secondary | ICD-10-CM | POA: Insufficient documentation

## 2020-02-13 DIAGNOSIS — E7800 Pure hypercholesterolemia, unspecified: Secondary | ICD-10-CM | POA: Diagnosis not present

## 2020-02-13 DIAGNOSIS — C8331 Diffuse large B-cell lymphoma, lymph nodes of head, face, and neck: Secondary | ICD-10-CM

## 2020-02-13 DIAGNOSIS — Z79899 Other long term (current) drug therapy: Secondary | ICD-10-CM | POA: Diagnosis not present

## 2020-02-13 DIAGNOSIS — Z87891 Personal history of nicotine dependence: Secondary | ICD-10-CM | POA: Insufficient documentation

## 2020-02-13 DIAGNOSIS — D6481 Anemia due to antineoplastic chemotherapy: Secondary | ICD-10-CM | POA: Diagnosis not present

## 2020-02-13 DIAGNOSIS — Z8249 Family history of ischemic heart disease and other diseases of the circulatory system: Secondary | ICD-10-CM | POA: Insufficient documentation

## 2020-02-13 DIAGNOSIS — Z882 Allergy status to sulfonamides status: Secondary | ICD-10-CM | POA: Diagnosis not present

## 2020-02-13 DIAGNOSIS — T451X5A Adverse effect of antineoplastic and immunosuppressive drugs, initial encounter: Secondary | ICD-10-CM | POA: Insufficient documentation

## 2020-02-13 DIAGNOSIS — Z885 Allergy status to narcotic agent status: Secondary | ICD-10-CM | POA: Diagnosis not present

## 2020-02-13 DIAGNOSIS — T451X5D Adverse effect of antineoplastic and immunosuppressive drugs, subsequent encounter: Secondary | ICD-10-CM

## 2020-02-13 DIAGNOSIS — R490 Dysphonia: Secondary | ICD-10-CM | POA: Diagnosis not present

## 2020-02-13 DIAGNOSIS — I700 Atherosclerosis of aorta: Secondary | ICD-10-CM | POA: Insufficient documentation

## 2020-02-13 LAB — CBC WITH DIFFERENTIAL (CANCER CENTER ONLY)
Abs Immature Granulocytes: 0.02 10*3/uL (ref 0.00–0.07)
Basophils Absolute: 0 10*3/uL (ref 0.0–0.1)
Basophils Relative: 0 %
Eosinophils Absolute: 0.1 10*3/uL (ref 0.0–0.5)
Eosinophils Relative: 3 %
HCT: 40.2 % (ref 36.0–46.0)
Hemoglobin: 12.9 g/dL (ref 12.0–15.0)
Immature Granulocytes: 0 %
Lymphocytes Relative: 25 %
Lymphs Abs: 1.3 10*3/uL (ref 0.7–4.0)
MCH: 27.1 pg (ref 26.0–34.0)
MCHC: 32.1 g/dL (ref 30.0–36.0)
MCV: 84.5 fL (ref 80.0–100.0)
Monocytes Absolute: 0.6 10*3/uL (ref 0.1–1.0)
Monocytes Relative: 12 %
Neutro Abs: 3.1 10*3/uL (ref 1.7–7.7)
Neutrophils Relative %: 60 %
Platelet Count: 307 10*3/uL (ref 150–400)
RBC: 4.76 MIL/uL (ref 3.87–5.11)
RDW: 15.7 % — ABNORMAL HIGH (ref 11.5–15.5)
WBC Count: 5.2 10*3/uL (ref 4.0–10.5)
nRBC: 0 % (ref 0.0–0.2)

## 2020-02-13 LAB — CMP (CANCER CENTER ONLY)
ALT: 19 U/L (ref 0–44)
AST: 19 U/L (ref 15–41)
Albumin: 3.6 g/dL (ref 3.5–5.0)
Alkaline Phosphatase: 90 U/L (ref 38–126)
Anion gap: 7 (ref 5–15)
BUN: 11 mg/dL (ref 8–23)
CO2: 25 mmol/L (ref 22–32)
Calcium: 9.2 mg/dL (ref 8.9–10.3)
Chloride: 108 mmol/L (ref 98–111)
Creatinine: 0.72 mg/dL (ref 0.44–1.00)
GFR, Est AFR Am: >60 mL/min (ref >60)
GFR, Estimated: >60 mL/min (ref >60)
Glucose, Bld: 97 mg/dL (ref 70–99)
Potassium: 4.1 mmol/L (ref 3.5–5.1)
Sodium: 140 mmol/L (ref 135–145)
Total Bilirubin: 0.5 mg/dL (ref 0.3–1.2)
Total Protein: 6.7 g/dL (ref 6.5–8.1)

## 2020-02-13 LAB — LACTATE DEHYDROGENASE: LDH: 187 U/L (ref 98–192)

## 2020-02-13 LAB — TSH: TSH: 0.914 u[IU]/mL (ref 0.308–3.960)

## 2020-02-13 MED ORDER — TRAZODONE HCL 50 MG PO TABS: 50.0000 mg | ORAL_TABLET | Freq: Every day | ORAL | 1 refills | Status: DC

## 2020-02-13 MED ORDER — PANTOPRAZOLE SODIUM 20 MG PO TBEC: 20.0000 mg | DELAYED_RELEASE_TABLET | Freq: Every day | ORAL | 3 refills | Status: DC

## 2020-02-14 ENCOUNTER — Encounter: Payer: Self-pay | Admitting: Hematology and Oncology

## 2020-04-04 ENCOUNTER — Other Ambulatory Visit: Payer: Self-pay | Admitting: Family Medicine

## 2020-04-04 DIAGNOSIS — Z1231 Encounter for screening mammogram for malignant neoplasm of breast: Secondary | ICD-10-CM

## 2020-04-08 ENCOUNTER — Ambulatory Visit: Payer: PPO | Admitting: Podiatry

## 2020-04-21 ENCOUNTER — Telehealth: Payer: Self-pay | Admitting: Hematology and Oncology

## 2020-04-21 ENCOUNTER — Other Ambulatory Visit: Payer: Self-pay

## 2020-04-21 ENCOUNTER — Ambulatory Visit
Admission: RE | Admit: 2020-04-21 | Discharge: 2020-04-21 | Disposition: A | Discharge status: Home / Self Care | Payer: PPO | Source: Ambulatory Visit | Attending: Family Medicine | Admitting: Family Medicine

## 2020-04-21 DIAGNOSIS — Z1231 Encounter for screening mammogram for malignant neoplasm of breast: Secondary | ICD-10-CM

## 2020-04-21 NOTE — Telephone Encounter (Signed)
Called pt per 8/30 sch msg - no answer. Left message for patient to call back

## 2020-04-23 ENCOUNTER — Encounter: Payer: Self-pay | Admitting: Pulmonary Disease

## 2020-04-23 ENCOUNTER — Other Ambulatory Visit: Payer: Self-pay | Admitting: Family Medicine

## 2020-04-23 ENCOUNTER — Ambulatory Visit (INDEPENDENT_AMBULATORY_CARE_PROVIDER_SITE_OTHER): Payer: PPO | Admitting: Pulmonary Disease

## 2020-04-23 ENCOUNTER — Other Ambulatory Visit: Payer: Self-pay

## 2020-04-23 VITALS — BP 116/68 | HR 92 | Temp 97.3°F | Ht <= 58 in | Wt 133.4 lb

## 2020-04-23 DIAGNOSIS — R05 Cough: Secondary | ICD-10-CM | POA: Diagnosis not present

## 2020-04-23 DIAGNOSIS — R928 Other abnormal and inconclusive findings on diagnostic imaging of breast: Secondary | ICD-10-CM

## 2020-04-23 DIAGNOSIS — R0602 Shortness of breath: Secondary | ICD-10-CM | POA: Diagnosis not present

## 2020-04-23 DIAGNOSIS — R059 Cough, unspecified: Secondary | ICD-10-CM

## 2020-04-23 NOTE — Progress Notes (Signed)
Subjective:   PATIENT ID: Sandra Payne GENDER: female DOB: 07-17-1941, MRN: [id number]   HPI  Chief Complaint  Patient presents with  . Consult    shortness of breath with exertion    Reason for Visit: New consult for shortness of breath/cough  Ms. Sandra Payne is is 79 year old female hx stage III DLBCL s/p chemotherapy and radiation and arthritis who presents for cough and shortness of breath.   In the last 4-5 months, she reports intermittent cough that rarely produces sputum. Sometimes wheezing at night. Her dyspnea occurs with moderate exertion including when walking uphill with her trash can. Otherwise her cough and shortness of breath do not limit her activity and feels that the symptoms are mild. This seems to worsen with pollen in the spring or with cold air. Has not tried anything to address her symptoms. Not on any inhalers. No childhood asthma or environmental exposures during her lifetime that she recalls. Denies associated chest pain, chronic sputum production, hemoptysis.   Social History: Negligible smoking history >40 years ago and for <1 year  I have personally reviewed patient's past medical/family/social history, allergies, current medications.  Past Medical History:  Diagnosis Date  . Anemia   . Arthralgia   . Arthritis   . Dyspnea    increased exertion  . Elevated cholesterol   . History of radiation therapy 08/20/19- 09/24/19   lower left neck 25 fractions of 1.8 Gy to total 45 Gy.   . Insomnia   . Lymphoma ([place])   . Neck mass    left     Family History  Problem Relation Age of Onset  . Diabetes Mother   . Hypertension Mother   . Breast cancer Neg Hx      Social History   Occupational History  . Not on file  Tobacco Use  . Smoking status: Former Smoker    Packs/day: 0.25    Years: 2.00    Pack years: 0.50    Types: Cigarettes  . Smokeless tobacco: Never Used  . Tobacco comment: she quit 30-40 years ago.   Vaping Use  . Vaping  Use: Never used  Substance and Sexual Activity  . Alcohol use: Yes    Alcohol/week: 0.0 standard drinks    Comment: occasionally  . Drug use: No  . Sexual activity: Not on file    Allergies  Allergen Reactions  . Codeine Itching  . Sulfa Antibiotics Other (See Comments)    Childhood allergy     Outpatient Medications Prior to Visit  Medication Sig Dispense Refill  . acetaminophen (TYLENOL) 325 MG tablet Take 650 mg by mouth every 6 (six) hours as needed for mild pain or headache.     . albuterol (VENTOLIN HFA) 108 (90 Base) MCG/ACT inhaler Inhale 2 puffs into the lungs every 6 (six) hours as needed.    . Azelastine HCl 0.15 % SOLN     . celecoxib (CELEBREX) 200 MG capsule Take 200 mg by mouth every other day.    . cetirizine (ZYRTEC) 10 MG tablet Take 10 mg by mouth daily.    . montelukast (SINGULAIR) 10 MG tablet Take 10 mg by mouth at bedtime.     . Multiple Vitamin (MULTIVITAMIN) tablet Take 1 tablet by mouth daily.    [name] Kitchen omeprazole (PRILOSEC) 40 MG capsule Take 40 mg by mouth daily.    . traZODone (DESYREL) 50 MG tablet Take 1 tablet (50 mg total) by mouth at bedtime. (Patient not  taking: Reported on 04/23/2020) 90 tablet 1  . ciclopirox (PENLAC) 8 % solution Apply topically at bedtime. Apply over nail and surrounding skin. Apply daily over previous coat. After seven (7) days, may remove with alcohol and continue cycle. (Patient not taking: Reported on 04/23/2020) 6.6 mL 2  . pantoprazole (PROTONIX) 20 MG tablet Take 1 tablet (20 mg total) by mouth daily. 90 tablet 3   No facility-administered medications prior to visit.    Review of Systems  Constitutional: Negative for chills, diaphoresis, fever, malaise/fatigue and weight loss.  HENT: Negative for congestion, ear pain and sore throat.   Respiratory: Positive for cough, shortness of breath and wheezing. Negative for hemoptysis and sputum production.   Cardiovascular: Negative for chest pain, palpitations and leg swelling.    Gastrointestinal: Negative for abdominal pain, heartburn and nausea.  Genitourinary: Negative for frequency.  Musculoskeletal: Positive for joint pain. Negative for myalgias.  Skin: Negative for itching and rash.  Neurological: Negative for dizziness, weakness and headaches.  Endo/Heme/Allergies: Does not bruise/bleed easily.  Psychiatric/Behavioral: Negative for depression. The patient is not nervous/anxious.      Objective:   Vitals:   04/23/20 1402  BP: 116/68  Pulse: 92  Temp: (!) 97.3 F (36.3 C)  TempSrc: Other (Comment)  SpO2: 98%  Weight: 133 lb 6.4 oz (60.5 kg)  Height: 4\' 10"  (1.473 m)   SpO2: 98 % (room air) O2 Device: None (Room air)  Physical Exam: General: Well-appearing, no acute distress HENT: [place], AT Eyes: EOMI, no scleral icterus Respiratory: Clear to auscultation bilaterally.  No crackles, wheezing or rales Cardiovascular: RRR, -M/R/G, no JVD GI: BS+, soft, nontender Extremities:-Edema,-tenderness Neuro: AAO x4, CNII-XII grossly intact Skin: Intact, no rashes or bruising Psych: Normal mood, normal affect  Data Reviewed:  Imaging: PET-CT 02/12/20 - Decreased left thoracic inlet soft tissue mass. Diminished activity in vocal coards and soft tissue mass  PFT: None on file  Labs: CBC    Component Value Date/Time   WBC 5.2 02/13/2020 1033   WBC 5.2 11/20/2019 1321   RBC 4.76 02/13/2020 1033   HGB 12.9 02/13/2020 1033   HCT 40.2 02/13/2020 1033   PLT 307 02/13/2020 1033   MCV 84.5 02/13/2020 1033   MCH 27.1 02/13/2020 1033   MCHC 32.1 02/13/2020 1033   RDW 15.7 (H) 02/13/2020 1033   LYMPHSABS 1.3 02/13/2020 1033   MONOABS 0.6 02/13/2020 1033   EOSABS 0.1 02/13/2020 1033   BASOSABS 0.0 02/13/2020 1033   CMP Latest Ref Rng & Units 02/13/2020 11/07/2019 10/03/2019  Glucose 70 - 99 mg/dL 97 101(H) 87  BUN 8 - 23 mg/dL 11 10 13   Creatinine 0.44 - 1.00 mg/dL 0.72 0.67 0.66  Sodium 135 - 145 mmol/L 140 142 142  Potassium 3.5 - 5.1 mmol/L 4.1  3.7 3.9  Chloride 98 - 111 mmol/L 108 111 107  CO2 22 - 32 mmol/L 25 23 26   Calcium 8.9 - 10.3 mg/dL 9.2 8.8(L) 9.0  Total Protein 6.5 - 8.1 g/dL 6.7 6.5 6.6  Total Bilirubin 0.3 - 1.2 mg/dL 0.5 0.4 0.2(L)  Alkaline Phos 38 - 126 U/L 90 78 79  AST 15 - 41 U/L 19 22 17   ALT 0 - 44 U/L 19 12 16       Assessment & Plan:   Discussion: 79 year old female hx stage III DLBCL s/p chemotherapy and radiation and arthritis who presents for cough and shortness of breath. Mild symptoms. Will rule out underlying obstructive or restrictive lung disease. With  her prior hx of fibrosis she has a risk of radiation fibrosis contributing to her symptoms. We discussed the utility of further evaluation including bronchodilators if indicated.  Shortness of breath Cough --Will arrange for pulmonary function tests (PFTs)  Health Maintenance Immunization History  Administered Date(s) Administered  . Fluad Quad(high Dose 65+) 05/16/2019  . PFIZER SARS-COV-2 Vaccination 09/27/2019, 10/22/2019   CT Lung Screen - not qualified due to lack of smoking history  Orders Placed This Encounter  Procedures  . Pulmonary function test    Standing Status:   Future    Standing Expiration Date:   04/23/2021    Order Specific Question:   Where should this test be performed?    Answer:   [facility]    Order Specific Question:   Full PFT: includes the following: basic spirometry, spirometry pre & post bronchodilator, diffusion capacity (DLCO), lung volumes    Answer:   Full PFT  No orders of the defined types were placed in this encounter.   Return for PFTs.  I have spent a total time of 31-minutes on the day of the appointment reviewing prior documentation, coordinating care and discussing medical diagnosis and plan with the patient/family. Imaging, labs and tests included in this note have been reviewed and interpreted independently by me.  Marvin, MD [facility] 04/23/2020 3:42 PM   Office Number [phone]

## 2020-04-23 NOTE — Patient Instructions (Signed)
Shortness of breath Cough --Will arrange for pulmonary function tests (PFTs)

## 2020-04-29 DIAGNOSIS — M19042 Primary osteoarthritis, left hand: Secondary | ICD-10-CM | POA: Diagnosis not present

## 2020-05-05 ENCOUNTER — Other Ambulatory Visit: Payer: Self-pay

## 2020-05-05 ENCOUNTER — Ambulatory Visit
Admission: RE | Admit: 2020-05-05 | Discharge: 2020-05-05 | Disposition: A | Discharge status: Home / Self Care | Payer: PPO | Source: Ambulatory Visit | Attending: Family Medicine | Admitting: Family Medicine

## 2020-05-05 DIAGNOSIS — R928 Other abnormal and inconclusive findings on diagnostic imaging of breast: Secondary | ICD-10-CM

## 2020-05-05 DIAGNOSIS — N6489 Other specified disorders of breast: Secondary | ICD-10-CM | POA: Diagnosis not present

## 2020-05-05 DIAGNOSIS — R922 Inconclusive mammogram: Secondary | ICD-10-CM | POA: Diagnosis not present

## 2020-05-14 DIAGNOSIS — Z23 Encounter for immunization: Secondary | ICD-10-CM | POA: Diagnosis not present

## 2020-05-14 DIAGNOSIS — G4700 Insomnia, unspecified: Secondary | ICD-10-CM | POA: Diagnosis not present

## 2020-05-14 DIAGNOSIS — R21 Rash and other nonspecific skin eruption: Secondary | ICD-10-CM | POA: Diagnosis not present

## 2020-05-15 ENCOUNTER — Inpatient Hospital Stay: Payer: PPO | Attending: Hematology

## 2020-05-15 ENCOUNTER — Other Ambulatory Visit: Payer: Self-pay

## 2020-05-15 ENCOUNTER — Other Ambulatory Visit: Payer: Self-pay | Admitting: Hematology and Oncology

## 2020-05-15 ENCOUNTER — Ambulatory Visit: Payer: PPO | Admitting: Hematology and Oncology

## 2020-05-15 ENCOUNTER — Inpatient Hospital Stay (HOSPITAL_BASED_OUTPATIENT_CLINIC_OR_DEPARTMENT_OTHER): Payer: PPO | Admitting: Hematology and Oncology

## 2020-05-15 ENCOUNTER — Other Ambulatory Visit: Payer: PPO

## 2020-05-15 VITALS — BP 126/72 | HR 84 | Temp 97.9°F | Resp 18 | Ht <= 58 in | Wt 132.4 lb

## 2020-05-15 DIAGNOSIS — Z90710 Acquired absence of both cervix and uterus: Secondary | ICD-10-CM | POA: Diagnosis not present

## 2020-05-15 DIAGNOSIS — Z87891 Personal history of nicotine dependence: Secondary | ICD-10-CM | POA: Insufficient documentation

## 2020-05-15 DIAGNOSIS — E876 Hypokalemia: Secondary | ICD-10-CM | POA: Diagnosis not present

## 2020-05-15 DIAGNOSIS — C8331 Diffuse large B-cell lymphoma, lymph nodes of head, face, and neck: Secondary | ICD-10-CM

## 2020-05-15 LAB — CMP (CANCER CENTER ONLY)
ALT: 18 U/L (ref 0–44)
AST: 20 U/L (ref 15–41)
Albumin: 3.5 g/dL (ref 3.5–5.0)
Alkaline Phosphatase: 78 U/L (ref 38–126)
Anion gap: 5 (ref 5–15)
BUN: 10 mg/dL (ref 8–23)
CO2: 29 mmol/L (ref 22–32)
Calcium: 9.2 mg/dL (ref 8.9–10.3)
Chloride: 105 mmol/L (ref 98–111)
Creatinine: 0.72 mg/dL (ref 0.44–1.00)
GFR, Est AFR Am: >60 mL/min (ref >60)
GFR, Estimated: >60 mL/min (ref >60)
Glucose, Bld: 114 mg/dL — ABNORMAL HIGH (ref 70–99)
Potassium: 3.1 mmol/L — ABNORMAL LOW (ref 3.5–5.1)
Sodium: 139 mmol/L (ref 135–145)
Total Bilirubin: 0.6 mg/dL (ref 0.3–1.2)
Total Protein: 6.7 g/dL (ref 6.5–8.1)

## 2020-05-15 LAB — CBC WITH DIFFERENTIAL (CANCER CENTER ONLY)
Abs Immature Granulocytes: 0.02 10*3/uL (ref 0.00–0.07)
Basophils Absolute: 0 10*3/uL (ref 0.0–0.1)
Basophils Relative: 0 %
Eosinophils Absolute: 0.1 10*3/uL (ref 0.0–0.5)
Eosinophils Relative: 2 %
HCT: 37.5 % (ref 36.0–46.0)
Hemoglobin: 12 g/dL (ref 12.0–15.0)
Immature Granulocytes: 0 %
Lymphocytes Relative: 19 %
Lymphs Abs: 1.1 10*3/uL (ref 0.7–4.0)
MCH: 26.8 pg (ref 26.0–34.0)
MCHC: 32 g/dL (ref 30.0–36.0)
MCV: 83.9 fL (ref 80.0–100.0)
Monocytes Absolute: 0.7 10*3/uL (ref 0.1–1.0)
Monocytes Relative: 12 %
Neutro Abs: 3.8 10*3/uL (ref 1.7–7.7)
Neutrophils Relative %: 67 %
Platelet Count: 271 10*3/uL (ref 150–400)
RBC: 4.47 MIL/uL (ref 3.87–5.11)
RDW: 15.6 % — ABNORMAL HIGH (ref 11.5–15.5)
WBC Count: 5.7 10*3/uL (ref 4.0–10.5)
nRBC: 0 % (ref 0.0–0.2)

## 2020-05-15 LAB — LACTATE DEHYDROGENASE: LDH: 204 U/L — ABNORMAL HIGH (ref 98–192)

## 2020-05-15 MED ORDER — POTASSIUM CHLORIDE CRYS ER 20 MEQ PO TBCR: 20.0000 meq | EXTENDED_RELEASE_TABLET | Freq: Every day | ORAL | 0 refills | Status: DC

## 2020-05-15 NOTE — Progress Notes (Signed)
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 BCL2 rearrangement positive, no BCL6 or Myc rearrangement -03/2019: left supraclavicular (bulky), left axillary, RP, mesenteric and iliac adenopathy on PET; bone marrow bx negative for lymphoma  -Mid-03/2019 - late 06/2019: R-CHOP with G-CSF support  ? EOT PET showed decreased but residual left thoracic inlet LN disease ([place] 5); resolution of other sites of disease  -Late 07/2019 - 09/2019: ISRT, 45 Gy/25 fractions  ? Interim PET showed resolving FDG avidity in the LN's in the thoracic inlet ([place] 3); no new disease  ? 02/12/2020: repeat PET CT shows residual FDG avidity in the LN's in the thoracic inlet ([place] 3); no new disease  2. Port placed in 03/2019  TREATMENT REGIMEN:  04/04/2019 - 07/20/2019: R-CHOP with Udenyca x 6 cycles  08/20/2019 - 09/24/2019: ISRT, 45 Gy/25 fractions; completed on 09/24/2019   Interval History:  Sandra Payne 79 y.o. 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 The patient's last visit was on 02/13/2020 after she established care with Dr. Lorenso Courier and reviewed the results of her PET  CT. In the interim since the last visit she has had no hospitalizations or ED visits.  On exam today Sandra Payne notes that she has been well in the interim since her last visit.  She reports that she is had no issues with fevers, chills, sweats, nausea, vomiting or shortness of breath.  She has had some itching on her left thigh which she thinks may be secondary to a bug bite.  She also reports that her weight has been stable.  She does report having a recent bout of diarrhea and thinks that might be why her potassium is low today.  She notes that this is resolved with Imodium therapy and that she is now recovering from that episode.  Otherwise she reports no changes in her health.  A full 10 point ROS is listed below.  MEDICAL HISTORY:  Past Medical History:  Diagnosis Date  . Anemia   . Arthralgia   . Arthritis   . Dyspnea    increased exertion  . Elevated cholesterol   . History of radiation therapy 08/20/19- 09/24/19   lower left neck 25 fractions of 1.8 Gy to total 45 Gy.   . Insomnia   . Lymphoma ([place])   . Neck mass    left    SURGICAL HISTORY: Past Surgical History:  Procedure Laterality Date  . ABDOMINAL HYSTERECTOMY     partial  . IR IMAGING GUIDED PORT INSERTION  03/28/2019  . IR REMOVAL TUN ACCESS W/ PORT W/O FL MOD SED  11/20/2019  . MASS BIOPSY Left 03/12/2019   Procedure: NECK MASS BIOPSY;  Surgeon: Izora Gala, MD;  Location: [facility];  Service: ENT;  Laterality: Left;  . TOTAL KNEE  ARTHROPLASTY Right 01/06/2015   Procedure: RIGHT TOTAL KNEE ARTHROPLASTY;  Surgeon: Frederik Pear, MD;  Location: [facility];  Service: Orthopedics;  Laterality: Right;    SOCIAL HISTORY: Social History   Socioeconomic History  . Marital status: Widowed    Spouse name: Not on file  . Number of children: 1  . Years of education: Not on file  . Highest education level: Not on file  Occupational History  . Not on file  Tobacco Use  . Smoking status: Former Smoker     Packs/day: 0.25    Years: 2.00    Pack years: 0.50    Types: Cigarettes  . Smokeless tobacco: Never Used  . Tobacco comment: she quit 30-40 years ago.   Vaping Use  . Vaping Use: Never used  Substance and Sexual Activity  . Alcohol use: Yes    Alcohol/week: 0.0 standard drinks    Comment: occasionally  . Drug use: No  . Sexual activity: Not on file  Other Topics Concern  . Not on file  Social History Narrative   Lives alone in a 2 story home.  Husband passed away on 2014-09-15.     Works one day a week at [employer].     Retired from [employer].  Exercises regularly.    Patient is right-handed.   Social Determinants of Health   Financial Resource Strain:   . Difficulty of Paying Living Expenses: Not on file  Food Insecurity:   . Worried About Charity fundraiser in the Last Year: Not on file  . Ran Out of Food in the Last Year: Not on file  Transportation Needs: No Transportation Needs  . Lack of Transportation (Medical): No  . Lack of Transportation (Non-Medical): No  Physical Activity:   . Days of Exercise per Week: Not on file  . Minutes of Exercise per Session: Not on file  Stress:   . Feeling of Stress : Not on file  Social Connections:   . Frequency of Communication with Friends and Family: Not on file  . Frequency of Social Gatherings with Friends and Family: Not on file  . Attends Religious Services: Not on file  . Active Member of Clubs or Organizations: Not on file  . Attends Archivist Meetings: Not on file  . Marital Status: Not on file  Intimate Partner Violence: Not At Risk  . Fear of Current or Ex-Partner: No  . Emotionally Abused: No  . Physically Abused: No  . Sexually Abused: No    FAMILY HISTORY: Family History  Problem Relation Age of Onset  . Diabetes Mother   . Hypertension Mother   . Breast cancer Neg Hx     ALLERGIES:  is allergic to codeine and sulfa antibiotics.  MEDICATIONS:   Current Outpatient Medications  Medication Sig Dispense Refill  . acetaminophen (TYLENOL) 325 MG tablet Take 650 mg by mouth every 6 (six) hours as needed for mild pain or headache.     . albuterol (VENTOLIN HFA) 108 (90 Base) MCG/ACT inhaler Inhale 2 puffs into the lungs every 6 (six) hours as needed.    . Azelastine HCl 0.15 % SOLN     . celecoxib (CELEBREX) 200 MG capsule Take 200 mg by mouth every other day.    . cetirizine (ZYRTEC) 10 MG tablet Take 10 mg by mouth daily.    . montelukast (SINGULAIR) 10 MG tablet Take 10 mg by mouth at bedtime.     [name] Kitchen  Multiple Vitamin (MULTIVITAMIN) tablet Take 1 tablet by mouth daily.    [name] Kitchen omeprazole (PRILOSEC) 40 MG capsule Take 40 mg by mouth daily.    . potassium chloride SA (KLOR-CON) 20 MEQ tablet Take 1 tablet (20 mEq total) by mouth daily for 10 doses. 10 tablet 0   No current facility-administered medications for this visit.    REVIEW OF SYSTEMS:   Constitutional: ( - ) fevers, ( - )  chills , ( - ) night sweats Eyes: ( - ) blurriness of vision, ( - ) double vision, ( - ) watery eyes Ears, nose, mouth, throat, and face: ( - ) mucositis, ( - ) sore throat Respiratory: ( - ) cough, ( - ) dyspnea, ( - ) wheezes Cardiovascular: ( - ) palpitation, ( - ) chest discomfort, ( - ) lower extremity swelling Gastrointestinal:  ( - ) nausea, ( - ) heartburn, ( - ) change in bowel habits Skin: ( - ) abnormal skin rashes Lymphatics: ( - ) new lymphadenopathy, ( - ) easy bruising Neurological: ( - ) numbness, ( - ) tingling, ( - ) new weaknesses Behavioral/Psych: ( - ) mood change, ( - ) new changes  All other systems were reviewed with the patient and are negative.  PHYSICAL EXAMINATION: ECOG PERFORMANCE STATUS: 1 - Symptomatic but completely ambulatory  Vitals:   05/15/20 1532  BP: 126/72  Pulse: 84  Resp: 18  Temp: 97.9 F (36.6 C)  SpO2: 99%   Filed Weights   05/15/20 1532  Weight: 132 lb 6.4 oz (60.1 kg)    GENERAL: well appearing  elderly African American female. Alert, no distress and comfortable SKIN: skin color, texture, turgor are normal, no rashes or significant lesions EYES: conjunctiva are pink and non-injected, sclera clear NECK: supple, non-tender LYMPH:  no palpable lymphadenopathy in the cervical, axillary or supraclavicular lymph nodes.  LUNGS: clear to auscultation and percussion with normal breathing effort HEART: regular rate & rhythm and no murmurs and no lower extremity edema Musculoskeletal: no cyanosis of digits and no clubbing  PSYCH: alert & oriented x 3, fluent speech NEURO: no focal motor/sensory deficits  LABORATORY DATA:  I have reviewed the data as listed CBC Latest Ref Rng & Units 05/15/2020 02/13/2020 11/20/2019  WBC 4.0 - 10.5 K/uL 5.7 5.2 5.2  Hemoglobin 12.0 - 15.0 g/dL 12.0 12.9 13.1  Hematocrit 36 - 46 % 37.5 40.2 41.6  Platelets 150 - 400 K/uL 271 307 290    CMP Latest Ref Rng & Units 05/15/2020 02/13/2020 11/07/2019  Glucose 70 - 99 mg/dL 114(H) 97 101(H)  BUN 8 - 23 mg/dL _0 Creatinine 0.44 - 1.00 mg/dL 0.72 0.72 0.67  Sodium 135 - 145 mmol/L 139 140 142  Potassium 3.5 - 5.1 mmol/L 3.1(L) 4.1 3.7  Chloride 98 - 111 mmol/L 105 108 111  CO2 22 - 32 mmol/L _1 Calcium 8.9 - 10.3 mg/dL 9.2 9.2 8.8(L)  Total Protein 6.5 - 8.1 g/dL 6.7 6.7 6.5  Total Bilirubin 0.3 - 1.2 mg/dL 0.6 0.5 0.4  Alkaline Phos 38 - 126 U/L 78 90 78  AST 15 - 41 U/L _2 ALT 0 - 44 U/L _3 RADIOGRAPHIC STUDIES: I have personally reviewed the radiological images as listed and agreed with the findings in the report: residual activity at the left thoracic inlet.   US BREAST LTD UNI RIGHT INC AXILLA  Result Date: 05/05/2020 CLINICAL DATA:  Screening recall for a possible asymmetry in the right breast. This patient has undergone treatment for lymphoma, beginning in 2020, completing her therapy in 2021. A Port-A-Cath was placed on the right, in the upper inner aspect of the right  breast, in 2020 prior to a PET scan dated 07/30/2019. The Port-A-Cath was in place on the PET scan dated 02/12/2020 and was removed subsequently. Her prior screening mammogram was prior to her Port-A-Cath placement. EXAM: DIGITAL DIAGNOSTIC RIGHT MAMMOGRAM WITH CAD AND TOMO ULTRASOUND RIGHT BREAST COMPARISON:  Previous exam(s). ACR Breast Density Category c: The breast tissue is heterogeneously dense, which may obscure small masses. FINDINGS: The area of asymmetry persists on the diagnostic spot-compression images. There is an oval masslike component along its anterior margin measuring 7 8 mm in long axis. The position of the asymmetry in masses consistent with the scar from the Port-A-Cath. Mammographic images were processed with CAD. On physical exam, no discrete mass is palpated posterior to the Port-A-Cath scar in the upper inner right breast. Targeted ultrasound is performed, showing an oval cyst, most likely a small seroma, measuring 8 x 4 x 6 mm, in the right breast at 1 o'clock, 10 cm the nipple, deep to the Port-A-Cath scar. No solid masses or suspicious lesions. IMPRESSION: 1. No evidence of breast malignancy. 2. Benign area scarring with a small, 8 mm seroma, in the upper inner right breast, to the previous placement of the Port-A-Cath. RECOMMENDATION: 1.  Screening mammogram in one year.(Code:SM-B-01Y) I have discussed the findings and recommendations with the patient. If applicable, a reminder letter will be sent to the patient regarding the next appointment. BI-RADS CATEGORY  2: Benign. Electronically Signed   By: Lajean Manes M.D.   On: 05/05/2020 10:39   MM DIAG BREAST TOMO UNI RIGHT  Result Date: 05/05/2020 CLINICAL DATA:  Screening recall for a possible asymmetry in the right breast. This patient has undergone treatment for lymphoma, beginning in 2020, completing her therapy in 2021. A Port-A-Cath was placed on the right, in the upper inner aspect of the right breast, in 2020 prior to a PET  scan dated 07/30/2019. The Port-A-Cath was in place on the PET scan dated 02/12/2020 and was removed subsequently. Her prior screening mammogram was prior to her Port-A-Cath placement. EXAM: DIGITAL DIAGNOSTIC RIGHT MAMMOGRAM WITH CAD AND TOMO ULTRASOUND RIGHT BREAST COMPARISON:  Previous exam(s). ACR Breast Density Category c: The breast tissue is heterogeneously dense, which may obscure small masses. FINDINGS: The area of asymmetry persists on the diagnostic spot-compression images. There is an oval masslike component along its anterior margin measuring 7 8 mm in long axis. The position of the asymmetry in masses consistent with the scar from the Port-A-Cath. Mammographic images were processed with CAD. On physical exam, no discrete mass is palpated posterior to the Port-A-Cath scar in the upper inner right breast. Targeted ultrasound is performed, showing an oval cyst, most likely a small seroma, measuring 8 x 4 x 6 mm, in the right breast at 1 o'clock, 10 cm the nipple, deep to the Port-A-Cath scar. No solid masses or suspicious lesions. IMPRESSION: 1. No evidence of breast malignancy. 2. Benign area scarring with a small, 8 mm seroma, in the upper inner right breast, to the previous placement of the Port-A-Cath. RECOMMENDATION: 1.  Screening mammogram in one year.(Code:SM-B-01Y) I have discussed the findings and recommendations with the patient. If applicable, a reminder letter will be sent to the patient regarding the next appointment. BI-RADS CATEGORY  2: Benign. Electronically  Signed   By: Lajean Manes M.D.   On: 05/05/2020 10:39   MM 3D SCREEN BREAST BILATERAL  Result Date: 04/22/2020 CLINICAL DATA:  Screening. EXAM: DIGITAL SCREENING BILATERAL MAMMOGRAM WITH TOMO AND CAD COMPARISON:  Previous exams. ACR Breast Density Category c: The breast tissue is heterogeneously dense, which may obscure small masses. FINDINGS: In the right breast, a possible asymmetry warrants further evaluation. In the left  breast, no findings suspicious for malignancy. Images were processed with CAD. IMPRESSION: Further evaluation is suggested for possible asymmetry in the right breast. RECOMMENDATION: Diagnostic mammogram and possibly ultrasound of the right breast. (Code:[id number]-63M) The patient will be contacted regarding the findings, and additional imaging will be scheduled. BI-RADS CATEGORY  0: Incomplete. Need additional imaging evaluation and/or prior mammograms for comparison. Electronically Signed   By: Everlean Alstrom M.D.   On: 04/22/2020 15:02    ASSESSMENT & PLAN Sandra Payne 79 y.o. 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  An interval PET scan was performed on 02/12/2020 showed some residual FDG avidity within the left thoracic inlet, consistent with prior PET CT scan performed on 11/05/2019.  Given the stability of these images I would recommend that we continue to monitor clinically with a 48-monthfollow-up CT scan to assure no recurrence or spread.  Symptomatically the patient is doing quite well and is currently gaining back appetite and energy.  We will plan to have the patient return in 3 months time for continued clinical monitoring.  On exam today Ms. GShoreyis at her baseline level of health with the exception of a recent bout of diarrhea which is a likely cause of her hypokalemia.  She is recovering from this well and is not currently having any other signs or symptoms of recurrent disease at this time  #Stage III DLBCL; BCL2 rearrangement+, poor risk by R-IPI -S/p 6 cycles of R-CHOP with G-CSF and ISRT for residual disease -I reviewed imaging results in detail with the patient -Given the mild residual activity in the LN conglomerate, I have recommended an additional CT scan in 6 months to assure stable lymphadenopathy.  -If the CT remains stable and does not show any evidence of recurrent disease, then she can be  monitored clinically for there on out.  -Finally, given the neck radiation, she would benefit from periodic TSH monitoring for any radiation-induced hypothyroidism  --RTC in 3 months for clinical monitoring/labs.   #Port-A-Cath in place -removed by prior provider.   Orders Placed This Encounter  Procedures  . CT CHEST ABDOMEN PELVIS W CONTRAST    Standing Status:   Future    Standing Expiration Date:   05/16/2021    Order Specific Question:   If indicated for the ordered procedure, I authorize the administration of contrast media per Radiology protocol    Answer:   Yes    Order Specific Question:   [facility]    [facility]   Order Specific Question:   Is Oral Contrast requested for this exam?    Answer:   Yes, Per Radiology protocol    Order Specific Question:   Radiology Contrast Protocol - do NOT remove file path    Answer:   [email or web address]    Order Specific Question:   Reason for Exam (SYMPTOM  OR DIAGNOSIS REQUIRED)    Answer:   f/u scan for lymphoma, assess for interval increase in lymphadenopathy  All questions were answered. The patient knows to call the clinic with any problems, questions or concerns.  A total of more than 30 minutes were spent on this encounter and over half of that time was spent on counseling and coordination of care as outlined above.   Ledell Peoples, MD Department of Hematology/Oncology [place] [facility] Phone: [phone] Pager: [phone] Email: [email or web address]  05/16/2020 3:36 PM

## 2020-05-16 ENCOUNTER — Encounter: Payer: Self-pay | Admitting: Hematology and Oncology

## 2020-05-19 ENCOUNTER — Other Ambulatory Visit: Payer: Self-pay | Admitting: Hematology and Oncology

## 2020-05-19 MED ORDER — POTASSIUM CHLORIDE CRYS ER 20 MEQ PO TBCR: 20.0000 meq | EXTENDED_RELEASE_TABLET | Freq: Every day | ORAL | 0 refills | Status: DC

## 2020-05-26 ENCOUNTER — Telehealth: Payer: Self-pay | Admitting: Hematology and Oncology

## 2020-05-26 NOTE — Telephone Encounter (Signed)
Scheduled per los. Called and left msg. Mailed printout  °

## 2020-05-27 NOTE — Progress Notes (Signed)
@Patient  ID: Sandra Payne, female    DOB: February 01, 1941, 79 y.o.   MRN: [id number]  Chief Complaint  Patient presents with  . Follow-up    PFT performed today.  Pt states she has been doing okay since last visit. States she still becomes SOB with activities and has an occ cough with clear phlegm. Pt also has some mild wheeze.    Referring provider: Jonathon Jordan, MD  HPI: 79 year old female, former smoker. PMH significant for diffuse large B cell lymphoma, anemia, arthritis right knee, benign paroxysmal positional vertigo. Patient of Dr. Loanne Drilling, seen for initial consult on 04/23/20 for shortness of breath.  Previous [facility] pulmonary encounter: 04/23/20- Dr. 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   In the last 4-5 months, she reports intermittent cough that rarely produces sputum. Sometimes wheezing at night. Her dyspnea occurs with moderate exertion including when walking uphill with her trash can. Otherwise her cough and shortness of breath do not limit her activity and feels that the symptoms are mild. This seems to worsen with pollen in the spring or with cold air. Has not tried anything to address her symptoms. Not on any inhalers. No childhood asthma or environmental exposures during her lifetime that she recalls. Denies associated chest pain, chronic sputum production, hemoptysis.   Social History: Negligible smoking history >40 years ago and for <1 year  05/28/2020- Interim hx Patient presents today for 1 month follow-up with PFTs. Dr. Stephanie Acre referred to pulmonary d/t patient complaint of cough and shortness of breath. She is feeling ok today. Reports normal shortness of breath with activities. This is not new and has been ongoing for several years with later age. Cough is mainly upper airway. It is somewhat aggravating. She gets  up small amount of clear mucus in the morning. Nocturnal wheezing, changing position helps. Feels a lot of her symptoms are allergy related, worse during spring/fall. No evidence of obstruction or restriction on pulmonary functoin testing today. 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  Denies fever, night sweats, chest tightness, post nasal drip.   Pulmonary testing: - PFTs 05/28/2020- FVC 1.83 (119%), FEV1 1.54 (132%), ratio 85, TLC 81, DLCOcor 13.23 (86%) No restriction or obstruction, normal diffusion capacity  - 05/28/2020 FENO - 21   Allergies  Allergen Reactions  . Codeine Itching  . Sulfa Antibiotics Other (See Comments)    Childhood allergy    Immunization History  Administered Date(s) Administered  . Fluad Quad(high Dose 65+) 05/16/2019  . Influenza, High Dose Seasonal PF 05/19/2020  . PFIZER SARS-COV-2 Vaccination 09/27/2019, 10/22/2019    Past Medical History:  Diagnosis Date  . Anemia   . Arthralgia   . Arthritis   . Dyspnea    increased exertion  . Elevated cholesterol   . History of radiation therapy 08/20/19- 09/24/19   lower left neck 25 fractions of 1.8 Gy to total 45 Gy.   . Insomnia   . Lymphoma ([place])   . Neck mass    left    Tobacco History: Social History   Tobacco Use  Smoking Status Former Smoker  . Packs/day: 0.25  . Years: 2.00  . Pack years: 0.50  . Types: Cigarettes  Smokeless Tobacco Never Used  Tobacco Comment  she quit 30-40 years ago.    Counseling given: Not Answered Comment: she quit 30-40 years ago.    Outpatient Medications Prior to Visit  Medication Sig Dispense Refill  . acetaminophen (TYLENOL) 325 MG tablet Take 650 mg by mouth every 6 (six) hours as needed for mild pain or headache.     . albuterol (VENTOLIN HFA) 108 (90 Base) MCG/ACT inhaler Inhale 2 puffs into the lungs every 6 (six) hours as needed.    .  Azelastine HCl 0.15 % SOLN     . celecoxib (CELEBREX) 200 MG capsule Take 200 mg by mouth every other day.    . cetirizine (ZYRTEC) 10 MG tablet Take 10 mg by mouth daily.    . montelukast (SINGULAIR) 10 MG tablet Take 10 mg by mouth at bedtime.     . Multiple Vitamin (MULTIVITAMIN) tablet Take 1 tablet by mouth daily.    [name] Kitchen omeprazole (PRILOSEC) 40 MG capsule Take 40 mg by mouth daily.    . potassium chloride SA (KLOR-CON) 20 MEQ tablet Take 1 tablet (20 mEq total) by mouth daily for 10 doses. 10 tablet 0  . zolpidem (AMBIEN) 10 MG tablet Take 5-10 mg by mouth at bedtime as needed.     No facility-administered medications prior to visit.   Review of Systems  Review of Systems  Constitutional: Negative.   Respiratory: Positive for cough.    Physical Exam  BP 104/68 (BP Location: Left Arm, Cuff Size: Normal)   Pulse 97   Ht 4\' 10"  (1.473 m)   Wt 130 lb 6.4 oz (59.1 kg)   SpO2 98%   BMI 27.25 kg/m  Physical Exam Constitutional:      Appearance: Normal appearance.  HENT:     Head: Normocephalic and atraumatic.     Right Ear: Tympanic membrane normal.     Left Ear: Tympanic membrane normal.     Mouth/Throat:     Mouth: Mucous membranes are moist.     Pharynx: Oropharynx is clear.  Cardiovascular:     Rate and Rhythm: Normal rate and regular rhythm.  Pulmonary:     Effort: Pulmonary effort is normal.     Breath sounds: Normal breath sounds.  Skin:    General: Skin is warm and dry.  Neurological:     General: No focal deficit present.     Mental Status: She is alert and oriented to person, place, and time. Mental status is at baseline.  Psychiatric:        Mood and Affect: Mood normal.        Behavior: Behavior normal.        Thought Content: Thought content normal.        Judgment: Judgment normal.      Lab Results:  CBC    Component Value Date/Time   WBC 5.7 05/15/2020 1441   WBC 5.2 11/20/2019 1321   RBC 4.47 05/15/2020 1441   HGB 12.0 05/15/2020 1441   HCT  37.5 05/15/2020 1441   PLT 271 05/15/2020 1441   MCV 83.9 05/15/2020 1441   MCH 26.8 05/15/2020 1441   MCHC 32.0 05/15/2020 1441   RDW 15.6 (H) 05/15/2020 1441   LYMPHSABS 1.1 05/15/2020 1441   MONOABS 0.7 05/15/2020 1441   EOSABS 0.1 05/15/2020 1441   BASOSABS 0.0 05/15/2020 1441    BMET    Component Value Date/Time   NA 139 05/15/2020 1441   NA 137 01/16/2015 0000   K 3.1 (L) 05/15/2020 1441   CL  105 05/15/2020 1441   CO2 29 05/15/2020 1441   GLUCOSE 114 (H) 05/15/2020 1441   BUN 10 05/15/2020 1441   BUN 12 01/16/2015 0000   CREATININE 0.72 05/15/2020 1441   CALCIUM 9.2 05/15/2020 1441   GFRNONAA >60 05/15/2020 1441   GFRAA >60 05/15/2020 1441    BNP No results found for: BNP  ProBNP No results found for: PROBNP  Imaging: US BREAST LTD UNI RIGHT INC AXILLA  Result Date: 05/05/2020 CLINICAL DATA:  Screening recall for a possible asymmetry in the right breast. This patient has undergone treatment for lymphoma, beginning in 2020, completing her therapy in 2021. A Port-A-Cath was placed on the right, in the upper inner aspect of the right breast, in 2020 prior to a PET scan dated 07/30/2019. The Port-A-Cath was in place on the PET scan dated 02/12/2020 and was removed subsequently. Her prior screening mammogram was prior to her Port-A-Cath placement. EXAM: DIGITAL DIAGNOSTIC RIGHT MAMMOGRAM WITH CAD AND TOMO ULTRASOUND RIGHT BREAST COMPARISON:  Previous exam(s). ACR Breast Density Category c: The breast tissue is heterogeneously dense, which may obscure small masses. FINDINGS: The area of asymmetry persists on the diagnostic spot-compression images. There is an oval masslike component along its anterior margin measuring 7 8 mm in long axis. The position of the asymmetry in masses consistent with the scar from the Port-A-Cath. Mammographic images were processed with CAD. On physical exam, no discrete mass is palpated posterior to the Port-A-Cath scar in the upper inner right  breast. Targeted ultrasound is performed, showing an oval cyst, most likely a small seroma, measuring 8 x 4 x 6 mm, in the right breast at 1 o'clock, 10 cm the nipple, deep to the Port-A-Cath scar. No solid masses or suspicious lesions. IMPRESSION: 1. No evidence of breast malignancy. 2. Benign area scarring with a small, 8 mm seroma, in the upper inner right breast, to the previous placement of the Port-A-Cath. RECOMMENDATION: 1.  Screening mammogram in one year.(Code:SM-B-01Y) I have discussed the findings and recommendations with the patient. If applicable, a reminder letter will be sent to the patient regarding the next appointment. BI-RADS CATEGORY  2: Benign. Electronically Signed   By: Lajean Manes M.D.   On: 05/05/2020 10:39   MM DIAG BREAST TOMO UNI RIGHT  Result Date: 05/05/2020 CLINICAL DATA:  Screening recall for a possible asymmetry in the right breast. This patient has undergone treatment for lymphoma, beginning in 2020, completing her therapy in 2021. A Port-A-Cath was placed on the right, in the upper inner aspect of the right breast, in 2020 prior to a PET scan dated 07/30/2019. The Port-A-Cath was in place on the PET scan dated 02/12/2020 and was removed subsequently. Her prior screening mammogram was prior to her Port-A-Cath placement. EXAM: DIGITAL DIAGNOSTIC RIGHT MAMMOGRAM WITH CAD AND TOMO ULTRASOUND RIGHT BREAST COMPARISON:  Previous exam(s). ACR Breast Density Category c: The breast tissue is heterogeneously dense, which may obscure small masses. FINDINGS: The area of asymmetry persists on the diagnostic spot-compression images. There is an oval masslike component along its anterior margin measuring 7 8 mm in long axis. The position of the asymmetry in masses consistent with the scar from the Port-A-Cath. Mammographic images were processed with CAD. On physical exam, no discrete mass is palpated posterior to the Port-A-Cath scar in the upper inner right breast. Targeted ultrasound is  performed, showing an oval cyst, most likely a small seroma, measuring 8 x 4 x 6 mm, in the right breast at 1 o'clock,  10 cm the nipple, deep to the Port-A-Cath scar. No solid masses or suspicious lesions. IMPRESSION: 1. No evidence of breast malignancy. 2. Benign area scarring with a small, 8 mm seroma, in the upper inner right breast, to the previous placement of the Port-A-Cath. RECOMMENDATION: 1.  Screening mammogram in one year.(Code:SM-B-01Y) I have discussed the findings and recommendations with the patient. If applicable, a reminder letter will be sent to the patient regarding the next appointment. BI-RADS CATEGORY  2: Benign. Electronically Signed   By: Lajean Manes M.D.   On: 05/05/2020 10:39     Assessment & Plan:   Dyspnea on exertion - Patient has been experiencing shortness of breath on exertion for last several years. Overall, symptoms are very mild. Pulmonary function testing today showed no evidence of restriction or obstruction. Diffusion capacity was normal. FENO was normal. PET scan in June showed no consolidation or pulmonary nodules and patent airways.  No identifiable pulmonary cause of dyspnea. Recommend cardiology evaluation d/t hx 3 vessel CAD and dyspnea. FU in 6 months   Upper airway cough syndrome - Likely triggered by PND/allergic rhinitis - Recommend daily antihistamine and flonase nasal spray     Martyn Ehrich, NP 05/28/2020

## 2020-05-28 ENCOUNTER — Other Ambulatory Visit: Payer: Self-pay

## 2020-05-28 ENCOUNTER — Ambulatory Visit (INDEPENDENT_AMBULATORY_CARE_PROVIDER_SITE_OTHER): Payer: PPO | Admitting: Primary Care

## 2020-05-28 ENCOUNTER — Ambulatory Visit (INDEPENDENT_AMBULATORY_CARE_PROVIDER_SITE_OTHER): Payer: PPO | Admitting: Pulmonary Disease

## 2020-05-28 VITALS — BP 104/68 | HR 97 | Ht <= 58 in | Wt 130.4 lb

## 2020-05-28 DIAGNOSIS — R058 Other specified cough: Secondary | ICD-10-CM | POA: Diagnosis not present

## 2020-05-28 DIAGNOSIS — R0600 Dyspnea, unspecified: Secondary | ICD-10-CM | POA: Diagnosis not present

## 2020-05-28 DIAGNOSIS — R0609 Other forms of dyspnea: Secondary | ICD-10-CM

## 2020-05-28 DIAGNOSIS — R059 Cough, unspecified: Secondary | ICD-10-CM

## 2020-05-28 DIAGNOSIS — R0602 Shortness of breath: Secondary | ICD-10-CM

## 2020-05-28 LAB — PULMONARY FUNCTION TEST
DL/VA % pred: 101 %
DL/VA: 4.38 ml/min/mmHg/L
DLCO cor % pred: 86 %
DLCO cor: 13.23 ml/min/mmHg
DLCO unc % pred: 82 %
DLCO unc: 12.63 ml/min/mmHg
FEF 25-75 Post: 2.25 L/sec
FEF 25-75 Pre: 2.36 L/sec
FEF2575-%Change-Post: -4 %
FEF2575-%Pred-Post: 212 %
FEF2575-%Pred-Pre: 222 %
FEV1-%Change-Post: -1 %
FEV1-%Pred-Post: 132 %
FEV1-%Pred-Pre: 135 %
FEV1-Post: 1.54 L
FEV1-Pre: 1.57 L
FEV1FVC-%Change-Post: -7 %
FEV1FVC-%Pred-Pre: 119 %
FEV6-%Change-Post: 5 %
FEV6-%Pred-Post: 125 %
FEV6-%Pred-Pre: 119 %
FEV6-Post: 1.81 L
FEV6-Pre: 1.72 L
FEV6FVC-%Change-Post: 0 %
FEV6FVC-%Pred-Post: 106 %
FEV6FVC-%Pred-Pre: 106 %
FVC-%Change-Post: 6 %
FVC-%Pred-Post: 119 %
FVC-%Pred-Pre: 112 %
FVC-Post: 1.83 L
FVC-Pre: 1.72 L
Post FEV1/FVC ratio: 85 %
Post FEV6/FVC ratio: 100 %
Pre FEV1/FVC ratio: 91 %
Pre FEV6/FVC Ratio: 100 %
RV % pred: 80 %
RV: 1.61 L
TLC % pred: 81 %
TLC: 3.38 L

## 2020-05-28 LAB — POCT EXHALED NITRIC OXIDE: FeNO level (ppb): 21

## 2020-05-28 NOTE — Assessment & Plan Note (Addendum)
-   Patient has been experiencing shortness of breath on exertion for last several years. Overall, symptoms are very mild. Pulmonary function testing today showed no evidence of restriction or obstruction. Diffusion capacity was normal. FENO was normal. PET scan in June showed no consolidation or pulmonary nodules and patent airways.  No identifiable pulmonary cause of dyspnea. Recommend cardiology evaluation d/t hx 3 vessel CAD and dyspnea. FU in 6 months

## 2020-05-28 NOTE — Progress Notes (Signed)
PFT done today. 

## 2020-05-28 NOTE — Assessment & Plan Note (Addendum)
-   Likely triggered by PND/allergic rhinitis - Recommend daily antihistamine and flonase nasal spray

## 2020-05-28 NOTE — Patient Instructions (Addendum)
Pulmonary function testing appears normal. No evidence of asthma or COPD. FENO was normal.   Recommendations: Start over counter antihistamine such as Claritin, Zyrtec or Xyzal  Start flonase nasal spray daily Use mucinex twice daily as needed for chest congestion  Referral: Cardiology re:  CAD, dyspnea on exertion  Follow-up 6 months with Dr. Loanne Drilling

## 2020-06-03 ENCOUNTER — Ambulatory Visit: Payer: PPO | Attending: Internal Medicine

## 2020-06-03 DIAGNOSIS — Z23 Encounter for immunization: Secondary | ICD-10-CM

## 2020-06-03 NOTE — Progress Notes (Signed)
   Covid-19 Vaccination Clinic  Name:  KAYLAANN MOUNTZ    MRN: [id number] DOB: 1940-11-01  06/03/2020  Ms. Ekblad was observed post Covid-19 immunization for 15 minutes without incident. She was provided with Vaccine Information Sheet and instruction to access the V-Safe system.   Ms. Cotugno was instructed to call 911 with any severe reactions post vaccine: [name] Kitchen Difficulty breathing  . Swelling of face and throat  . A fast heartbeat  . A bad rash all over body  . Dizziness and weakness

## 2020-07-10 IMAGING — DX DG ABDOMEN 2V
2 series · 2 of 2 positions shown · non-contrast
Comparison: CT abdomen and pelvis 11/11/2016

CLINICAL DATA: Diffuse abdominal pain for 1 week, history of
lymphoma

EXAM:
ABDOMEN - 2 VIEW

[abdomen erect]
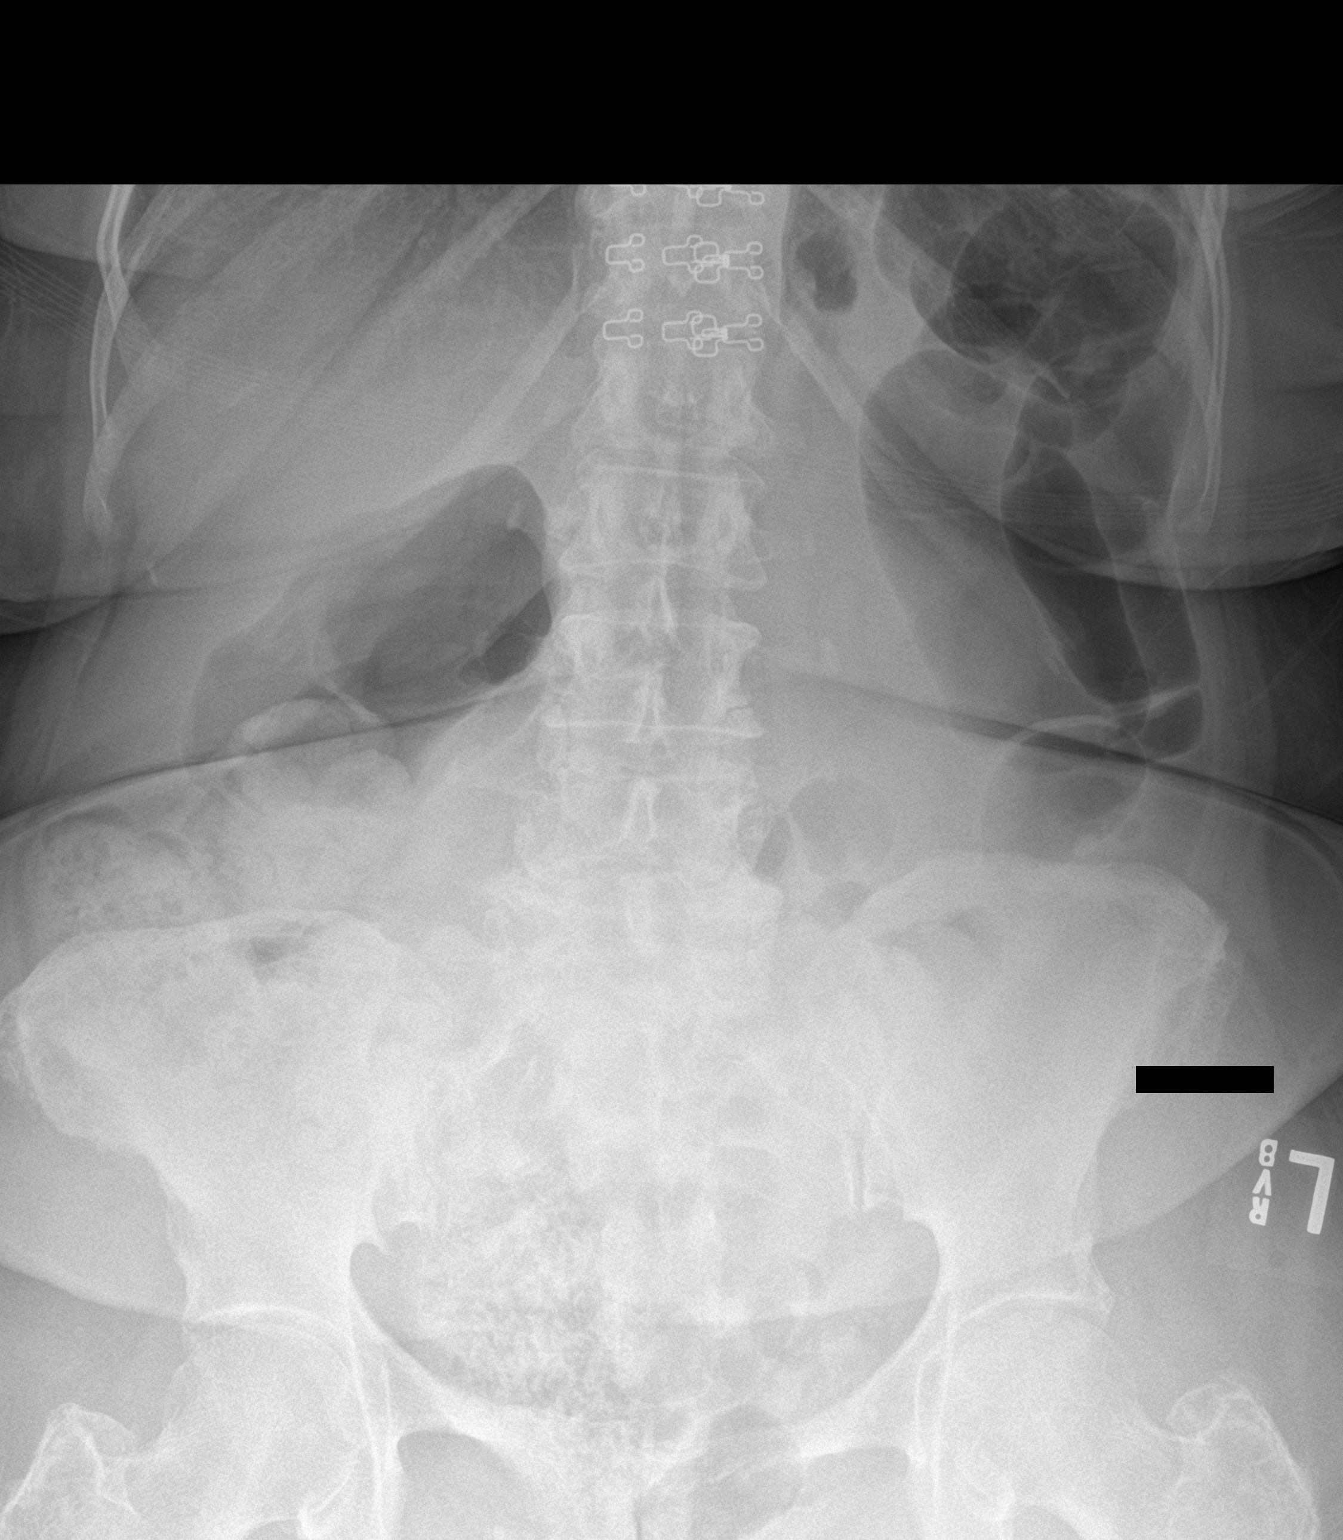

[abdomen supine]
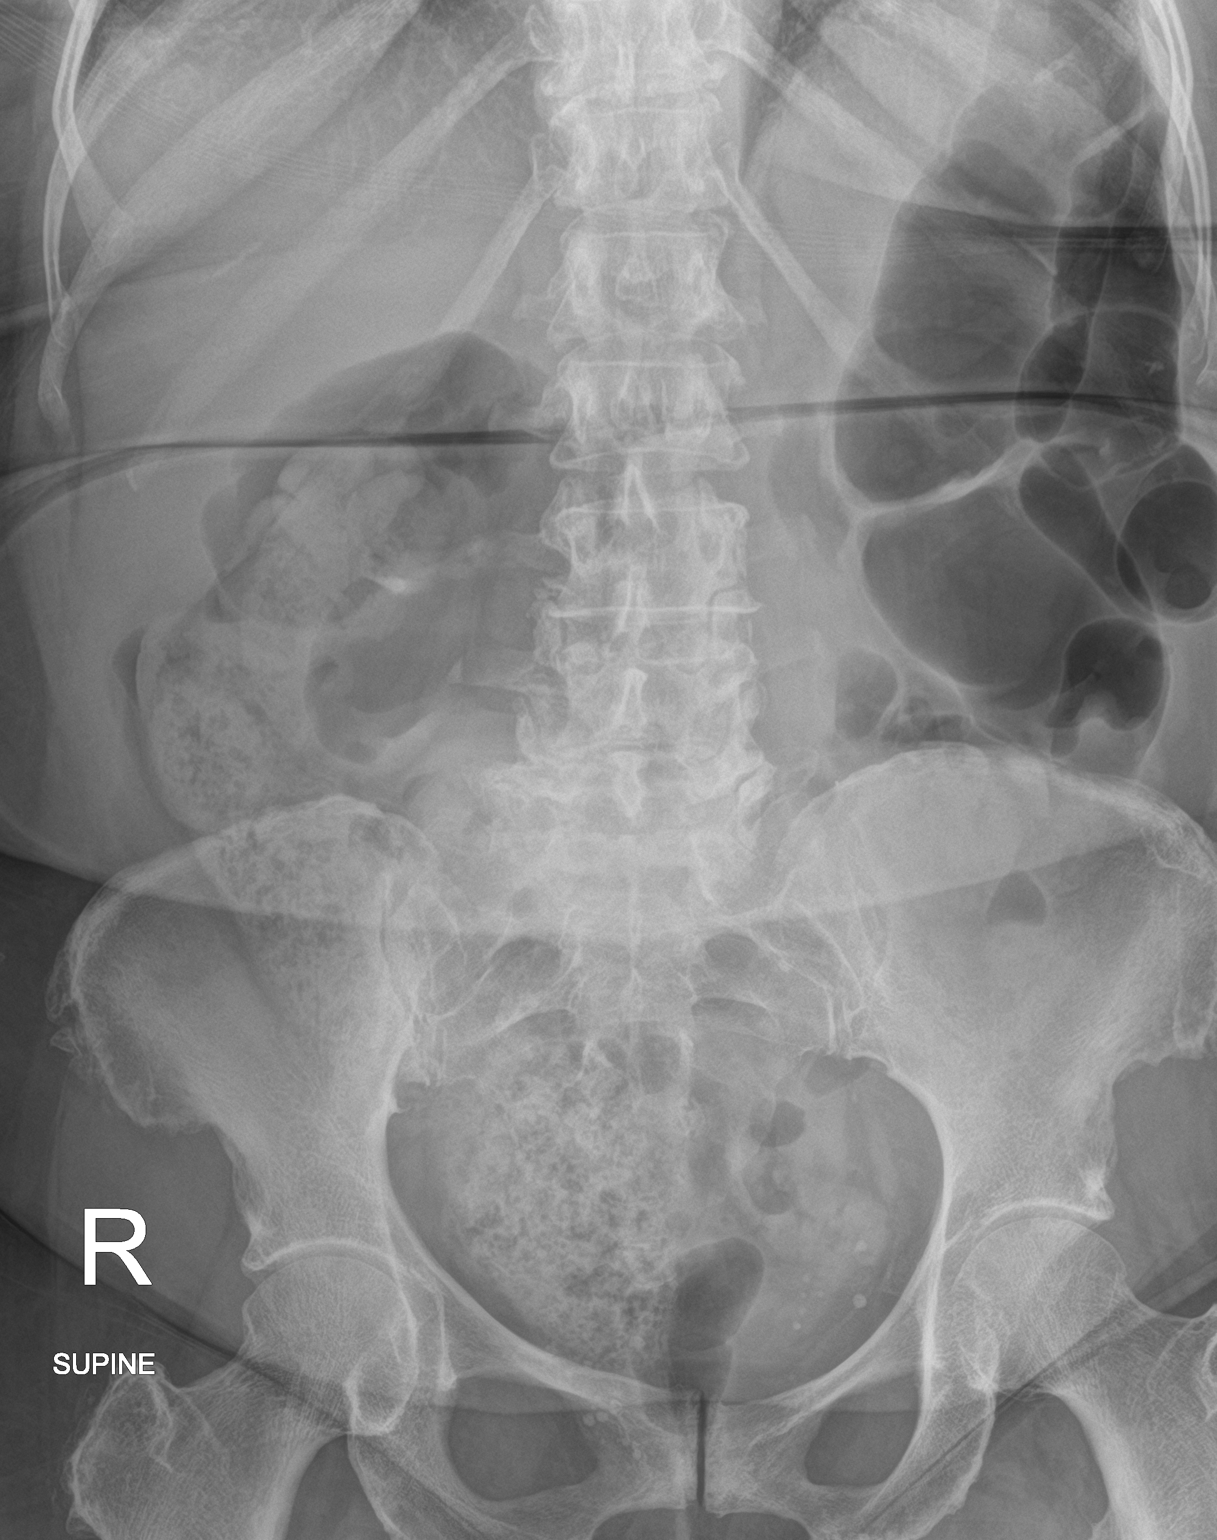

[2 of 2 positions shown; findings below may reference images not displayed]

FINDINGS: Scattered stool and gas throughout colon.

Nonobstructive bowel gas pattern.

No bowel wall thickening, bowel dilatation or free air.

Numerous pelvic phleboliths.

Degenerative disc and facet disease changes of the lumbar spine.
IMPRESSION: Nonobstructive bowel gas pattern.

## 2020-07-14 DIAGNOSIS — R42 Dizziness and giddiness: Secondary | ICD-10-CM | POA: Diagnosis not present

## 2020-07-14 DIAGNOSIS — E876 Hypokalemia: Secondary | ICD-10-CM | POA: Diagnosis not present

## 2020-07-14 DIAGNOSIS — N390 Urinary tract infection, site not specified: Secondary | ICD-10-CM | POA: Diagnosis not present

## 2020-08-20 ENCOUNTER — Telehealth: Payer: Self-pay | Admitting: Hematology and Oncology

## 2020-08-20 NOTE — Telephone Encounter (Signed)
R/s appt per 12/29 sch msg - left message for patient with appt date and time

## 2020-08-25 ENCOUNTER — Telehealth: Payer: Self-pay | Admitting: *Deleted

## 2020-08-25 DIAGNOSIS — J22 Unspecified acute lower respiratory infection: Secondary | ICD-10-CM | POA: Diagnosis not present

## 2020-08-25 DIAGNOSIS — U071 COVID-19: Secondary | ICD-10-CM | POA: Diagnosis not present

## 2020-08-25 NOTE — Telephone Encounter (Signed)
TCT from [facility] in Radiology. She states that pt called to cancel her scan for today, 08/25/20 due to the weather. Will have Radiology scheduling re-schedule this scan.  Dr. Leonides Schanz: Pt has appt with you on 08/27/20. Do you want to keep or re-schedule after her scan?

## 2020-08-25 NOTE — Telephone Encounter (Signed)
Received vm message from patient regarding her appts for today. She also states that she has a cough and a sore throat and is going to go get tested for Covid. TCT to patient, vm message left for her Advised her to let us know the results of her covid test and provided the number for her to call [facility] scheduling to re-schedule her CT scan

## 2020-08-26 ENCOUNTER — Inpatient Hospital Stay: Payer: PPO | Attending: Hematology and Oncology

## 2020-08-26 ENCOUNTER — Ambulatory Visit (HOSPITAL_COMMUNITY): Payer: PPO

## 2020-08-26 ENCOUNTER — Other Ambulatory Visit: Payer: Self-pay | Admitting: *Deleted

## 2020-08-26 DIAGNOSIS — Z90710 Acquired absence of both cervix and uterus: Secondary | ICD-10-CM | POA: Insufficient documentation

## 2020-08-26 DIAGNOSIS — E876 Hypokalemia: Secondary | ICD-10-CM | POA: Insufficient documentation

## 2020-08-26 DIAGNOSIS — Z87891 Personal history of nicotine dependence: Secondary | ICD-10-CM | POA: Insufficient documentation

## 2020-08-26 DIAGNOSIS — C8331 Diffuse large B-cell lymphoma, lymph nodes of head, face, and neck: Secondary | ICD-10-CM | POA: Insufficient documentation

## 2020-08-27 ENCOUNTER — Other Ambulatory Visit: Payer: PPO

## 2020-08-27 ENCOUNTER — Other Ambulatory Visit: Payer: Self-pay | Admitting: *Deleted

## 2020-08-27 ENCOUNTER — Telehealth: Payer: Self-pay | Admitting: Infectious Diseases

## 2020-08-27 ENCOUNTER — Telehealth: Payer: Self-pay | Admitting: *Deleted

## 2020-08-27 ENCOUNTER — Inpatient Hospital Stay: Payer: PPO | Admitting: Hematology and Oncology

## 2020-08-27 ENCOUNTER — Telehealth (HOSPITAL_COMMUNITY): Payer: Self-pay | Admitting: Pharmacist

## 2020-08-27 MED ORDER — NIRMATRELVIR/RITONAVIR (PAXLOVID)TABLET: 3.0000 | ORAL_TABLET | Freq: Two times a day (BID) | ORAL | 0 refills | Status: DC

## 2020-08-27 MED ORDER — NIRMATRELVIR/RITONAVIR (PAXLOVID)TABLET: 3.0000 | ORAL_TABLET | Freq: Two times a day (BID) | ORAL | 0 refills | Status: AC

## 2020-08-27 MED FILL — PAXLOVID 20 X 150 MG & 10 X: 20 X 150 MG | 5 days supply | Qty: 30 | Fill #0

## 2020-08-27 NOTE — Addendum Note (Signed)
Addended by: Blanchard Kelch on: 08/27/2020 02:48 PM   Modules accepted: Orders

## 2020-08-27 NOTE — Telephone Encounter (Addendum)
Called to discuss with patient about COVID-19 symptoms and the use of one of the available treatments for those with mild to moderate Covid symptoms and at a high risk of hospitalization.  Pt appears to qualify for outpatient treatment due to co-morbid conditions and/or a member of an at-risk group in accordance with the FDA Emergency Use Authorization.    Symptom onset:  Vaccinated: yes and boosted - however B cell dysfunction noted  Qualifiers: b cell lymphoma in remission currently, age > 26, SVI score 4   Unable to reach pt - LVM and sent MyChart   She would be a great Paxlovid candidate if we can dispense and start treatment today. If not can offer sotrovimab tomorrow pending availabitily.    CrCl calculated @ 61 - 65 the last 12 months of labs.  Would give full dose.  Only potential low grade interaction is with Remus Loffler, which we can ask her to hold    Rexene Alberts, NP    2:25 PM She returned my call - feels like symptoms started Sunday 08/24/2020

## 2020-08-27 NOTE — Telephone Encounter (Signed)
TCT patient to check on her status. Pt had developed URI over the weekend and has seen her PCP on 08/25/20 for covid test. Her test is positive.  She remains symptomatic today with cough, sore throat and fatigue. Advised that we will re-schedule her CT scan and MD appt for 21 days from now as is the policy here. She voiced understanding.  Dr. Leonides Schanz made aware.

## 2020-08-27 NOTE — Telephone Encounter (Signed)
Outpatient Oral COVID Treatment Note  I connected with Elissa Hefty on 08/27/2020/2:37 PM by telephone and verified that I am speaking with the correct person using two identifiers.  I discussed the limitations, risks, security, and privacy concerns of performing an evaluation and management service by telephone and the availability of in person appointments. I also discussed with the patient that there may be a patient responsible charge related to this service. The patient expressed understanding and agreed to proceed.  Patient location: [place] residence  Provider location: [facility]    Diagnosis: COVID-19 infection  Purpose of visit: Discussion of potential use of Molnupiravir or Paxlovid, a new treatment for mild to moderate COVID-19 viral infection in non-hospitalized patients.   Subjective: Patient is a 80 y.o. female who has been diagnosed with COVID 19 viral infection.  Their symptoms began on 08/24/2020 with malaise and nasal congestion.     Past Medical History:  Diagnosis Date  . Anemia   . Arthralgia   . Arthritis   . Dyspnea    increased exertion  . Elevated cholesterol   . History of radiation therapy 08/20/19- 09/24/19   lower left neck 25 fractions of 1.8 Gy to total 45 Gy.   . Insomnia   . Lymphoma ([place])   . Neck mass    left    Allergies  Allergen Reactions  . Codeine Itching  . Sulfa Antibiotics Other (See Comments)    Childhood allergy    Outpatient Medications Prior to Visit  Medication Sig Dispense Refill  . acetaminophen (TYLENOL) 325 MG tablet Take 650 mg by mouth every 6 (six) hours as needed for mild pain or headache.     . Azelastine HCl 0.15 % SOLN     . calcium carbonate (TUMS - DOSED IN MG ELEMENTAL CALCIUM) 500 MG chewable tablet 2 tablets    . celecoxib (CELEBREX) 200 MG capsule Take 200 mg by mouth every other day.    . cetirizine (ZYRTEC) 10 MG tablet Take 10 mg by mouth daily.    . montelukast (SINGULAIR) 10 MG tablet Take 10 mg by  mouth at bedtime.     . Multiple Vitamin (MULTIVITAMIN) tablet Take 1 tablet by mouth daily.    [name] Kitchen omeprazole (PRILOSEC) 40 MG capsule Take 40 mg by mouth daily.    [name] Kitchen saccharomyces boulardii (FLORASTOR) 250 MG capsule Take 250 mg by mouth 2 (two) times daily.    [name] Kitchen albuterol (VENTOLIN HFA) 108 (90 Base) MCG/ACT inhaler Inhale 2 puffs into the lungs every 6 (six) hours as needed. (Patient not taking: Reported on 08/27/2020)    . Chlorphen-PE-Acetaminophen (NOREL AD) 4-10-325 MG TABS 1 tablet as needed (Patient not taking: Reported on 08/27/2020)    . Omega 3 1000 MG CAPS 1 capsule (Patient not taking: Reported on 08/27/2020)    . Pediatric Multivitamins-Fl (MULTIVITAMINS/FL PO) 1 tablet (Patient not taking: Reported on 08/27/2020)    . potassium chloride SA (KLOR-CON) 20 MEQ tablet Take 1 tablet (20 mEq total) by mouth daily for 10 doses. 10 tablet 0  . zolpidem (AMBIEN) 10 MG tablet Take 5-10 mg by mouth at bedtime as needed. (Patient not taking: Reported on 08/27/2020)     No facility-administered medications prior to visit.    Objective: Patient appears/sounds pretty.  They are in no apparent distress.  Breathing is non labored.  Mood and behavior are normal.   Laboratory Data:  No results found for this or any previous visit (from the past 2160 hour(s)).  Assessment: 80 y.o. female with mild/moderate COVID 19 viral infection diagnosed on 08/26/2020 at high risk for progression to severe COVID 19.   Plan:  This patient is a 80 y.o. female that meets the following criteria for Emergency Use Authorization of: Paxlovid 1. Age >12 yr AND > 40 kg 2. SARS-COV-2 positive test 3. Symptom onset < 5 days 4. Mild-to-moderate COVID disease with high risk for severe progression to hospitalization or death  I have spoken and communicated the following to the patient or parent/caregiver regarding: 1. Paxlovid is an unapproved drug that is authorized for use under an Emergency Use Authorization.  2. There are  no adequate, approved, available products for the treatment of COVID-19 in adults who have mild-to-moderate COVID-19 and are at high risk for progressing to severe COVID-19, including hospitalization or death. 3. Other therapeutics are currently authorized. For additional information on all products authorized for treatment or prevention of COVID-19, please see [email or web address]://[email or web address]-[email or web address]/.  4. There are benefits and risks of taking this treatment as outlined in the "Fact Sheet for Patients and Caregivers."  5. "Fact Sheet for Patients and Caregivers" was reviewed with patient. A hard copy will be provided to patient from pharmacy prior to the patient receiving treatment. 6. Patients should continue to self-isolate and use infection control measures (e.g., wear mask, isolate, social distance, avoid sharing personal items, clean and disinfect "high touch" surfaces, and frequent handwashing) according to CDC guidelines.  7. The patient or parent/caregiver has the option to accept or refuse treatment. 8. Patient medication history was reviewed for potential drug interactions:Interaction with home meds: Ambien - she has not taken this in some time. I asked her to hold off on taking this until she completes.  9. Patient's creatinine clearance was calculated to be 61 - 65 mL/min, and they were therefore prescribed Normal dose (CrCl>60) - nirmatrelvir 150mg  tab (2 tablet) by mouth twice daily AND ritonavir 100mg  tab (1 tablet) by mouth twice daily   After reviewing above information with the patient, the patient agrees to receive Paxlovid.  Follow up instructions:    . Take prescription BID x 5 days as directed . Reach out to pharmacist for counseling on medication if desired . For concerns regarding further COVID symptoms please follow up with your PCP or urgent care . For urgent or life-threatening issues, seek  care at your local emergency department  The patient was provided an opportunity to ask questions, and all were answered. The patient agreed with the plan and demonstrated an understanding of the instructions.   Script sent to [facility] and opted to pick up RX.  The patient was advised to call their PCP or seek an in-person evaluation if the symptoms worsen or if the condition fails to improve as anticipated.     I provided 22 minutes of non face-to-face telephone visit time during this encounter, and > 50% was spent counseling as documented under my assessment & plan.  , NP 08/27/2020 /2:37 PM

## 2020-08-27 NOTE — Telephone Encounter (Signed)
Patient was prescribed oral covid treatment paxlovid and treatment note was reviewed. Medication has been received by [facility] and reviewed for appropriateness.  Drug Interactions or Dosage Adjustments Noted: Patient was told to hold zolpidem during therapy.  CrCl was calculated above 17ml/min, normal dosing  Delivery Method:patient pick up  Patient contacted for counseling on 08/27/2020 and verbalized understanding.   Delivery or Pick-Up Date: 08/27/2020   Larrie Kass 08/27/2020, 3:16 PM [facility] Pharmacist Phone# [phone]

## 2020-08-28 ENCOUNTER — Telehealth: Payer: Self-pay | Admitting: Hematology and Oncology

## 2020-08-28 NOTE — Telephone Encounter (Signed)
Scheduled appt per 1/5 sch msg - pt is aware of appt date and time   

## 2020-09-16 ENCOUNTER — Inpatient Hospital Stay: Payer: PPO

## 2020-09-16 ENCOUNTER — Other Ambulatory Visit: Payer: Self-pay

## 2020-09-16 ENCOUNTER — Ambulatory Visit (HOSPITAL_COMMUNITY)
Admission: RE | Admit: 2020-09-16 | Discharge: 2020-09-16 | Disposition: A | Discharge status: Home / Self Care | Payer: PPO | Source: Ambulatory Visit | Attending: Hematology and Oncology | Admitting: Hematology and Oncology

## 2020-09-16 ENCOUNTER — Other Ambulatory Visit: Payer: Self-pay | Admitting: *Deleted

## 2020-09-16 DIAGNOSIS — Z90710 Acquired absence of both cervix and uterus: Secondary | ICD-10-CM | POA: Diagnosis not present

## 2020-09-16 DIAGNOSIS — K5730 Diverticulosis of large intestine without perforation or abscess without bleeding: Secondary | ICD-10-CM | POA: Insufficient documentation

## 2020-09-16 DIAGNOSIS — C8331 Diffuse large B-cell lymphoma, lymph nodes of head, face, and neck: Secondary | ICD-10-CM | POA: Insufficient documentation

## 2020-09-16 DIAGNOSIS — I2510 Atherosclerotic heart disease of native coronary artery without angina pectoris: Secondary | ICD-10-CM | POA: Diagnosis not present

## 2020-09-16 DIAGNOSIS — K760 Fatty (change of) liver, not elsewhere classified: Secondary | ICD-10-CM | POA: Diagnosis not present

## 2020-09-16 DIAGNOSIS — E876 Hypokalemia: Secondary | ICD-10-CM | POA: Diagnosis not present

## 2020-09-16 DIAGNOSIS — R918 Other nonspecific abnormal finding of lung field: Secondary | ICD-10-CM | POA: Diagnosis not present

## 2020-09-16 DIAGNOSIS — I700 Atherosclerosis of aorta: Secondary | ICD-10-CM | POA: Insufficient documentation

## 2020-09-16 DIAGNOSIS — C8590 Non-Hodgkin lymphoma, unspecified, unspecified site: Secondary | ICD-10-CM | POA: Diagnosis not present

## 2020-09-16 DIAGNOSIS — Z87891 Personal history of nicotine dependence: Secondary | ICD-10-CM | POA: Diagnosis not present

## 2020-09-16 LAB — CMP (CANCER CENTER ONLY)
ALT: 18 U/L (ref 0–44)
AST: 18 U/L (ref 15–41)
Albumin: 3.7 g/dL (ref 3.5–5.0)
Alkaline Phosphatase: 100 U/L (ref 38–126)
Anion gap: 8 (ref 5–15)
BUN: 11 mg/dL (ref 8–23)
CO2: 27 mmol/L (ref 22–32)
Calcium: 9.1 mg/dL (ref 8.9–10.3)
Chloride: 106 mmol/L (ref 98–111)
Creatinine: 0.76 mg/dL (ref 0.44–1.00)
GFR, Estimated: >60 mL/min (ref >60)
Glucose, Bld: 90 mg/dL (ref 70–99)
Potassium: 4 mmol/L (ref 3.5–5.1)
Sodium: 141 mmol/L (ref 135–145)
Total Bilirubin: 0.4 mg/dL (ref 0.3–1.2)
Total Protein: 7.2 g/dL (ref 6.5–8.1)

## 2020-09-16 LAB — CBC WITH DIFFERENTIAL (CANCER CENTER ONLY)
Abs Immature Granulocytes: 0.02 10*3/uL (ref 0.00–0.07)
Basophils Absolute: 0 10*3/uL (ref 0.0–0.1)
Basophils Relative: 0 %
Eosinophils Absolute: 0.2 10*3/uL (ref 0.0–0.5)
Eosinophils Relative: 4 %
HCT: 41.1 % (ref 36.0–46.0)
Hemoglobin: 13 g/dL (ref 12.0–15.0)
Immature Granulocytes: 0 %
Lymphocytes Relative: 35 %
Lymphs Abs: 1.9 10*3/uL (ref 0.7–4.0)
MCH: 26.9 pg (ref 26.0–34.0)
MCHC: 31.6 g/dL (ref 30.0–36.0)
MCV: 84.9 fL (ref 80.0–100.0)
Monocytes Absolute: 0.7 10*3/uL (ref 0.1–1.0)
Monocytes Relative: 12 %
Neutro Abs: 2.7 10*3/uL (ref 1.7–7.7)
Neutrophils Relative %: 49 %
Platelet Count: 292 10*3/uL (ref 150–400)
RBC: 4.84 MIL/uL (ref 3.87–5.11)
RDW: 15.4 % (ref 11.5–15.5)
WBC Count: 5.6 10*3/uL (ref 4.0–10.5)
nRBC: 0 % (ref 0.0–0.2)

## 2020-09-16 LAB — TSH: TSH: 1.336 u[IU]/mL (ref 0.308–3.960)

## 2020-09-16 MED ORDER — IOHEXOL 300 MG/ML  SOLN
100.0000 mL | Freq: Once | INTRAMUSCULAR | Status: AC | PRN
  Administered 2020-09-16: 100 mL via INTRAVENOUS

## 2020-09-18 ENCOUNTER — Other Ambulatory Visit: Payer: Self-pay

## 2020-09-18 ENCOUNTER — Encounter: Payer: Self-pay | Admitting: Hematology and Oncology

## 2020-09-18 ENCOUNTER — Inpatient Hospital Stay (HOSPITAL_BASED_OUTPATIENT_CLINIC_OR_DEPARTMENT_OTHER): Payer: PPO | Admitting: Hematology and Oncology

## 2020-09-18 VITALS — BP 117/74 | HR 86 | Temp 98.9°F | Resp 17 | Ht <= 58 in | Wt 138.9 lb

## 2020-09-18 DIAGNOSIS — C8331 Diffuse large B-cell lymphoma, lymph nodes of head, face, and neck: Secondary | ICD-10-CM | POA: Diagnosis not present

## 2020-09-18 NOTE — Progress Notes (Signed)
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 BCL2 rearrangement positive, no BCL6 or Myc rearrangement -03/2019: left supraclavicular (bulky), left axillary, RP, mesenteric and iliac adenopathy on PET; bone marrow bx negative for lymphoma  -Mid-03/2019 - late 06/2019: R-CHOP with G-CSF support  ? EOT PET showed decreased but residual left thoracic inlet LN disease ([place] 5); resolution of other sites of disease  -Late 07/2019 - 09/2019: ISRT, 45 Gy/25 fractions  ? Interim PET showed resolving FDG avidity in the LN's in the thoracic inlet ([place] 3); no new disease  ? 02/12/2020: repeat PET CT shows residual FDG avidity in the LN's in the thoracic inlet ([place] 3); no new disease  2. Port placed in 03/2019  TREATMENT REGIMEN:  04/04/2019 - 07/20/2019: R-CHOP with Udenyca x 6 cycles  08/20/2019 - 09/24/2019: ISRT, 45 Gy/25 fractions; completed on 09/24/2019   Interval History:  Sandra Payne 80 y.o. 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 The patient's last visit was on 05/15/2020. In the interim since the last visit she has had no hospitalizations or ED  visits.  On exam today Sandra Payne ports that she is recovering well from her Covid infection.  She notes that she is not quite back to 100%, but approximately out 60% back.  She reports that she feels quite drained and tired as result of the infection.  She has had a weight increase from 130 pounds to about 138.  She does that she is currently having some discomfort in her left shoulder and occasionally some cramping in her feet, but otherwise has been quite well.  She denies any fevers, chills, sweats, nausea, vomiting or diarrhea.  A full 10 point ROS is listed below.   MEDICAL HISTORY:  Past Medical History:  Diagnosis Date  . Anemia   . Arthralgia   . Arthritis   . Dyspnea    increased exertion  . Elevated cholesterol   . History of radiation therapy 08/20/19- 09/24/19   lower left neck 25 fractions of 1.8 Gy to total 45 Gy.   . Insomnia   . Lymphoma ([place])   . Neck mass    left    SURGICAL HISTORY: Past Surgical History:  Procedure Laterality Date  . ABDOMINAL HYSTERECTOMY     partial  . IR IMAGING GUIDED PORT INSERTION  03/28/2019  . IR REMOVAL TUN ACCESS W/ PORT W/O FL MOD SED  11/20/2019  . MASS BIOPSY Left 03/12/2019   Procedure: NECK MASS BIOPSY;  Surgeon: Izora Gala, MD;  Location: [facility];  Service: ENT;  Laterality: Left;  . TOTAL KNEE ARTHROPLASTY Right 01/06/2015   Procedure: RIGHT TOTAL KNEE ARTHROPLASTY;  Surgeon: Frederik Pear, MD;  Location: [facility];  Service: Orthopedics;  Laterality: Right;    SOCIAL HISTORY: Social History  Socioeconomic History  . Marital status: Widowed    Spouse name: Not on file  . Number of children: 1  . Years of education: Not on file  . Highest education level: Not on file  Occupational History  . Not on file  Tobacco Use  . Smoking status: Former Smoker    Packs/day: 0.25    Years: 2.00    Pack years: 0.50    Types: Cigarettes  . Smokeless tobacco: Never Used  . Tobacco comment: she quit 30-40 years ago.    Vaping Use  . Vaping Use: Never used  Substance and Sexual Activity  . Alcohol use: Yes    Alcohol/week: 0.0 standard drinks    Comment: occasionally  . Drug use: No  . Sexual activity: Not on file  Other Topics Concern  . Not on file  Social History Narrative   Lives alone in a 2 story home.  Husband passed away on 09/20/14.     Works one day a week at [employer].     Retired from [employer].  Exercises regularly.    Patient is right-handed.   Social Determinants of Health   Financial Resource Strain: Not on file  Food Insecurity: Not on file  Transportation Needs: Not on file  Physical Activity: Not on file  Stress: Not on file  Social Connections: Not on file  Intimate Partner Violence: Not on file    FAMILY HISTORY: Family History  Problem Relation Age of Onset  . Diabetes Mother   . Hypertension Mother   . Breast cancer Neg Hx     ALLERGIES:  is allergic to codeine, sulfa antibiotics, and tramadol hcl.  MEDICATIONS:  Current Outpatient Medications  Medication Sig Dispense Refill  . acetaminophen (TYLENOL) 325 MG tablet Take 650 mg by mouth every 6 (six) hours as needed for mild pain or headache.     . albuterol (VENTOLIN HFA) 108 (90 Base) MCG/ACT inhaler Inhale 2 puffs into the lungs every 6 (six) hours as needed. (Patient not taking: Reported on 08/27/2020)    . Azelastine HCl 0.15 % SOLN     . calcium carbonate (TUMS - DOSED IN MG ELEMENTAL CALCIUM) 500 MG chewable tablet 2 tablets    . celecoxib (CELEBREX) 200 MG capsule Take 200 mg by mouth every other day.    . cetirizine (ZYRTEC) 10 MG tablet Take 10 mg by mouth daily.    . Chlorphen-PE-Acetaminophen (NOREL AD) 4-10-325 MG TABS 1 tablet as needed (Patient not taking: Reported on 08/27/2020)    . montelukast (SINGULAIR) 10 MG tablet Take 10 mg by mouth at bedtime.     . Multiple Vitamin (MULTIVITAMIN) tablet Take 1 tablet by mouth daily.    . Omega 3 1000 MG CAPS 1  capsule (Patient not taking: Reported on 08/27/2020)    . omeprazole (PRILOSEC) 40 MG capsule Take 40 mg by mouth daily.    . Pediatric Multivitamins-Fl (MULTIVITAMINS/FL PO) 1 tablet (Patient not taking: Reported on 08/27/2020)    . saccharomyces boulardii (FLORASTOR) 250 MG capsule Take 250 mg by mouth 2 (two) times daily.    [name] Kitchen zolpidem (AMBIEN) 10 MG tablet Take 5-10 mg by mouth at bedtime as needed. (Patient not taking: Reported on 08/27/2020)     No current facility-administered medications for this visit.    REVIEW OF SYSTEMS:   Constitutional: ( - ) fevers, ( - )  chills , ( - ) night sweats Eyes: ( - ) blurriness  of vision, ( - ) double vision, ( - ) watery eyes Ears, nose, mouth, throat, and face: ( - ) mucositis, ( - ) sore throat Respiratory: ( - ) cough, ( - ) dyspnea, ( - ) wheezes Cardiovascular: ( - ) palpitation, ( - ) chest discomfort, ( - ) lower extremity swelling Gastrointestinal:  ( - ) nausea, ( - ) heartburn, ( - ) change in bowel habits Skin: ( - ) abnormal skin rashes Lymphatics: ( - ) new lymphadenopathy, ( - ) easy bruising Neurological: ( - ) numbness, ( - ) tingling, ( - ) new weaknesses Behavioral/Psych: ( - ) mood change, ( - ) new changes  All other systems were reviewed with the patient and are negative.  PHYSICAL EXAMINATION: ECOG PERFORMANCE STATUS: 1 - Symptomatic but completely ambulatory  Vitals:   09/18/20 1100  BP: 117/74  Pulse: 86  Resp: 17  Temp: 98.9 F (37.2 C)  SpO2: 100%   Filed Weights   09/18/20 1100  Weight: 138 lb 14.4 oz (63 kg)    GENERAL: well appearing elderly African American female. Alert, no distress and comfortable SKIN: skin color, texture, turgor are normal, no rashes or significant lesions EYES: conjunctiva are pink and non-injected, sclera clear LUNGS: clear to auscultation and percussion with normal breathing effort HEART: regular rate & rhythm and no murmurs and no lower extremity edema Musculoskeletal: no cyanosis  of digits and no clubbing  PSYCH: alert & oriented x 3, fluent speech NEURO: no focal motor/sensory deficits  LABORATORY DATA:  I have reviewed the data as listed CBC Latest Ref Rng & Units 09/16/2020 05/15/2020 02/13/2020  WBC 4.0 - 10.5 K/uL 5.6 5.7 5.2  Hemoglobin 12.0 - 15.0 g/dL 13.0 12.0 12.9  Hematocrit 36.0 - 46.0 % 41.1 37.5 40.2  Platelets 150 - 400 K/uL 292 271 307    CMP Latest Ref Rng & Units 09/16/2020 05/15/2020 02/13/2020  Glucose 70 - 99 mg/dL 90 114(H) 97  BUN 8 - 23 mg/dL '11 10 11  ' Creatinine 0.44 - 1.00 mg/dL 0.76 0.72 0.72  Sodium 135 - 145 mmol/L 141 139 140  Potassium 3.5 - 5.1 mmol/L 4.0 3.1(L) 4.1  Chloride 98 - 111 mmol/L 106 105 108  CO2 22 - 32 mmol/L '27 29 25  ' Calcium 8.9 - 10.3 mg/dL 9.1 9.2 9.2  Total Protein 6.5 - 8.1 g/dL 7.2 6.7 6.7  Total Bilirubin 0.3 - 1.2 mg/dL 0.4 0.6 0.5  Alkaline Phos 38 - 126 U/L 100 78 90  AST 15 - 41 U/L '18 20 19  ' ALT 0 - 44 U/L '18 18 19    ' RADIOGRAPHIC STUDIES: I have personally reviewed the radiological images as listed and agreed with the findings in the report: no evidence of residual lymphadenopathy.   CT CHEST ABDOMEN PELVIS W CONTRAST  Result Date: 09/16/2020 CLINICAL DATA:  Surveillance/follow-up scan for hematologic malignancy (lymphoma), treatment completed in March of 2021 EXAM: CT CHEST, ABDOMEN, AND PELVIS WITH CONTRAST TECHNIQUE: Multidetector CT imaging of the chest, abdomen and pelvis was performed following the standard protocol during bolus administration of intravenous contrast. CONTRAST:  167m OMNIPAQUE IOHEXOL 300 MG/ML  SOLN COMPARISON:  Head CT November 05, 2019 and February 12, 2020. FINDINGS: CT CHEST FINDINGS Cardiovascular: Aortic atherosclerosis. Coronary artery calcifications. Normal size heart. No pericardial thickening/effusion. Mediastinum/Nodes: There is no measurable abnormal soft tissue correspond with the previously indexed ill-defined soft tissue density at the left thoracic inlet. No mediastinal,  axillary or hilar lymphadenopathy. Thyroid gland  is unremarkable. Trachea and esophagus are within normal limits. Lungs/Pleura: Left lower lobe peripheral ground-glass opacities. No focal consolidation. No suspicious pulmonary nodule. No pleural effusion. No pneumothorax. Musculoskeletal: No suspicious lytic or blastic lesion of bone. CT ABDOMEN PELVIS FINDINGS Hepatobiliary: Mild diffuse hepatic steatosis. No suspicious hepatic lesion. Gallbladder is unremarkable. No biliary ductal dilatation. Pancreas: Unremarkable. No pancreatic ductal dilatation or surrounding inflammatory changes. Spleen: Normal in size without focal abnormality. Adrenals/Urinary Tract: Adrenal glands are unremarkable. Kidneys are normal, without renal calculi, focal lesion, or hydronephrosis. Bladder is decompressed limiting evaluation. Stomach/Bowel: Stomach is within normal limits. Appendix appears normal. Colonic diverticulosis. Moderate volume of formed stool throughout the colon. No evidence of bowel wall thickening, distention, or inflammatory changes. Vascular/Lymphatic: Aortic atherosclerosis. No enlarged abdominal or pelvic lymph nodes. Reproductive: Status post hysterectomy. No adnexal masses. Other: No abdominopelvic ascites. Musculoskeletal: Multilevel degenerative changes spine. No suspicious lytic or blastic lesion of bone. No acute osseous abnormality. IMPRESSION: 1. No measurable abnormal soft tissue to correspond with the previously indexed ill-defined soft tissue density at the left thoracic inlet. No lymphadenopathy within the chest, abdomen, or pelvis. 2. Mild diffuse hepatic steatosis. 3. Left lower lobe peripheral ground-glass opacities which likely infectious/inflammatory. 4. Colonic diverticulosis without evidence of diverticulitis. 5. Aortic atherosclerosis and coronary artery calcifications Aortic Atherosclerosis (ICD10-I70.0). Electronically Signed   By: Dahlia Bailiff MD   On: 09/16/2020 12:07    ASSESSMENT &  PLAN Sandra Payne 80 y.o. 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  An interval PET scan was performed on 02/12/2020 showed some residual FDG avidity within the left thoracic inlet, consistent with prior PET CT scan performed on 11/05/2019.  Given the stability of these images I would recommend that we continue to monitor clinically with a 45-monthfollow-up CT scan to assure no recurrence or spread.  Symptomatically the patient is doing quite well and is currently gaining back appetite and energy.  We will plan to have the patient return in 3 months time for continued clinical monitoring.   #Stage III DLBCL; BCL2 rearrangement+, poor risk by R-IPI -S/p 6 cycles of R-CHOP with G-CSF and ISRT for residual disease -I reviewed imaging results in detail with the patient -her last CT scan on 09/16/2020 remained stable and did not show any evidence of recurrent disease. She can be monitored clinically from here on out.  -given the neck radiation, she would benefit from periodic TSH monitoring for any radiation-induced hypothyroidism  --RTC in 3 months for clinical monitoring/labs.    No orders of the defined types were placed in this encounter.  All questions were answered. The patient knows to call the clinic with any problems, questions or concerns.  A total of more than 30 minutes were spent on this encounter and over half of that time was spent on counseling and coordination of care as outlined above.   JLedell Peoples MD Department of Hematology/Oncology [place] [facility] HospitalPhone: [phone]: [phone]: [email or web address]  09/18/2020 11:10 AM

## 2020-09-22 ENCOUNTER — Telehealth: Payer: Self-pay | Admitting: Hematology and Oncology

## 2020-09-22 NOTE — Telephone Encounter (Signed)
Scheduled per 1/27 los. Called and spoke with pt, confirmed 4/27 appts  

## 2020-09-30 DIAGNOSIS — R413 Other amnesia: Secondary | ICD-10-CM | POA: Diagnosis not present

## 2020-09-30 DIAGNOSIS — E782 Mixed hyperlipidemia: Secondary | ICD-10-CM | POA: Diagnosis not present

## 2020-09-30 DIAGNOSIS — G620 Drug-induced polyneuropathy: Secondary | ICD-10-CM | POA: Diagnosis not present

## 2020-11-17 DIAGNOSIS — Z0000 Encounter for general adult medical examination without abnormal findings: Secondary | ICD-10-CM | POA: Diagnosis not present

## 2020-11-17 DIAGNOSIS — M1990 Unspecified osteoarthritis, unspecified site: Secondary | ICD-10-CM | POA: Diagnosis not present

## 2020-11-17 DIAGNOSIS — E538 Deficiency of other specified B group vitamins: Secondary | ICD-10-CM | POA: Diagnosis not present

## 2020-11-17 DIAGNOSIS — E559 Vitamin D deficiency, unspecified: Secondary | ICD-10-CM | POA: Diagnosis not present

## 2020-11-17 DIAGNOSIS — G3184 Mild cognitive impairment, so stated: Secondary | ICD-10-CM | POA: Diagnosis not present

## 2020-11-17 DIAGNOSIS — E785 Hyperlipidemia, unspecified: Secondary | ICD-10-CM | POA: Diagnosis not present

## 2020-11-17 DIAGNOSIS — C8330 Diffuse large B-cell lymphoma, unspecified site: Secondary | ICD-10-CM | POA: Diagnosis not present

## 2020-11-17 DIAGNOSIS — I700 Atherosclerosis of aorta: Secondary | ICD-10-CM | POA: Diagnosis not present

## 2020-11-17 DIAGNOSIS — Z131 Encounter for screening for diabetes mellitus: Secondary | ICD-10-CM | POA: Diagnosis not present

## 2020-11-17 DIAGNOSIS — E782 Mixed hyperlipidemia: Secondary | ICD-10-CM | POA: Diagnosis not present

## 2020-11-17 DIAGNOSIS — M5090 Cervical disc disorder, unspecified, unspecified cervical region: Secondary | ICD-10-CM | POA: Diagnosis not present

## 2020-11-17 DIAGNOSIS — D692 Other nonthrombocytopenic purpura: Secondary | ICD-10-CM | POA: Diagnosis not present

## 2020-11-17 DIAGNOSIS — M4302 Spondylolysis, cervical region: Secondary | ICD-10-CM | POA: Diagnosis not present

## 2020-11-27 IMAGING — DX DG CERVICAL SPINE COMPLETE 4+V
5 series · 5 of 5 positions shown · non-contrast
Comparison: 08/31/2012

CLINICAL DATA: Chronic neck pain

EXAM:
CERVICAL SPINE - COMPLETE 4+ VIEW

[dg cervical spine complete (1 of 5)]
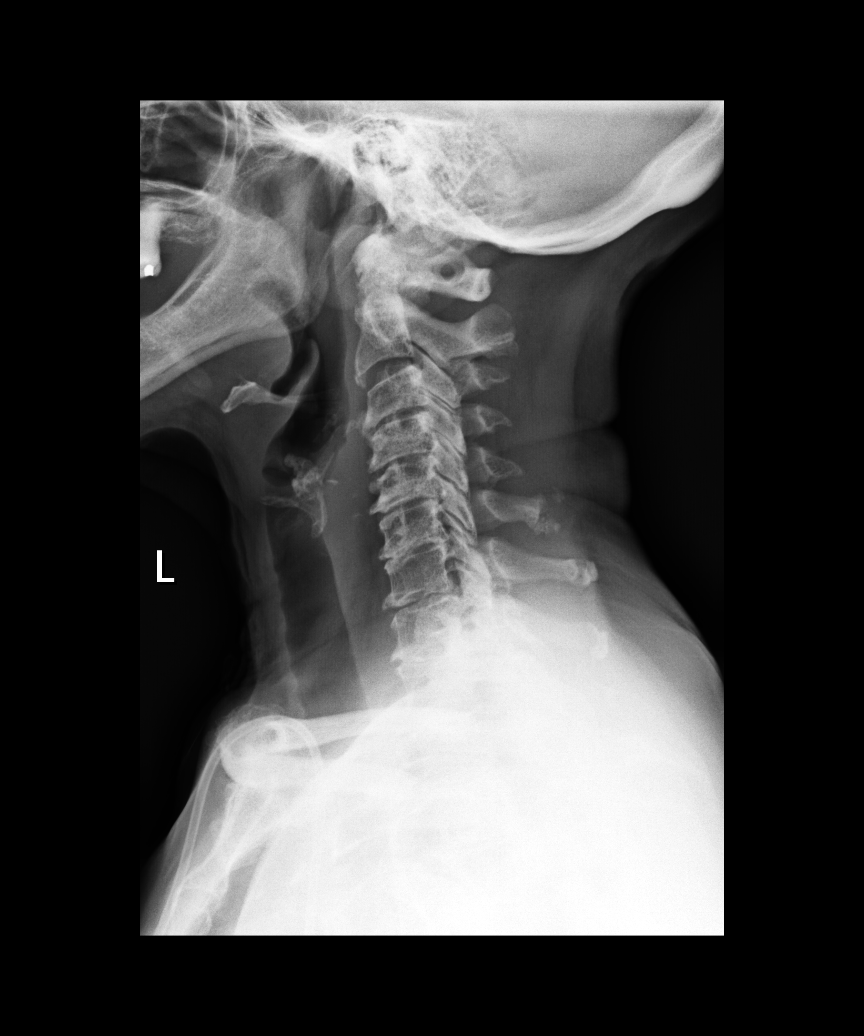

[dg cervical spine complete (2 of 5)]
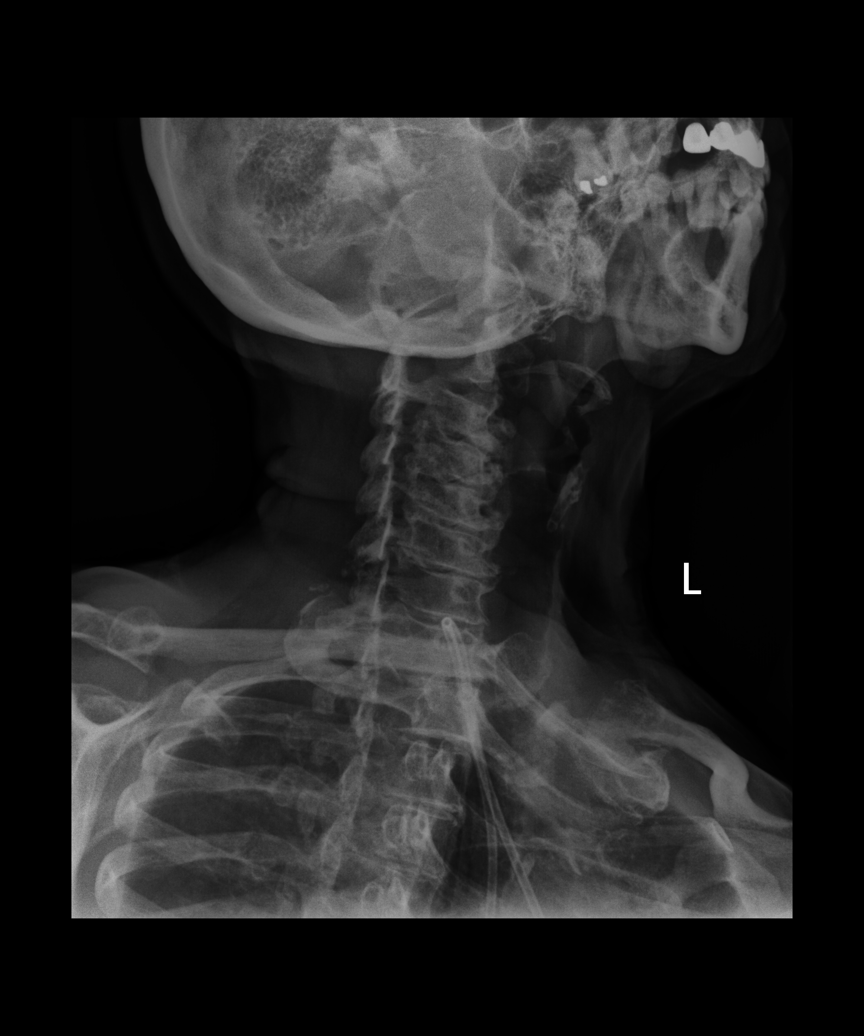

[dg cervical spine complete (3 of 5)]
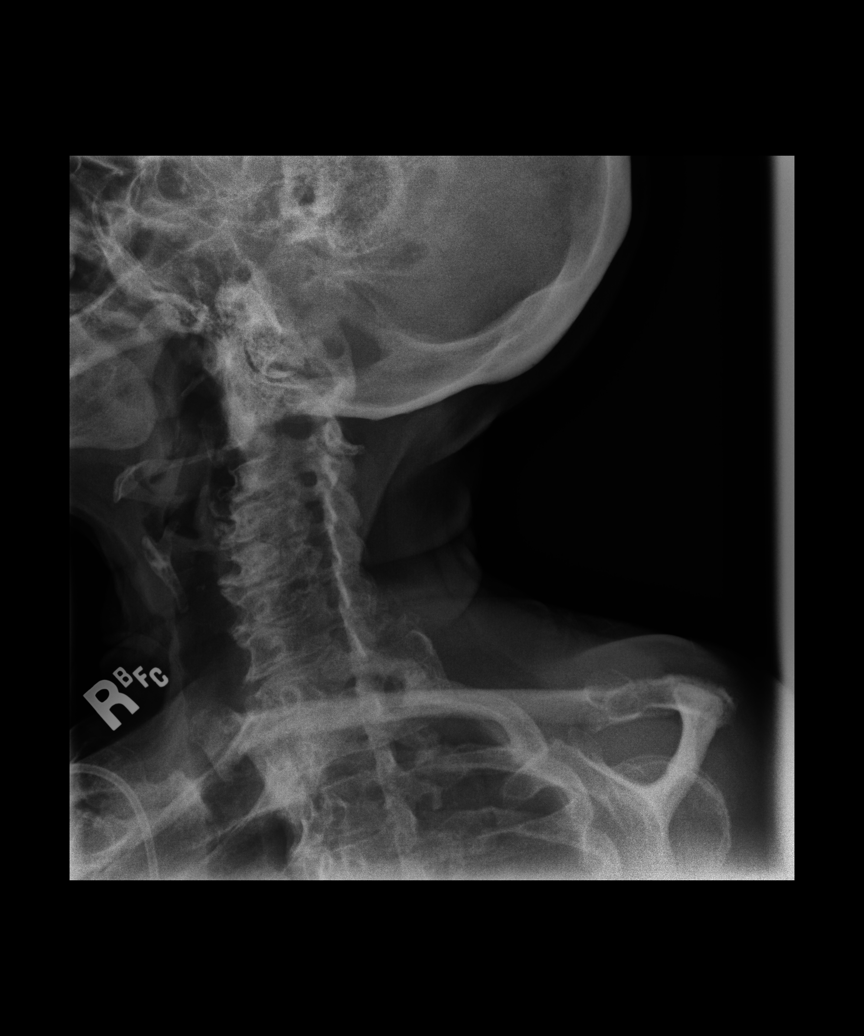

[dg cervical spine complete (4 of 5)]
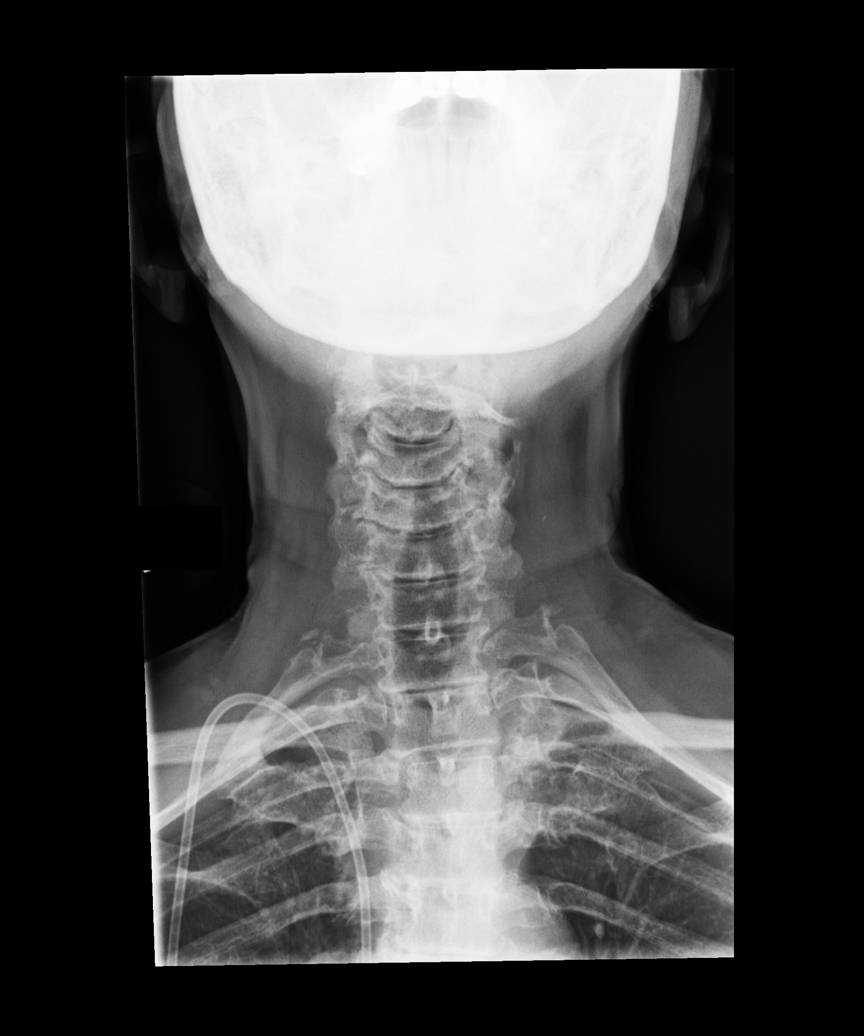

[dg cervical spine complete (5 of 5)]
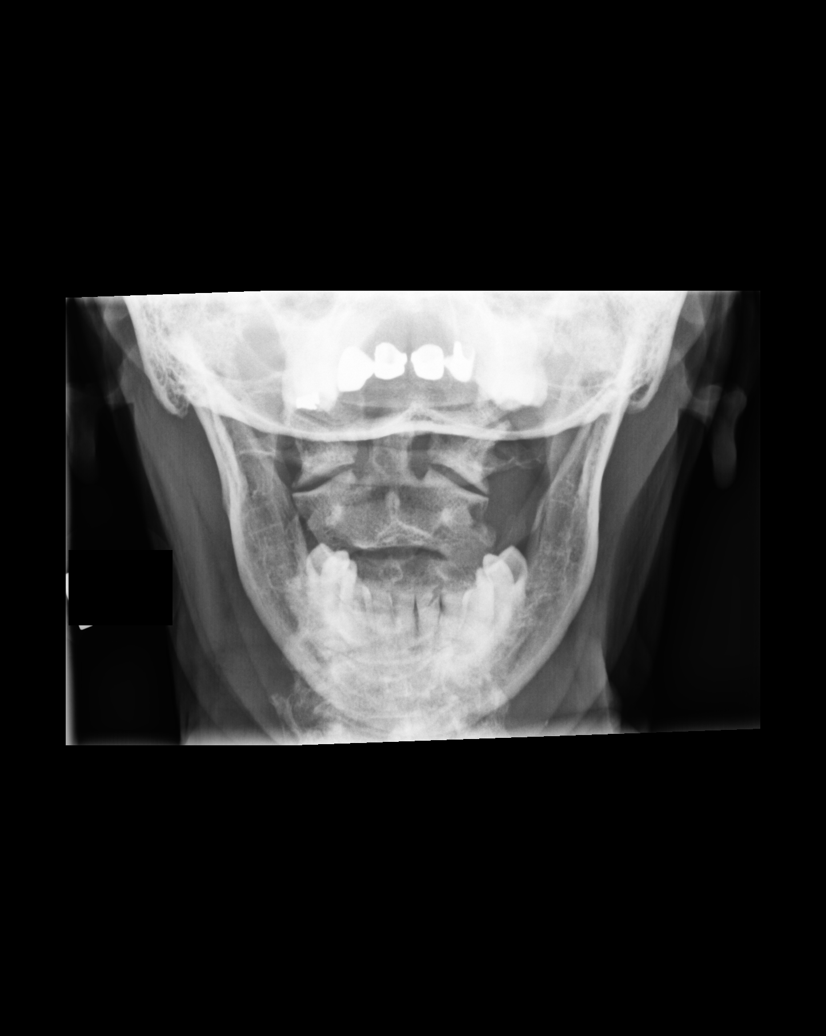

[5 of 5 positions shown; findings below may reference images not displayed]

FINDINGS: Straightening of the cervical spine. Vertebral body heights and
prevertebral soft tissue thickness appear normal. Moderate severe
diffuse degenerative change C3 through T1, progressed since 2272
comparison radiograph. Bilateral foraminal stenosis C3 through C7.
Dens and lateral masses are within normal limits.
IMPRESSION: Straightening of the cervical spine with moderate severe diffuse
degenerative change C3 through T1.

## 2020-12-17 ENCOUNTER — Telehealth: Payer: Self-pay | Admitting: Hematology and Oncology

## 2020-12-17 ENCOUNTER — Inpatient Hospital Stay: Payer: PPO

## 2020-12-17 ENCOUNTER — Inpatient Hospital Stay: Payer: PPO | Admitting: Hematology and Oncology

## 2020-12-17 NOTE — Telephone Encounter (Signed)
lft message that Dr. Lorenso Courier will not be in clinic today fam emergency. Asked patient to call back to reschedule. Okay to postpone appointment per JD.

## 2020-12-17 NOTE — Telephone Encounter (Signed)
Spoke with patient to reschedule appointments.

## 2021-01-02 ENCOUNTER — Other Ambulatory Visit: Payer: Self-pay | Admitting: Family Medicine

## 2021-01-02 DIAGNOSIS — Z1231 Encounter for screening mammogram for malignant neoplasm of breast: Secondary | ICD-10-CM

## 2021-01-21 DIAGNOSIS — H524 Presbyopia: Secondary | ICD-10-CM | POA: Diagnosis not present

## 2021-01-22 ENCOUNTER — Inpatient Hospital Stay: Payer: PPO | Attending: Hematology and Oncology

## 2021-01-22 ENCOUNTER — Other Ambulatory Visit: Payer: Self-pay

## 2021-01-22 ENCOUNTER — Encounter: Payer: Self-pay | Admitting: Hematology and Oncology

## 2021-01-22 ENCOUNTER — Other Ambulatory Visit: Payer: Self-pay | Admitting: Hematology and Oncology

## 2021-01-22 ENCOUNTER — Inpatient Hospital Stay (HOSPITAL_BASED_OUTPATIENT_CLINIC_OR_DEPARTMENT_OTHER): Payer: PPO | Admitting: Hematology and Oncology

## 2021-01-22 VITALS — BP 123/77 | HR 90 | Temp 97.7°F | Resp 17 | Ht <= 58 in | Wt 138.2 lb

## 2021-01-22 DIAGNOSIS — C8331 Diffuse large B-cell lymphoma, lymph nodes of head, face, and neck: Secondary | ICD-10-CM | POA: Insufficient documentation

## 2021-01-22 DIAGNOSIS — T451X5D Adverse effect of antineoplastic and immunosuppressive drugs, subsequent encounter: Secondary | ICD-10-CM | POA: Insufficient documentation

## 2021-01-22 DIAGNOSIS — Z87891 Personal history of nicotine dependence: Secondary | ICD-10-CM | POA: Diagnosis not present

## 2021-01-22 DIAGNOSIS — Z9221 Personal history of antineoplastic chemotherapy: Secondary | ICD-10-CM | POA: Insufficient documentation

## 2021-01-22 DIAGNOSIS — G620 Drug-induced polyneuropathy: Secondary | ICD-10-CM | POA: Insufficient documentation

## 2021-01-22 LAB — CMP (CANCER CENTER ONLY)
ALT: 18 U/L (ref 0–44)
AST: 23 U/L (ref 15–41)
Albumin: 3.8 g/dL (ref 3.5–5.0)
Alkaline Phosphatase: 96 U/L (ref 38–126)
Anion gap: 10 (ref 5–15)
BUN: 11 mg/dL (ref 8–23)
CO2: 24 mmol/L (ref 22–32)
Calcium: 10 mg/dL (ref 8.9–10.3)
Chloride: 106 mmol/L (ref 98–111)
Creatinine: 0.68 mg/dL (ref 0.44–1.00)
GFR, Estimated: >60 mL/min (ref >60)
Glucose, Bld: 99 mg/dL (ref 70–99)
Potassium: 4.3 mmol/L (ref 3.5–5.1)
Sodium: 140 mmol/L (ref 135–145)
Total Bilirubin: 0.5 mg/dL (ref 0.3–1.2)
Total Protein: 7.2 g/dL (ref 6.5–8.1)

## 2021-01-22 LAB — CBC WITH DIFFERENTIAL (CANCER CENTER ONLY)
Abs Immature Granulocytes: 0.02 10*3/uL (ref 0.00–0.07)
Basophils Absolute: 0 10*3/uL (ref 0.0–0.1)
Basophils Relative: 1 %
Eosinophils Absolute: 0.2 10*3/uL (ref 0.0–0.5)
Eosinophils Relative: 4 %
HCT: 40 % (ref 36.0–46.0)
Hemoglobin: 13.3 g/dL (ref 12.0–15.0)
Immature Granulocytes: 0 %
Lymphocytes Relative: 41 %
Lymphs Abs: 2.2 10*3/uL (ref 0.7–4.0)
MCH: 27.5 pg (ref 26.0–34.0)
MCHC: 33.3 g/dL (ref 30.0–36.0)
MCV: 82.6 fL (ref 80.0–100.0)
Monocytes Absolute: 0.6 10*3/uL (ref 0.1–1.0)
Monocytes Relative: 11 %
Neutro Abs: 2.3 10*3/uL (ref 1.7–7.7)
Neutrophils Relative %: 43 %
Platelet Count: 330 10*3/uL (ref 150–400)
RBC: 4.84 MIL/uL (ref 3.87–5.11)
RDW: 14.6 % (ref 11.5–15.5)
WBC Count: 5.4 10*3/uL (ref 4.0–10.5)
nRBC: 0 % (ref 0.0–0.2)

## 2021-01-22 LAB — LACTATE DEHYDROGENASE: LDH: 207 U/L — ABNORMAL HIGH (ref 98–192)

## 2021-01-22 NOTE — Progress Notes (Signed)
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 BCL2 rearrangement positive, no BCL6 or Myc rearrangement -03/2019: left supraclavicular (bulky), left axillary, RP, mesenteric and iliac adenopathy on PET; bone marrow bx negative for lymphoma  -Mid-03/2019 - late 06/2019: R-CHOP with G-CSF support  ? EOT PET showed decreased but residual left thoracic inlet LN disease ([place] 5); resolution of other sites of disease  -Late 07/2019 - 09/2019: ISRT, 45 Gy/25 fractions  ? Interim PET showed resolving FDG avidity in the LN's in the thoracic inlet ([place] 3); no new disease  ? 02/12/2020: repeat PET CT shows residual FDG avidity in the LN's in the thoracic inlet ([place] 3); no new disease  2. Port placed in 03/2019  TREATMENT REGIMEN:  04/04/2019 - 07/20/2019: R-CHOP with Udenyca x 6 cycles  08/20/2019 - 09/24/2019: ISRT, 45 Gy/25 fractions; completed on 09/24/2019   Interval History:  Sandra Payne 81 y.o. 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 The patient's last visit was on 09/18/2020. In the interim since the last visit she has had no hospitalizations or ED  visits.  On exam today Sandra Payne reports that she has been well in the interim since her last visit.  Her primary issue today is neuropathy that she has in her hands.  She notes that there is a numbness and a tingling, sometimes associated with aching and cramping in the hands.  She notes that this can also sometimes occur in her feet.  She notes that this does not get any worse but also has not gotten any better.  She is not having any signs or symptoms of recurrent disease such as lymphadenopathy, unexplained weight loss, or B symptoms.  She denies any fevers, chills, sweats, nausea, vomiting or diarrhea.  A full 10 point ROS is listed below.   MEDICAL HISTORY:  Past Medical History:  Diagnosis Date  . Anemia   . Arthralgia   . Arthritis   . Dyspnea    increased exertion  . Elevated cholesterol   . History of radiation therapy 08/20/19- 09/24/19   lower left neck 25 fractions of 1.8 Gy to total 45 Gy.   . Insomnia   . Lymphoma ([place])   . Neck mass    left    SURGICAL HISTORY: Past Surgical History:  Procedure Laterality Date  . ABDOMINAL HYSTERECTOMY     partial  . IR IMAGING GUIDED PORT INSERTION  03/28/2019  . IR REMOVAL TUN ACCESS W/ PORT W/O FL MOD SED  11/20/2019  . MASS BIOPSY Left 03/12/2019   Procedure: NECK MASS BIOPSY;  Surgeon: Izora Gala, MD;  Location: [facility];  Service: ENT;  Laterality: Left;  . TOTAL KNEE ARTHROPLASTY Right 01/06/2015   Procedure: RIGHT TOTAL KNEE ARTHROPLASTY;  Surgeon: Frederik Pear, MD;  Location: [facility]  OR;  Service: Orthopedics;  Laterality: Right;    SOCIAL HISTORY: Social History   Socioeconomic History  . Marital status: Widowed    Spouse name: Not on file  . Number of children: 1  . Years of education: Not on file  . Highest education level: Not on file  Occupational History  . Not on file  Tobacco Use  . Smoking status: Former Smoker    Packs/day: 0.25    Years: 2.00    Pack years: 0.50    Types: Cigarettes  .  Smokeless tobacco: Never Used  . Tobacco comment: she quit 30-40 years ago.   Vaping Use  . Vaping Use: Never used  Substance and Sexual Activity  . Alcohol use: Yes    Alcohol/week: 0.0 standard drinks    Comment: occasionally  . Drug use: No  . Sexual activity: Not on file  Other Topics Concern  . Not on file  Social History Narrative   Lives alone in a 2 story home.  Husband passed away on 09/14/14.     Works one day a week at [employer].     Retired from [employer].  Exercises regularly.    Patient is right-handed.   Social Determinants of Health   Financial Resource Strain: Not on file  Food Insecurity: Not on file  Transportation Needs: Not on file  Physical Activity: Not on file  Stress: Not on file  Social Connections: Not on file  Intimate Partner Violence: Not on file    FAMILY HISTORY: Family History  Problem Relation Age of Onset  . Diabetes Mother   . Hypertension Mother   . Breast cancer Neg Hx     ALLERGIES:  is allergic to codeine, sulfa antibiotics, and tramadol hcl.  MEDICATIONS:  Current Outpatient Medications  Medication Sig Dispense Refill  . zolpidem (AMBIEN) 10 MG tablet Take 5-10 mg by mouth at bedtime as needed.    [name] Kitchen acetaminophen (TYLENOL) 325 MG tablet Take 650 mg by mouth every 6 (six) hours as needed for mild pain or headache.     . albuterol (VENTOLIN HFA) 108 (90 Base) MCG/ACT inhaler Inhale 2 puffs into the lungs every 6 (six) hours as needed. (Patient not taking: Reported on 08/27/2020)    . Azelastine HCl 0.15 % SOLN     . calcium carbonate (TUMS - DOSED IN MG ELEMENTAL CALCIUM) 500 MG chewable tablet 2 tablets    . celecoxib (CELEBREX) 200 MG capsule Take 200 mg by mouth every other day.    . cetirizine (ZYRTEC) 10 MG tablet Take 10 mg by mouth daily.    . Chlorphen-PE-Acetaminophen (NOREL AD) 4-10-325 MG TABS 1 tablet as needed (Patient not taking: Reported on 08/27/2020)    . montelukast  (SINGULAIR) 10 MG tablet Take 10 mg by mouth at bedtime.  (Patient not taking: Reported on 01/22/2021)    . Multiple Vitamin (MULTIVITAMIN) tablet Take 1 tablet by mouth daily.    Wilfrid Lund & Ritonavir 20 x 150 MG & 10 x 100MG TBPK TAKE 3 TABLETS BY MOUTH 2 TIMES DAILY FOR 5 DAYS. 30 each 0  . Omega 3 1000 MG CAPS 1 capsule (Patient not taking: Reported on 08/27/2020)    . omeprazole (PRILOSEC) 40 MG capsule Take 40 mg by mouth daily.    . Pediatric Multivitamins-Fl (MULTIVITAMINS/FL PO) 1 tablet (Patient not taking: Reported on 08/27/2020)    . saccharomyces boulardii (FLORASTOR) 250 MG capsule Take 250 mg by mouth  2 (two) times daily.     No current facility-administered medications for this visit.    REVIEW OF SYSTEMS:   Constitutional: ( - ) fevers, ( - )  chills , ( - ) night sweats Eyes: ( - ) blurriness of vision, ( - ) double vision, ( - ) watery eyes Ears, nose, mouth, throat, and face: ( - ) mucositis, ( - ) sore throat Respiratory: ( - ) cough, ( - ) dyspnea, ( - ) wheezes Cardiovascular: ( - ) palpitation, ( - ) chest discomfort, ( - ) lower extremity swelling Gastrointestinal:  ( - ) nausea, ( - ) heartburn, ( - ) change in bowel habits Skin: ( - ) abnormal skin rashes Lymphatics: ( - ) new lymphadenopathy, ( - ) easy bruising Neurological: ( - ) numbness, ( - ) tingling, ( - ) new weaknesses Behavioral/Psych: ( - ) mood change, ( - ) new changes  All other systems were reviewed with the patient and are negative.  PHYSICAL EXAMINATION: ECOG PERFORMANCE STATUS: 1 - Symptomatic but completely ambulatory  Vitals:   01/22/21 1114  BP: 123/77  Pulse: 90  Resp: 17  Temp: 97.7 F (36.5 C)  SpO2: 99%   Filed Weights   01/22/21 1114  Weight: 138 lb 3.2 oz (62.7 kg)    GENERAL: well appearing elderly African American female. Alert, no distress and comfortable SKIN: skin color, texture, turgor are normal, no rashes or significant lesions EYES: conjunctiva are pink and  non-injected, sclera clear LUNGS: clear to auscultation and percussion with normal breathing effort HEART: regular rate & rhythm and no murmurs and no lower extremity edema Musculoskeletal: no cyanosis of digits and no clubbing  PSYCH: alert & oriented x 3, fluent speech NEURO: no focal motor/sensory deficits  LABORATORY DATA:  I have reviewed the data as listed CBC Latest Ref Rng & Units 01/22/2021 09/16/2020 05/15/2020  WBC 4.0 - 10.5 K/uL 5.4 5.6 5.7  Hemoglobin 12.0 - 15.0 g/dL 13.3 13.0 12.0  Hematocrit 36.0 - 46.0 % 40.0 41.1 37.5  Platelets 150 - 400 K/uL 330 292 271    CMP Latest Ref Rng & Units 01/22/2021 09/16/2020 05/15/2020  Glucose 70 - 99 mg/dL 99 90 114(H)  BUN 8 - 23 mg/dL '11 11 10  ' Creatinine 0.44 - 1.00 mg/dL 0.68 0.76 0.72  Sodium 135 - 145 mmol/L 140 141 139  Potassium 3.5 - 5.1 mmol/L 4.3 4.0 3.1(L)  Chloride 98 - 111 mmol/L 106 106 105  CO2 22 - 32 mmol/L '24 27 29  ' Calcium 8.9 - 10.3 mg/dL 10.0 9.1 9.2  Total Protein 6.5 - 8.1 g/dL 7.2 7.2 6.7  Total Bilirubin 0.3 - 1.2 mg/dL 0.5 0.4 0.6  Alkaline Phos 38 - 126 U/L 96 100 78  AST 15 - 41 U/L '23 18 20  ' ALT 0 - 44 U/L '18 18 18    ' RADIOGRAPHIC STUDIES: I have personally reviewed the radiological images as listed and agreed with the findings in the report: no evidence of residual lymphadenopathy.   No results found.  ASSESSMENT & PLAN Sandra Payne 80 y.o. 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  An interval PET scan was performed on 02/12/2020 showed some residual FDG avidity within the left thoracic inlet, consistent with prior PET CT scan performed on 11/05/2019.  Given the stability of these images I would recommend that we continue to monitor clinically with a  55-monthfollow-up visits.  Symptomatically the patient is doing quite well and is not showing any signs or symptoms of recurrence.  We will plan to have the patient return  in 6 months time for continued clinical monitoring.  #Stage III DLBCL; BCL2 rearrangement+, poor risk by R-IPI -S/p 6 cycles of R-CHOP with G-CSF and ISRT for residual disease -I reviewed imaging results in detail with the patient -her last CT scan on 09/16/2020 remained stable and did not show any evidence of recurrent disease. She can be monitored clinically from here on out.  -given the neck radiation, she would benefit from periodic TSH monitoring for any radiation-induced hypothyroidism  --RTC in 6 months for clinical monitoring/labs.   #Neuropathy --patient has numbness/tingling in the hands with some occasional cramping/ache of the fingers --notes cramps in lower extremity with occasional balance issues. --referral to Dr. VMickeal Skinnerfor evaluation per patient request.    No orders of the defined types were placed in this encounter.  All questions were answered. The patient knows to call the clinic with any problems, questions or concerns.  A total of more than 30 minutes were spent on this encounter and over half of that time was spent on counseling and coordination of care as outlined above.   JLedell Peoples MD Department of Hematology/Oncology [place] CosPhone: [phone]: [phone]: [email or web address]  01/22/2021 11:35 AM

## 2021-01-27 ENCOUNTER — Telehealth: Payer: Self-pay | Admitting: Hematology and Oncology

## 2021-01-27 NOTE — Telephone Encounter (Signed)
Scheduled per los. Called and left msg. Mailed printout  °

## 2021-02-02 ENCOUNTER — Telehealth: Payer: Self-pay | Admitting: Hematology and Oncology

## 2021-02-02 NOTE — Telephone Encounter (Signed)
Scheduled appointment per 06/13 sch msg. Patient is aware.

## 2021-02-05 DIAGNOSIS — M549 Dorsalgia, unspecified: Secondary | ICD-10-CM | POA: Diagnosis not present

## 2021-02-12 ENCOUNTER — Inpatient Hospital Stay (HOSPITAL_BASED_OUTPATIENT_CLINIC_OR_DEPARTMENT_OTHER): Payer: PPO | Admitting: Internal Medicine

## 2021-02-12 ENCOUNTER — Other Ambulatory Visit: Payer: Self-pay

## 2021-02-12 ENCOUNTER — Encounter: Payer: Self-pay | Admitting: Internal Medicine

## 2021-02-12 DIAGNOSIS — Z87891 Personal history of nicotine dependence: Secondary | ICD-10-CM

## 2021-02-12 DIAGNOSIS — G620 Drug-induced polyneuropathy: Secondary | ICD-10-CM | POA: Diagnosis not present

## 2021-02-12 DIAGNOSIS — T451X5A Adverse effect of antineoplastic and immunosuppressive drugs, initial encounter: Secondary | ICD-10-CM

## 2021-02-12 DIAGNOSIS — C8331 Diffuse large B-cell lymphoma, lymph nodes of head, face, and neck: Secondary | ICD-10-CM | POA: Diagnosis not present

## 2021-02-12 DIAGNOSIS — T451X5D Adverse effect of antineoplastic and immunosuppressive drugs, subsequent encounter: Secondary | ICD-10-CM | POA: Diagnosis not present

## 2021-02-12 DIAGNOSIS — Z9221 Personal history of antineoplastic chemotherapy: Secondary | ICD-10-CM

## 2021-02-12 MED ORDER — GABAPENTIN 100 MG PO CAPS: 200.0000 mg | ORAL_CAPSULE | Freq: Two times a day (BID) | ORAL | 3 refills | Status: DC

## 2021-02-12 NOTE — Progress Notes (Signed)
[facility] [place], [place] [address] [phone]   New Patient Evaluation  Date of Service: 02/12/21 Patient Name: Sandra Payne Patient MRN: [id number] Patient DOB: 11/27/1940 Provider: Ventura Sellers, MD  Identifying Statement:  Sandra Payne is a 80 y.o. female with Chemotherapy-induced neuropathy [facility]) who presents for initial consultation and evaluation regarding cancer associated neurologic deficits.    Referring Provider: Jonathon Jordan, MD [place] [address],  [place] [address]  Primary Cancer:  Oncologic History: Oncology History  DLBCL (diffuse large B cell lymphoma) ([place])  02/19/2019 Imaging   CT neck: IMPRESSION: 1. 7 cm left lower neck mass extending into the superior mediastinum most concerning for malignancy. This may reflect a conglomerate nodal mass or primary tumor of uncertain origin. 2. Additional enlarged lymph nodes adjacent to the mass in the left supraclavicular/retroclavicular and left subpectoral regions. 3.  Aortic Atherosclerosis (ICD10-I70.0).   03/12/2019 Procedure   Incisional left cervical LN bx by Dr. Constance Holster   03/12/2019 Pathology Results   Accession: [id number] Soft tissue mass, simple excision, Left Neck - DIFFUSE LARGE B-CELL LYMPHOMA - SEE COMMENT   03/19/2019 Initial Diagnosis   DLBCL (diffuse large B cell lymphoma) ([place])   03/29/2019 Imaging   PET: IMPRESSION: 1. The large left supraclavicular mass is intensely hypermetabolic compatible with history of lymphoma. There are also nearby FDG avid left supraclavicular, left axillary, and left retroperitoneal lymph nodes. 2. Hypermetabolic retroperitoneal, mesenteric and iliac adenopathy. 3. Aortic Atherosclerosis (ICD10-I70.0). Coronary artery calcifications.   04/04/2019 -  Chemotherapy   The patient had DOXOrubicin (ADRIAMYCIN) chemo injection 80 mg, 50 mg/m2 = 80 mg, Intravenous,  Once, 6 of 6  cycles Administration: 80 mg (04/04/2019), 80 mg (04/25/2019), 80 mg (05/16/2019), 80 mg (06/06/2019), 80 mg (06/27/2019), 80 mg (07/18/2019) palonosetron (ALOXI) injection 0.25 mg, 0.25 mg, Intravenous,  Once, 6 of 6 cycles Administration: 0.25 mg (04/04/2019), 0.25 mg (04/25/2019), 0.25 mg (05/16/2019), 0.25 mg (06/06/2019), 0.25 mg (06/27/2019), 0.25 mg (07/18/2019) pegfilgrastim-jmdb (FULPHILA) injection 6 mg, 6 mg, Subcutaneous,  Once, 6 of 6 cycles Administration: 6 mg (04/06/2019), 6 mg (04/27/2019), 6 mg (05/18/2019), 6 mg (06/08/2019), 6 mg (06/29/2019), 6 mg (07/20/2019) vinCRIStine (ONCOVIN) 2 mg in sodium chloride 0.9 % 50 mL chemo infusion, 2 mg, Intravenous,  Once, 6 of 6 cycles Administration: 2 mg (04/04/2019), 2 mg (04/25/2019), 2 mg (05/16/2019), 2 mg (06/06/2019), 2 mg (06/27/2019), 2 mg (07/18/2019) riTUXimab (RITUXAN) 600 mg in sodium chloride 0.9 % 250 mL (1.9355 mg/mL) infusion, 375 mg/m2 = 600 mg, Intravenous,  Once, 1 of 1 cycle Administration: 600 mg (04/04/2019) cyclophosphamide (CYTOXAN) 1,180 mg in sodium chloride 0.9 % 250 mL chemo infusion, 750 mg/m2 = 1,180 mg, Intravenous,  Once, 6 of 6 cycles Dose modification: 600 mg/m2 (original dose 750 mg/m2, Cycle 6, Reason: Dose not tolerated) Administration: 1,180 mg (04/04/2019), 1,180 mg (04/25/2019), 1,180 mg (05/16/2019), 1,180 mg (06/06/2019), 1,180 mg (06/27/2019), 940 mg (07/18/2019) riTUXimab (RITUXAN) 600 mg in sodium chloride 0.9 % 190 mL infusion, 375 mg/m2 = 600 mg, Intravenous,  Once, 5 of 5 cycles Administration: 600 mg (04/25/2019), 600 mg (05/16/2019), 600 mg (06/06/2019), 600 mg (06/27/2019), 600 mg (07/18/2019) fosaprepitant (EMEND) 150 mg, dexamethasone (DECADRON) 12 mg in sodium chloride 0.9 % 145 mL IVPB, , Intravenous,  Once, 6 of 6 cycles Administration:  (04/04/2019),  (04/25/2019),  (05/16/2019),  (06/06/2019),  (06/27/2019),  (07/18/2019)   for chemotherapy treatment.     06/12/2019 Imaging  PET (after 4 cycles of  R-CHOP): IMPRESSION: 1. Partial treatment response. Dominant left supraclavicular nodal mass is decreased in size and metabolism, with persistent Deauville score 5 uptake. Previously visualized hypermetabolic left retropectoral, retroperitoneal and lower mesenteric lymph nodes have resolved. 2. Stable low level splenic hypermetabolism, nonspecific, possibly reactive. Normal size spleen. No splenic masses. 3. Low level hypermetabolism throughout the skeleton, compatible with mildly stimulated marrow state. 4. Asymmetric right glottic uptake without CT mass correlate, cannot exclude left focal cord paralysis. 5.  Aortic Atherosclerosis (ICD10-I70.0).   07/30/2019 Imaging   EOT PET: IMPRESSION: 1. Partial metabolic response to therapy of nodal mass within the left side of the thoracic inlet. (Deauville) 5. 2. No new sites of disease identified. 3. Posterior right upper lobe ground-glass nodule versus area of subsegmental atelectasis. Recommend attention on follow-up. 4. 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 5. Coronary artery atherosclerosis. Aortic Atherosclerosis (ICD10-I70.0).   08/01/2019 Cancer Staging   Staging form: Hodgkin and Non-Hodgkin Lymphoma, AJCC 8th Edition - Clinical: Stage III - Signed by Tish Men, MD on 08/01/2019    11/05/2019 Imaging   PET: IMPRESSION: 1. Reduced size and activity of the nodal conglomeration at the left thoracic inlet. This currently measures as Deauville 3 activity, previously Deauville 5. No new hypermetabolic lesions are observed. 2. Other imaging findings of potential clinical significance: Aortic Atherosclerosis (ICD10-I70.0). Coronary atherosclerosis. 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 Sigmoid colon diverticulosis.     History of Present Illness: The patient's records from the referring physician were obtained and reviewed and the patient interviewed to confirm this HPI.  Sandra Payne presents  today to review neuropathy symptoms.  She describes at time painful tingling in her fingertips and lower feet, in addition to some grip strength weakness affecting her hands (difficult to open jars).  Onset of symptoms was 18 months ago, during/following chemotherapy for lymphoma.  Denies frank numbness, no balance issues.  Symptoms have "not really improved" much since that time despite cessation of therapy.  She denies diabetes, no significant alcohol intake.  Stays active and lives independently.  Medications: Current Outpatient Medications on File Prior to Visit  Medication Sig Dispense Refill   acetaminophen (TYLENOL) 325 MG tablet Take 650 mg by mouth every 6 (six) hours as needed for mild pain or headache.      albuterol (VENTOLIN HFA) 108 (90 Base) MCG/ACT inhaler Inhale 2 puffs into the lungs every 6 (six) hours as needed.     Azelastine HCl 0.15 % SOLN      calcium carbonate (TUMS - DOSED IN MG ELEMENTAL CALCIUM) 500 MG chewable tablet 2 tablets     celecoxib (CELEBREX) 200 MG capsule Take 200 mg by mouth every other day.     cetirizine (ZYRTEC) 10 MG tablet Take 10 mg by mouth daily.     Chlorphen-PE-Acetaminophen 4-10-325 MG TABS Take by mouth.     Multiple Vitamin (MULTIVITAMIN) tablet Take 1 tablet by mouth daily.     Omega 3 1000 MG CAPS      omeprazole (PRILOSEC) 40 MG capsule Take 40 mg by mouth daily.     saccharomyces boulardii (FLORASTOR) 250 MG capsule Take 250 mg by mouth 2 (two) times daily.     zolpidem (AMBIEN) 10 MG tablet Take 5-10 mg by mouth at bedtime as needed.     No current facility-administered medications on file prior to visit.    Allergies:  Allergies  Allergen Reactions   Codeine Itching and Rash   Tramadol Hcl  Other (See Comments)   Sulfa Antibiotics Other (See Comments) and Rash    Childhood allergy   Past Medical History:  Past Medical History:  Diagnosis Date   Anemia    Arthralgia    Arthritis    Dyspnea    increased exertion   Elevated  cholesterol    History of radiation therapy 08/20/19- 09/24/19   lower left neck 25 fractions of 1.8 Gy to total 45 Gy.    Insomnia    Lymphoma ([place])    Neck mass    left   Past Surgical History:  Past Surgical History:  Procedure Laterality Date   ABDOMINAL HYSTERECTOMY     partial   IR IMAGING GUIDED PORT INSERTION  03/28/2019   IR REMOVAL TUN ACCESS W/ PORT W/O FL MOD SED  11/20/2019   MASS BIOPSY Left 03/12/2019   Procedure: NECK MASS BIOPSY;  Surgeon: Izora Gala, MD;  Location: [facility];  Service: ENT;  Laterality: Left;   TOTAL KNEE ARTHROPLASTY Right 01/06/2015   Procedure: RIGHT TOTAL KNEE ARTHROPLASTY;  Surgeon: Frederik Pear, MD;  Location: [facility];  Service: Orthopedics;  Laterality: Right;   Social History:  Social History   Socioeconomic History   Marital status: Widowed    Spouse name: Not on file   Number of children: 1   Years of education: Not on file   Highest education level: Not on file  Occupational History   Not on file  Tobacco Use   Smoking status: Former    Packs/day: 0.25    Years: 2.00    Pack years: 0.50    Types: Cigarettes   Smokeless tobacco: Never   Tobacco comments:    she quit 30-40 years ago.   Vaping Use   Vaping Use: Never used  Substance and Sexual Activity   Alcohol use: Yes    Alcohol/week: 0.0 standard drinks    Comment: occasionally   Drug use: No   Sexual activity: Not on file  Other Topics Concern   Not on file  Social History Narrative   Lives alone in a 2 story home.  Husband passed away on 09/09/2014.     Works one day a week at [employer].     Retired from [employer].  Exercises regularly.    Patient is right-handed.   Social Determinants of Health   Financial Resource Strain: Not on file  Food Insecurity: Not on file  Transportation Needs: Not on file  Physical Activity: Not on file  Stress: Not on file  Social Connections: Not on file  Intimate Partner  Violence: Not on file   Family History:  Family History  Problem Relation Age of Onset   Diabetes Mother    Hypertension Mother    Breast cancer Neg Hx     Review of Systems: Constitutional: Doesn't report fevers, chills or abnormal weight loss Eyes: Doesn't report blurriness of vision Ears, nose, mouth, throat, and face: Doesn't report sore throat Respiratory: Doesn't report cough, dyspnea or wheezes Cardiovascular: Doesn't report palpitation, chest discomfort  Gastrointestinal:  Doesn't report nausea, constipation, diarrhea GU: Doesn't report incontinence Skin: Doesn't report skin rashes Neurological: Per HPI Musculoskeletal: Doesn't report joint pain Behavioral/Psych: Doesn't report anxiety  Physical Exam: Vitals:   02/12/21 0859  BP: (!) 144/77  Pulse: (!) 106  Resp: 18  Temp: (!) 96.9 F (36.1 C)  SpO2: 99%   KPS: 80. General: Alert, cooperative, pleasant, in no acute distress Head:  Normal EENT: No conjunctival injection or scleral icterus.  Lungs: Resp effort normal Cardiac: Regular rate Abdomen: Non-distended abdomen Skin: No rashes cyanosis or petechiae. Extremities: No clubbing or edema  Neurologic Exam: Mental Status: Awake, alert, attentive to examiner. Oriented to self and environment. Language is fluent with intact comprehension.  Cranial Nerves: Visual acuity is grossly normal. Visual fields are full. Extra-ocular movements intact. No ptosis. Face is symmetric Motor: Tone and bulk are normal. Power is full in both arms and legs. Reflexes are symmetric, no pathologic reflexes present.  Sensory: Stocking sensory loss Gait: Normal.   Labs: I have reviewed the data as listed    Component Value Date/Time   NA 140 01/22/2021 1018   NA 137 01/16/2015 0000   K 4.3 01/22/2021 1018   CL 106 01/22/2021 1018   CO2 24 01/22/2021 1018   GLUCOSE 99 01/22/2021 1018   BUN 11 01/22/2021 1018   BUN 12 01/16/2015 0000   CREATININE 0.68 01/22/2021 1018    CALCIUM 10.0 01/22/2021 1018   PROT 7.2 01/22/2021 1018   ALBUMIN 3.8 01/22/2021 1018   AST 23 01/22/2021 1018   ALT 18 01/22/2021 1018   ALKPHOS 96 01/22/2021 1018   BILITOT 0.5 01/22/2021 1018   GFRNONAA >60 01/22/2021 1018   GFRAA >60 05/15/2020 1441   Lab Results  Component Value Date   WBC 5.4 01/22/2021   NEUTROABS 2.3 01/22/2021   HGB 13.3 01/22/2021   HCT 40.0 01/22/2021   MCV 82.6 01/22/2021   PLT 330 01/22/2021     Assessment/Plan Chemotherapy-induced neuropathy ([place])  Sandra Payne presents with clinical syndrome consistent with symmetric, length dependent, small and large fiber peripheral neuropathy.  Etiology is exposure to chemotherapy, in particular intravenous vincristine from R-CHOP protocol in 2020.  She did not tolerate cymbalta well previously, nor the 400mg  dose level of gabapentin.    We recommended trail of lower dose gabapentin, starting with 200mg  BID.  We spent twenty additional minutes teaching regarding the natural history, biology, and historical experience in the treatment of neurologic complications of cancer.   We appreciate the opportunity to participate in the care of Sandra Payne.  We will touch base with her via phone in 1 month to assess response to therapy.  All questions were answered. The patient knows to call the clinic with any problems, questions or concerns. No barriers to learning were detected.  The total time spent in the encounter was 40 minutes and more than 50% was on counseling and review of test results   Ventura Sellers, MD Medical Director of Neuro-Oncology [facility] at [place] 02/12/21 2:56 PM

## 2021-02-27 DIAGNOSIS — R350 Frequency of micturition: Secondary | ICD-10-CM | POA: Diagnosis not present

## 2021-03-17 DIAGNOSIS — J019 Acute sinusitis, unspecified: Secondary | ICD-10-CM | POA: Diagnosis not present

## 2021-03-17 DIAGNOSIS — J40 Bronchitis, not specified as acute or chronic: Secondary | ICD-10-CM | POA: Diagnosis not present

## 2021-03-17 DIAGNOSIS — R059 Cough, unspecified: Secondary | ICD-10-CM | POA: Diagnosis not present

## 2021-03-19 ENCOUNTER — Inpatient Hospital Stay: Payer: PPO | Attending: Hematology and Oncology | Admitting: Internal Medicine

## 2021-03-19 ENCOUNTER — Inpatient Hospital Stay: Payer: PPO | Admitting: Internal Medicine

## 2021-03-19 DIAGNOSIS — G620 Drug-induced polyneuropathy: Secondary | ICD-10-CM

## 2021-03-19 DIAGNOSIS — T451X5D Adverse effect of antineoplastic and immunosuppressive drugs, subsequent encounter: Secondary | ICD-10-CM

## 2021-03-19 DIAGNOSIS — T451X5A Adverse effect of antineoplastic and immunosuppressive drugs, initial encounter: Secondary | ICD-10-CM

## 2021-03-19 NOTE — Progress Notes (Signed)
I connected with Sandra Payne on 03/19/21 at  2:00 PM EDT by telephone visit and verified that I am speaking with the correct person using two identifiers.  I discussed the limitations, risks, security and privacy concerns of performing an evaluation and management service by telemedicine and the availability of in-person appointments. I also discussed with the patient that there may be a patient responsible charge related to this service. The patient expressed understanding and agreed to proceed.  Other persons participating in the visit and their role in the encounter:  n/a  Patient's location:  Home  Provider's location:  Office  Chief Complaint:  Chemotherapy-induced neuropathy ([place])  History of Present Ilness: Sandra Payne describes no improvement in neuropathy symptoms.  She has been hesitant to dose the gabapentin because of concerns over side effects on the label.  Currently she is not taking anything for the pain/discomfort.  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  Recommended trial of gabapentin '200mg'$  BID as discussed previously. We discussed potential side effects, she is comfortable at this time with drug trial given her symptom burden.  Follow Up Instructions: RTC as needed with recurrent or refractory symptoms, patient preference  I discussed the assessment and treatment plan with the patient.  The patient was provided an opportunity to ask questions and all were answered.  The patient agreed with the plan and demonstrated understanding of the instructions.    The patient was advised to call back or seek an in-person evaluation if the symptoms worsen or if the condition fails to improve as anticipated.  I provided 5-10 minutes of non-face-to-face time during this enocunter.  Ventura Sellers, MD   I provided 15 minutes of non face-to-face telephone visit time during this encounter, and >  50% was spent counseling as documented under my assessment & plan.

## 2021-04-08 MED ORDER — PROPOFOL 1000 MG/100ML IV EMUL
INTRAVENOUS | Status: AC
  Filled 2021-04-08: qty 100

## 2021-04-13 ENCOUNTER — Encounter: Payer: Self-pay | Admitting: Hematology

## 2021-04-14 DIAGNOSIS — H6122 Impacted cerumen, left ear: Secondary | ICD-10-CM | POA: Diagnosis not present

## 2021-04-14 DIAGNOSIS — H938X2 Other specified disorders of left ear: Secondary | ICD-10-CM | POA: Diagnosis not present

## 2021-04-21 ENCOUNTER — Encounter: Payer: Self-pay | Admitting: Hematology

## 2021-04-21 ENCOUNTER — Ambulatory Visit (INDEPENDENT_AMBULATORY_CARE_PROVIDER_SITE_OTHER): Payer: PPO | Admitting: Dermatology

## 2021-04-21 ENCOUNTER — Other Ambulatory Visit: Payer: Self-pay

## 2021-04-21 DIAGNOSIS — L819 Disorder of pigmentation, unspecified: Secondary | ICD-10-CM | POA: Diagnosis not present

## 2021-04-21 DIAGNOSIS — L659 Nonscarring hair loss, unspecified: Secondary | ICD-10-CM | POA: Diagnosis not present

## 2021-04-21 NOTE — Patient Instructions (Signed)
2% - 5% Rogaine for women

## 2021-04-28 ENCOUNTER — Encounter: Payer: Self-pay | Admitting: Dermatology

## 2021-04-28 NOTE — Progress Notes (Signed)
   New Patient   Subjective  Sandra Payne is a 80 y.o. female who presents for the following: Skin Problem (Patient here for hair loss x few months. Not much itching. No new hair products. /No treatment per patient. /Patient also has some dark areas under eyes wanted something OTC that could help. ).  Hair loss and discussed facial discoloration Location:  Duration:  Quality:  Associated Signs/Symptoms: Modifying Factors:  Severity:  Timing: Context:    The following portions of the chart were reviewed this encounter and updated as appropriate:  Tobacco  Allergies  Meds  Problems  Med Hx  Surg Hx  Fam Hx      Objective  Well appearing patient in no apparent distress; mood and affect are within normal limits. Mid Parietal Scalp 30 to 40% loss of density without appreciable inflammation or fibrosis or follicular dropout.  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  Left Malar Cheek, Right Malar Cheek This macular hyperpigmentation is likely a mixture of heredity, age, and perhaps a component of UV damage.  There is no scientific evidence that any of the many advertised products produce any real improvement.  We did discuss the option of her consulting a cosmetic dermatologist or plastic surgeon but for now she does not intend to pursue this.    A focused examination was performed including head and neck.. Relevant physical exam findings are noted in the Assessment and Plan.   Assessment & Plan  Alopecia Mid Parietal Scalp  We discussed essentially all options available which are quite limited.  She may choose to try 5% Rogaine or generic minoxidil foam daily for 3 to 6 months.  Told of possibility of not improvement, unwanted hair growth on other areas, and irritation.  Follow-up can be by MyChart or phone in 3 to 6 months.  She does have the option of consulting one of the dermatologists in  the area loss clinics at a nearby [facility].  Dyschromia (2) Left Malar Cheek; Right Malar Cheek

## 2021-05-07 ENCOUNTER — Ambulatory Visit
Admission: RE | Admit: 2021-05-07 | Discharge: 2021-05-07 | Disposition: A | Discharge status: Home / Self Care | Payer: PPO | Source: Ambulatory Visit | Attending: Family Medicine | Admitting: Family Medicine

## 2021-05-07 ENCOUNTER — Other Ambulatory Visit: Payer: Self-pay

## 2021-05-07 DIAGNOSIS — Z1231 Encounter for screening mammogram for malignant neoplasm of breast: Secondary | ICD-10-CM

## 2021-05-07 DIAGNOSIS — M1990 Unspecified osteoarthritis, unspecified site: Secondary | ICD-10-CM | POA: Diagnosis not present

## 2021-05-07 DIAGNOSIS — M542 Cervicalgia: Secondary | ICD-10-CM | POA: Diagnosis not present

## 2021-05-07 DIAGNOSIS — G4700 Insomnia, unspecified: Secondary | ICD-10-CM | POA: Diagnosis not present

## 2021-06-09 DIAGNOSIS — E782 Mixed hyperlipidemia: Secondary | ICD-10-CM | POA: Diagnosis not present

## 2021-06-09 DIAGNOSIS — F4321 Adjustment disorder with depressed mood: Secondary | ICD-10-CM | POA: Diagnosis not present

## 2021-06-09 DIAGNOSIS — D649 Anemia, unspecified: Secondary | ICD-10-CM | POA: Diagnosis not present

## 2021-06-09 DIAGNOSIS — M1990 Unspecified osteoarthritis, unspecified site: Secondary | ICD-10-CM | POA: Diagnosis not present

## 2021-06-09 DIAGNOSIS — J45901 Unspecified asthma with (acute) exacerbation: Secondary | ICD-10-CM | POA: Diagnosis not present

## 2021-06-09 DIAGNOSIS — D61810 Antineoplastic chemotherapy induced pancytopenia: Secondary | ICD-10-CM | POA: Diagnosis not present

## 2021-06-09 DIAGNOSIS — J45909 Unspecified asthma, uncomplicated: Secondary | ICD-10-CM | POA: Diagnosis not present

## 2021-06-09 DIAGNOSIS — G4700 Insomnia, unspecified: Secondary | ICD-10-CM | POA: Diagnosis not present

## 2021-06-09 DIAGNOSIS — F330 Major depressive disorder, recurrent, mild: Secondary | ICD-10-CM | POA: Diagnosis not present

## 2021-06-09 DIAGNOSIS — D509 Iron deficiency anemia, unspecified: Secondary | ICD-10-CM | POA: Diagnosis not present

## 2021-06-26 DIAGNOSIS — G5702 Lesion of sciatic nerve, left lower limb: Secondary | ICD-10-CM | POA: Diagnosis not present

## 2021-07-02 DIAGNOSIS — M25552 Pain in left hip: Secondary | ICD-10-CM | POA: Diagnosis not present

## 2021-07-13 DIAGNOSIS — M1990 Unspecified osteoarthritis, unspecified site: Secondary | ICD-10-CM | POA: Diagnosis not present

## 2021-07-13 DIAGNOSIS — G4700 Insomnia, unspecified: Secondary | ICD-10-CM | POA: Diagnosis not present

## 2021-07-13 DIAGNOSIS — F4321 Adjustment disorder with depressed mood: Secondary | ICD-10-CM | POA: Diagnosis not present

## 2021-07-13 DIAGNOSIS — D649 Anemia, unspecified: Secondary | ICD-10-CM | POA: Diagnosis not present

## 2021-07-13 DIAGNOSIS — F330 Major depressive disorder, recurrent, mild: Secondary | ICD-10-CM | POA: Diagnosis not present

## 2021-07-13 DIAGNOSIS — D509 Iron deficiency anemia, unspecified: Secondary | ICD-10-CM | POA: Diagnosis not present

## 2021-07-13 DIAGNOSIS — D61810 Antineoplastic chemotherapy induced pancytopenia: Secondary | ICD-10-CM | POA: Diagnosis not present

## 2021-07-13 DIAGNOSIS — J45909 Unspecified asthma, uncomplicated: Secondary | ICD-10-CM | POA: Diagnosis not present

## 2021-07-13 DIAGNOSIS — J45901 Unspecified asthma with (acute) exacerbation: Secondary | ICD-10-CM | POA: Diagnosis not present

## 2021-07-13 DIAGNOSIS — E782 Mixed hyperlipidemia: Secondary | ICD-10-CM | POA: Diagnosis not present

## 2021-07-27 ENCOUNTER — Inpatient Hospital Stay: Payer: PPO | Attending: Hematology and Oncology | Admitting: Hematology and Oncology

## 2021-07-27 ENCOUNTER — Other Ambulatory Visit: Payer: Self-pay | Admitting: Hematology and Oncology

## 2021-07-27 ENCOUNTER — Inpatient Hospital Stay: Payer: PPO

## 2021-07-27 DIAGNOSIS — C8331 Diffuse large B-cell lymphoma, lymph nodes of head, face, and neck: Secondary | ICD-10-CM

## 2021-08-04 DIAGNOSIS — E785 Hyperlipidemia, unspecified: Secondary | ICD-10-CM | POA: Diagnosis not present

## 2021-08-04 DIAGNOSIS — M1990 Unspecified osteoarthritis, unspecified site: Secondary | ICD-10-CM | POA: Diagnosis not present

## 2021-08-05 DIAGNOSIS — M25552 Pain in left hip: Secondary | ICD-10-CM | POA: Diagnosis not present

## 2021-08-11 DIAGNOSIS — M25552 Pain in left hip: Secondary | ICD-10-CM | POA: Diagnosis not present

## 2021-08-18 DIAGNOSIS — H8110 Benign paroxysmal vertigo, unspecified ear: Secondary | ICD-10-CM | POA: Diagnosis not present

## 2021-08-22 DIAGNOSIS — E782 Mixed hyperlipidemia: Secondary | ICD-10-CM | POA: Diagnosis not present

## 2021-08-22 DIAGNOSIS — J45909 Unspecified asthma, uncomplicated: Secondary | ICD-10-CM | POA: Diagnosis not present

## 2021-08-22 DIAGNOSIS — M1990 Unspecified osteoarthritis, unspecified site: Secondary | ICD-10-CM | POA: Diagnosis not present

## 2021-08-22 DIAGNOSIS — G4700 Insomnia, unspecified: Secondary | ICD-10-CM | POA: Diagnosis not present

## 2021-08-22 DIAGNOSIS — F330 Major depressive disorder, recurrent, mild: Secondary | ICD-10-CM | POA: Diagnosis not present

## 2021-08-22 DIAGNOSIS — F4321 Adjustment disorder with depressed mood: Secondary | ICD-10-CM | POA: Diagnosis not present

## 2021-08-22 DIAGNOSIS — J45901 Unspecified asthma with (acute) exacerbation: Secondary | ICD-10-CM | POA: Diagnosis not present

## 2021-08-31 ENCOUNTER — Ambulatory Visit
Admission: RE | Admit: 2021-08-31 | Discharge: 2021-08-31 | Disposition: A | Discharge status: Home / Self Care | Payer: Medicare PPO | Source: Ambulatory Visit | Attending: Family Medicine | Admitting: Family Medicine

## 2021-08-31 ENCOUNTER — Encounter: Payer: Self-pay | Admitting: Hematology

## 2021-08-31 ENCOUNTER — Other Ambulatory Visit: Payer: Self-pay

## 2021-08-31 ENCOUNTER — Other Ambulatory Visit: Payer: Self-pay | Admitting: Family Medicine

## 2021-08-31 DIAGNOSIS — M25512 Pain in left shoulder: Secondary | ICD-10-CM

## 2021-10-20 ENCOUNTER — Encounter: Payer: Self-pay | Admitting: Hematology

## 2021-10-27 ENCOUNTER — Ambulatory Visit: Payer: Medicare PPO | Admitting: Pulmonary Disease

## 2021-11-24 DIAGNOSIS — N3000 Acute cystitis without hematuria: Secondary | ICD-10-CM | POA: Diagnosis not present

## 2021-12-02 DIAGNOSIS — F330 Major depressive disorder, recurrent, mild: Secondary | ICD-10-CM | POA: Diagnosis not present

## 2021-12-02 DIAGNOSIS — E782 Mixed hyperlipidemia: Secondary | ICD-10-CM | POA: Diagnosis not present

## 2021-12-02 DIAGNOSIS — M1990 Unspecified osteoarthritis, unspecified site: Secondary | ICD-10-CM | POA: Diagnosis not present

## 2021-12-02 DIAGNOSIS — G4700 Insomnia, unspecified: Secondary | ICD-10-CM | POA: Diagnosis not present

## 2021-12-14 DIAGNOSIS — H524 Presbyopia: Secondary | ICD-10-CM | POA: Diagnosis not present

## 2021-12-14 DIAGNOSIS — H2513 Age-related nuclear cataract, bilateral: Secondary | ICD-10-CM | POA: Diagnosis not present

## 2021-12-14 DIAGNOSIS — H43393 Other vitreous opacities, bilateral: Secondary | ICD-10-CM | POA: Diagnosis not present

## 2021-12-15 ENCOUNTER — Other Ambulatory Visit: Payer: Self-pay | Admitting: Family Medicine

## 2021-12-15 DIAGNOSIS — G620 Drug-induced polyneuropathy: Secondary | ICD-10-CM | POA: Diagnosis not present

## 2021-12-15 DIAGNOSIS — D692 Other nonthrombocytopenic purpura: Secondary | ICD-10-CM | POA: Diagnosis not present

## 2021-12-15 DIAGNOSIS — C8330 Diffuse large B-cell lymphoma, unspecified site: Secondary | ICD-10-CM | POA: Diagnosis not present

## 2021-12-15 DIAGNOSIS — E785 Hyperlipidemia, unspecified: Secondary | ICD-10-CM | POA: Diagnosis not present

## 2021-12-15 DIAGNOSIS — I700 Atherosclerosis of aorta: Secondary | ICD-10-CM | POA: Diagnosis not present

## 2021-12-15 DIAGNOSIS — D61810 Antineoplastic chemotherapy induced pancytopenia: Secondary | ICD-10-CM | POA: Diagnosis not present

## 2021-12-15 DIAGNOSIS — E559 Vitamin D deficiency, unspecified: Secondary | ICD-10-CM | POA: Diagnosis not present

## 2021-12-15 DIAGNOSIS — E2839 Other primary ovarian failure: Secondary | ICD-10-CM

## 2021-12-15 DIAGNOSIS — Z79899 Other long term (current) drug therapy: Secondary | ICD-10-CM | POA: Diagnosis not present

## 2021-12-15 DIAGNOSIS — M5090 Cervical disc disorder, unspecified, unspecified cervical region: Secondary | ICD-10-CM | POA: Diagnosis not present

## 2021-12-15 DIAGNOSIS — M4302 Spondylolysis, cervical region: Secondary | ICD-10-CM | POA: Diagnosis not present

## 2021-12-15 DIAGNOSIS — G3184 Mild cognitive impairment, so stated: Secondary | ICD-10-CM | POA: Diagnosis not present

## 2021-12-15 DIAGNOSIS — Z0000 Encounter for general adult medical examination without abnormal findings: Secondary | ICD-10-CM | POA: Diagnosis not present

## 2022-01-24 DIAGNOSIS — M5441 Lumbago with sciatica, right side: Secondary | ICD-10-CM | POA: Diagnosis not present

## 2022-01-25 ENCOUNTER — Other Ambulatory Visit: Payer: Self-pay | Admitting: Oncology

## 2022-01-30 DIAGNOSIS — M5441 Lumbago with sciatica, right side: Secondary | ICD-10-CM | POA: Diagnosis not present

## 2022-02-02 DIAGNOSIS — M545 Low back pain, unspecified: Secondary | ICD-10-CM | POA: Diagnosis not present

## 2022-02-14 ENCOUNTER — Other Ambulatory Visit: Payer: Self-pay | Admitting: Internal Medicine

## 2022-02-15 ENCOUNTER — Encounter: Payer: Self-pay | Admitting: Hematology

## 2022-02-22 DIAGNOSIS — G629 Polyneuropathy, unspecified: Secondary | ICD-10-CM | POA: Diagnosis not present

## 2022-03-22 DIAGNOSIS — M1990 Unspecified osteoarthritis, unspecified site: Secondary | ICD-10-CM | POA: Diagnosis not present

## 2022-03-22 DIAGNOSIS — G4700 Insomnia, unspecified: Secondary | ICD-10-CM | POA: Diagnosis not present

## 2022-03-22 DIAGNOSIS — E782 Mixed hyperlipidemia: Secondary | ICD-10-CM | POA: Diagnosis not present

## 2022-03-22 DIAGNOSIS — F330 Major depressive disorder, recurrent, mild: Secondary | ICD-10-CM | POA: Diagnosis not present

## 2022-04-13 ENCOUNTER — Other Ambulatory Visit: Payer: Self-pay | Admitting: Family Medicine

## 2022-04-13 DIAGNOSIS — Z1231 Encounter for screening mammogram for malignant neoplasm of breast: Secondary | ICD-10-CM

## 2022-05-04 DIAGNOSIS — E785 Hyperlipidemia, unspecified: Secondary | ICD-10-CM | POA: Diagnosis not present

## 2022-05-04 DIAGNOSIS — G620 Drug-induced polyneuropathy: Secondary | ICD-10-CM | POA: Diagnosis not present

## 2022-05-04 DIAGNOSIS — Z23 Encounter for immunization: Secondary | ICD-10-CM | POA: Diagnosis not present

## 2022-05-10 ENCOUNTER — Encounter: Payer: Self-pay | Admitting: Hematology

## 2022-05-10 ENCOUNTER — Ambulatory Visit: Payer: PPO

## 2022-05-11 ENCOUNTER — Encounter: Payer: Self-pay | Admitting: Hematology

## 2022-05-11 ENCOUNTER — Ambulatory Visit
Admission: RE | Admit: 2022-05-11 | Discharge: 2022-05-11 | Disposition: A | Discharge status: Home / Self Care | Payer: PPO | Source: Ambulatory Visit | Attending: Family Medicine | Admitting: Family Medicine

## 2022-05-11 DIAGNOSIS — Z1231 Encounter for screening mammogram for malignant neoplasm of breast: Secondary | ICD-10-CM

## 2022-05-26 DIAGNOSIS — J029 Acute pharyngitis, unspecified: Secondary | ICD-10-CM | POA: Diagnosis not present

## 2022-05-26 DIAGNOSIS — M25561 Pain in right knee: Secondary | ICD-10-CM | POA: Diagnosis not present

## 2022-05-26 DIAGNOSIS — M25562 Pain in left knee: Secondary | ICD-10-CM | POA: Diagnosis not present

## 2022-05-26 DIAGNOSIS — E785 Hyperlipidemia, unspecified: Secondary | ICD-10-CM | POA: Diagnosis not present

## 2022-05-31 DIAGNOSIS — M25561 Pain in right knee: Secondary | ICD-10-CM | POA: Diagnosis not present

## 2022-05-31 DIAGNOSIS — M25562 Pain in left knee: Secondary | ICD-10-CM | POA: Diagnosis not present

## 2022-06-08 ENCOUNTER — Ambulatory Visit
Admission: RE | Admit: 2022-06-08 | Discharge: 2022-06-08 | Disposition: A | Discharge status: Home / Self Care | Payer: PPO | Source: Ambulatory Visit | Attending: Family Medicine | Admitting: Family Medicine

## 2022-06-08 DIAGNOSIS — Z780 Asymptomatic menopausal state: Secondary | ICD-10-CM | POA: Diagnosis not present

## 2022-06-08 DIAGNOSIS — E2839 Other primary ovarian failure: Secondary | ICD-10-CM

## 2022-06-08 DIAGNOSIS — M8589 Other specified disorders of bone density and structure, multiple sites: Secondary | ICD-10-CM | POA: Diagnosis not present

## 2022-06-24 DIAGNOSIS — R221 Localized swelling, mass and lump, neck: Secondary | ICD-10-CM | POA: Diagnosis not present

## 2022-06-24 DIAGNOSIS — M542 Cervicalgia: Secondary | ICD-10-CM | POA: Diagnosis not present

## 2022-06-28 ENCOUNTER — Other Ambulatory Visit: Payer: Self-pay | Admitting: Family Medicine

## 2022-06-28 DIAGNOSIS — R221 Localized swelling, mass and lump, neck: Secondary | ICD-10-CM

## 2022-06-29 ENCOUNTER — Ambulatory Visit
Admission: RE | Admit: 2022-06-29 | Discharge: 2022-06-29 | Disposition: A | Discharge status: Home / Self Care | Payer: BC Managed Care – PPO | Source: Ambulatory Visit | Attending: Family Medicine | Admitting: Family Medicine

## 2022-06-29 DIAGNOSIS — R221 Localized swelling, mass and lump, neck: Secondary | ICD-10-CM

## 2022-08-24 ENCOUNTER — Encounter: Payer: Self-pay | Admitting: Hematology

## 2022-08-25 ENCOUNTER — Encounter: Payer: Self-pay | Admitting: Hematology

## 2022-08-30 ENCOUNTER — Encounter: Payer: Self-pay | Admitting: Neurology

## 2022-08-30 ENCOUNTER — Ambulatory Visit (INDEPENDENT_AMBULATORY_CARE_PROVIDER_SITE_OTHER): Payer: PPO | Admitting: Neurology

## 2022-08-30 VITALS — BP 116/70 | HR 103 | Ht <= 58 in | Wt 143.0 lb

## 2022-08-30 DIAGNOSIS — T451X5A Adverse effect of antineoplastic and immunosuppressive drugs, initial encounter: Secondary | ICD-10-CM

## 2022-08-30 DIAGNOSIS — G620 Drug-induced polyneuropathy: Secondary | ICD-10-CM

## 2022-08-30 DIAGNOSIS — R252 Cramp and spasm: Secondary | ICD-10-CM

## 2022-08-30 MED ORDER — TIZANIDINE HCL 2 MG PO TABS: 2.0000 mg | ORAL_TABLET | Freq: Every day | ORAL | 5 refills | Status: DC

## 2022-08-30 NOTE — Patient Instructions (Addendum)
Start tizanidine '2mg'$  at bedtime   Start physical therapy for leg stretching and low back stretching  You can also try magnesium oxide '400mg'$  daily or 2-3 of tonic water  Nerve testing of the hands  Return to clinic in 2 months  ELECTROMYOGRAM AND NERVE CONDUCTION STUDIES (EMG/NCS) INSTRUCTIONS  How to Prepare The neurologist conducting the EMG will need to know if you have certain medical conditions. Tell the neurologist and other EMG lab personnel if you: Have a pacemaker or any other electrical medical device Take blood-thinning medications Have hemophilia, a blood-clotting disorder that causes prolonged bleeding Bathing Take a shower or bath shortly before your exam in order to remove oils from your skin. Don't apply lotions or creams before the exam.  What to Expect You'll likely be asked to change into a hospital gown for the procedure and lie down on an examination table. The following explanations can help you understand what will happen during the exam.  Electrodes. The neurologist or a technician places surface electrodes at various locations on your skin depending on where you're experiencing symptoms. Or the neurologist may insert needle electrodes at different sites depending on your symptoms.  Sensations. The electrodes will at times transmit a tiny electrical current that you may feel as a twinge or spasm. The needle electrode may cause discomfort or pain that usually ends shortly after the needle is removed. If you are concerned about discomfort or pain, you may want to talk to the neurologist about taking a short break during the exam.  Instructions. During the needle EMG, the neurologist will assess whether there is any spontaneous electrical activity when the muscle is at rest - activity that isn't present in healthy muscle tissue - and the degree of activity when you slightly contract the muscle.  He or she will give you instructions on resting and contracting a muscle at  appropriate times. Depending on what muscles and nerves the neurologist is examining, he or she may ask you to change positions during the exam.  After your EMG You may experience some temporary, minor bruising where the needle electrode was inserted into your muscle. This bruising should fade within several days. If it persists, contact your primary care doctor.

## 2022-08-30 NOTE — Progress Notes (Signed)
[facility] Clinic Note - Initial Visit   Date: 08/30/2022   Sandra Payne MRN: [id number] DOB: 1941/08/22   Dear Dr. Stephanie Acre:  Thank you for your kind referral of Sandra Payne for consultation of muscle cramps. Although her history is well known to you, please allow [place] to reiterate it for the purpose of our medical record. The patient was accompanied to the clinic by  who also provides collateral information.     Sandra Payne is a 82 y.o. right-handed female with hyperlipidemia, insomnia, arthralgias, and diffuse large B cell lymphoma (2020), chemotherapy-induced neuropathy presenting for evaluation of muscle cramps   IMPRESSION/PLAN: Muscle cramps, possibly related to lumbar canal stenosis vs statin (crestor)   - Start tizanidine '2mg'$  at bedtime  - She may also try magnesium oxide '400mg'$  daily and/or 2-3 glasses of tonic water  - Start physical therapy for low back and leg stretching  2.   Bilateral hand paresthesias.  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    - Repeat NCS/EMG of both hands   - Consider MRI cervical spine based on findings  3.  Chemotherapy-induced neuropathy, improved. She denies distal feet paresthesias and sensory exam distally is intact.   - Continue gabapentin '200mg'$  BID, may consider tapering in the future, but since gabapentin can help with cramps, I will not make any changes at this time  Return to clinic in 2 months  ------------------------------------------------------------- History of present illness: Over the past several months, she had severe muscle cramp of the right calf and bilateral feet (worse on the left) which is painful.  It lasts a few minutes and leaves the muscle sore.   She has tried mustard.  She has chronic low back pain and will be seeing orthopeadics for this.    She also reports having abnormal sensation and numbness of both hands.  She has left  sided neck pain, but nothing that radiates down the arm.  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   Patient was diagnosed with diffuse large B cells lymphoma in 2020 and evaluated at the [facility] by Dr. Cecil Cobbs, neurooncologist, for chemotherapy-induced neuropathy.  She takes gabapentin '200mg'$  BID. Currently, she denies any numbness/tingling of the feet.   She lives alone in a three level home.  Husband passed in December 2015.  Out-side paper records, electronic medical record, and images have been reviewed where available and summarized as: Lab Results  Component Value Date   TSH 1.336 09/16/2020   No results found for: "ESRSEDRATE", "POCTSEDRATE"  Past Medical History:  Diagnosis Date   Anemia    Arthralgia    Arthritis    Dyspnea    increased exertion   Elevated cholesterol    History of radiation therapy 08/20/19- 09/24/19   lower left neck 25 fractions of 1.8 Gy to total 45 Gy.    Insomnia    Lymphoma ([place])    Neck mass    left    Past Surgical History:  Procedure Laterality Date   ABDOMINAL HYSTERECTOMY     partial   IR IMAGING GUIDED PORT INSERTION  03/28/2019   IR REMOVAL TUN ACCESS W/ PORT W/O FL MOD SED  11/20/2019   MASS BIOPSY Left 03/12/2019   Procedure: NECK MASS BIOPSY;  Surgeon: Izora Gala, MD;  Location: [facility];  Service: ENT;  Laterality: Left;   TOTAL KNEE ARTHROPLASTY Right 01/06/2015   Procedure: RIGHT TOTAL KNEE ARTHROPLASTY;  Surgeon: Frederik Pear, MD;  Location: [facility];  Service: Orthopedics;  Laterality: Right;     Medications:  Outpatient Encounter Medications as of 08/30/2022  Medication Sig   acetaminophen (TYLENOL) 325 MG tablet Take 650 mg by mouth every 6 (six) hours as needed for mild pain or headache.    albuterol (VENTOLIN HFA) 108 (90 Base) MCG/ACT inhaler Inhale 2 puffs into the lungs every 6 (six) hours as needed.   Azelastine  HCl 0.15 % SOLN    calcium carbonate (TUMS - DOSED IN MG ELEMENTAL CALCIUM) 500 MG chewable tablet 2 tablets   celecoxib (CELEBREX) 200 MG capsule Take 200 mg by mouth every other day.   cetirizine (ZYRTEC) 10 MG tablet Take 10 mg by mouth daily.   gabapentin (NEURONTIN) 100 MG capsule TAKE 2 CAPSULES BY MOUTH 2 TIMES DAILY. (Patient taking differently: Take 100 mg at bedtime)   Multiple Vitamin (MULTIVITAMIN) tablet Take 1 tablet by mouth daily.   Omega 3 1000 MG CAPS    omeprazole (PRILOSEC) 40 MG capsule Take 40 mg by mouth daily.   rosuvastatin (CRESTOR) 10 MG tablet Take 10 mg by mouth daily.   tiZANidine (ZANAFLEX) 2 MG tablet Take 1 tablet (2 mg total) by mouth at bedtime. OK to take an extra tablet if needed.   zolpidem (AMBIEN) 10 MG tablet Take 5-10 mg by mouth at bedtime as needed.   [DISCONTINUED] ascorbic acid (VITAMIN C) 100 MG tablet Take by mouth. (Patient not taking: Reported on 08/30/2022)   [DISCONTINUED] Chlorphen-PE-Acetaminophen 4-10-325 MG TABS Take by mouth. (Patient not taking: Reported on 08/30/2022)   No facility-administered encounter medications on file as of 08/30/2022.    Allergies:  Allergies  Allergen Reactions   Codeine Itching and Rash   Tramadol Hcl Other (See Comments)   Sulfa Antibiotics Other (See Comments) and Rash    Childhood allergy    Family History: Family History  Problem Relation Age of Onset   Diabetes Mother    Hypertension Mother    Breast cancer Neg Hx     Social History: Social History   Tobacco Use   Smoking status: Former    Packs/day: 0.25    Years: 2.00    Total pack years: 0.50    Types: Cigarettes   Smokeless tobacco: Never   Tobacco comments:    she quit 30-40 years ago.   Vaping Use   Vaping Use: Never used  Substance Use Topics   Alcohol use: Yes    Alcohol/week: 0.0 standard drinks of alcohol    Comment: occasionally   Drug use: No   Social History   Social History Narrative   Lives alone in a 2 story  home.  Husband passed away on 09-16-14.     Works one day a week at [employer].     Retired from [employer].  Exercises regularly.    Patient is right-handed.    Vital Signs:  BP 116/70   Pulse (!) 103   Ht '4\' 10"'$  (1.473 m)   Wt 143 lb (64.9 kg)   SpO2 96%   BMI 29.89 kg/m    Neurological Exam: MENTAL STATUS including orientation to time, place, person, recent and remote memory, attention span and concentration, language, and fund of knowledge is normal.  Speech is not dysarthric.  CRANIAL NERVES: II:  No visual field  defects.   III-IV-VI: Pupils equal round and reactive to light.  Normal conjugate, extra-ocular eye movements in all directions of gaze.  No nystagmus.  No ptosis.   V:  Normal facial sensation.    VII:  Normal facial symmetry and movements.   VIII:  Normal hearing and vestibular function.   IX-X:  Normal palatal movement.   XI:  Normal shoulder shrug and head rotation.   XII:  Normal tongue strength and range of motion, no deviation or fasciculation.  MOTOR:  Motor strength is 5/5 throughout except mild weakness with finger abductors and ABP.  No atrophy, fasciculations or abnormal movements.  No pronator drift.  MSRs:                                           Right        Left brachioradialis 2+  2+  biceps 2+  2+  triceps 2+  2+  patellar 2+  2+  ankle jerk 0  0   SENSORY:  Normal and symmetric perception of light touch, pinprick, vibration, and proprioception.  Romberg's sign absent.   COORDINATION/GAIT: Normal finger-to- nose-finger.  Intact rapid alternating movements bilaterally. Gait is narrow-based, stable, unassisted.  Unable to perform tandem gait.   Thank you for allowing me to participate in patient's care.  If I can answer any additional questions, I would be pleased to do so.    Sincerely,    Elison Worrel K. Posey Pronto, DO

## 2022-09-02 DIAGNOSIS — M19012 Primary osteoarthritis, left shoulder: Secondary | ICD-10-CM | POA: Diagnosis not present

## 2022-09-06 ENCOUNTER — Telehealth: Payer: Self-pay | Admitting: *Deleted

## 2022-09-06 NOTE — Patient Outreach (Signed)
  Care Coordination   09/06/2022 Name: Sandra Payne MRN: [id number] DOB: [birth year]   Care Coordination Outreach Attempts:  An unsuccessful telephone outreach was attempted today to offer the patient information about available care coordination services as a benefit of their health plan.   Follow Up Plan:  Additional outreach attempts will be made to offer the patient care coordination information and services.   Encounter Outcome:  No Answer   Care Coordination Interventions:  No, not indicated    Raina Mina, RN Care Management Coordinator [place] Office [phone]

## 2022-09-14 DIAGNOSIS — E785 Hyperlipidemia, unspecified: Secondary | ICD-10-CM | POA: Diagnosis not present

## 2022-09-15 ENCOUNTER — Encounter: Payer: Self-pay | Admitting: Hematology

## 2022-09-16 DIAGNOSIS — M25512 Pain in left shoulder: Secondary | ICD-10-CM | POA: Diagnosis not present

## 2022-09-16 DIAGNOSIS — R2689 Other abnormalities of gait and mobility: Secondary | ICD-10-CM | POA: Diagnosis not present

## 2022-09-17 DIAGNOSIS — M1990 Unspecified osteoarthritis, unspecified site: Secondary | ICD-10-CM | POA: Diagnosis not present

## 2022-09-17 DIAGNOSIS — G4700 Insomnia, unspecified: Secondary | ICD-10-CM | POA: Diagnosis not present

## 2022-09-17 DIAGNOSIS — J45998 Other asthma: Secondary | ICD-10-CM | POA: Diagnosis not present

## 2022-09-17 DIAGNOSIS — F330 Major depressive disorder, recurrent, mild: Secondary | ICD-10-CM | POA: Diagnosis not present

## 2022-09-17 DIAGNOSIS — E785 Hyperlipidemia, unspecified: Secondary | ICD-10-CM | POA: Diagnosis not present

## 2022-09-20 DIAGNOSIS — M25512 Pain in left shoulder: Secondary | ICD-10-CM | POA: Diagnosis not present

## 2022-09-20 DIAGNOSIS — R2689 Other abnormalities of gait and mobility: Secondary | ICD-10-CM | POA: Diagnosis not present

## 2022-09-23 ENCOUNTER — Encounter: Payer: Self-pay | Admitting: Hematology

## 2022-09-24 DIAGNOSIS — R2689 Other abnormalities of gait and mobility: Secondary | ICD-10-CM | POA: Diagnosis not present

## 2022-09-24 DIAGNOSIS — M25512 Pain in left shoulder: Secondary | ICD-10-CM | POA: Diagnosis not present

## 2022-09-29 DIAGNOSIS — M25512 Pain in left shoulder: Secondary | ICD-10-CM | POA: Diagnosis not present

## 2022-09-29 DIAGNOSIS — R2689 Other abnormalities of gait and mobility: Secondary | ICD-10-CM | POA: Diagnosis not present

## 2022-09-30 ENCOUNTER — Encounter: Payer: Self-pay | Admitting: Hematology

## 2022-09-30 ENCOUNTER — Encounter: Payer: Self-pay | Admitting: Neurology

## 2022-10-01 DIAGNOSIS — M25512 Pain in left shoulder: Secondary | ICD-10-CM | POA: Diagnosis not present

## 2022-10-01 DIAGNOSIS — R2689 Other abnormalities of gait and mobility: Secondary | ICD-10-CM | POA: Diagnosis not present

## 2022-10-06 DIAGNOSIS — R2689 Other abnormalities of gait and mobility: Secondary | ICD-10-CM | POA: Diagnosis not present

## 2022-10-06 DIAGNOSIS — M25512 Pain in left shoulder: Secondary | ICD-10-CM | POA: Diagnosis not present

## 2022-10-08 DIAGNOSIS — M25512 Pain in left shoulder: Secondary | ICD-10-CM | POA: Diagnosis not present

## 2022-10-08 DIAGNOSIS — R2689 Other abnormalities of gait and mobility: Secondary | ICD-10-CM | POA: Diagnosis not present

## 2022-10-13 DIAGNOSIS — M25512 Pain in left shoulder: Secondary | ICD-10-CM | POA: Diagnosis not present

## 2022-10-13 DIAGNOSIS — R2689 Other abnormalities of gait and mobility: Secondary | ICD-10-CM | POA: Diagnosis not present

## 2022-10-14 DIAGNOSIS — H43393 Other vitreous opacities, bilateral: Secondary | ICD-10-CM | POA: Diagnosis not present

## 2022-10-14 DIAGNOSIS — H2513 Age-related nuclear cataract, bilateral: Secondary | ICD-10-CM | POA: Diagnosis not present

## 2022-10-14 DIAGNOSIS — H524 Presbyopia: Secondary | ICD-10-CM | POA: Diagnosis not present

## 2022-10-15 ENCOUNTER — Ambulatory Visit: Payer: PPO | Admitting: Podiatry

## 2022-10-15 DIAGNOSIS — F330 Major depressive disorder, recurrent, mild: Secondary | ICD-10-CM | POA: Diagnosis not present

## 2022-10-15 DIAGNOSIS — E785 Hyperlipidemia, unspecified: Secondary | ICD-10-CM | POA: Diagnosis not present

## 2022-10-15 DIAGNOSIS — J45998 Other asthma: Secondary | ICD-10-CM | POA: Diagnosis not present

## 2022-10-15 DIAGNOSIS — D649 Anemia, unspecified: Secondary | ICD-10-CM | POA: Diagnosis not present

## 2022-10-15 DIAGNOSIS — R2689 Other abnormalities of gait and mobility: Secondary | ICD-10-CM | POA: Diagnosis not present

## 2022-10-15 DIAGNOSIS — G4700 Insomnia, unspecified: Secondary | ICD-10-CM | POA: Diagnosis not present

## 2022-10-15 DIAGNOSIS — M1990 Unspecified osteoarthritis, unspecified site: Secondary | ICD-10-CM | POA: Diagnosis not present

## 2022-10-15 DIAGNOSIS — M25512 Pain in left shoulder: Secondary | ICD-10-CM | POA: Diagnosis not present

## 2022-10-21 DIAGNOSIS — M25512 Pain in left shoulder: Secondary | ICD-10-CM | POA: Diagnosis not present

## 2022-10-21 DIAGNOSIS — R2689 Other abnormalities of gait and mobility: Secondary | ICD-10-CM | POA: Diagnosis not present

## 2022-10-25 ENCOUNTER — Encounter: Payer: Self-pay | Admitting: Hematology

## 2022-10-25 ENCOUNTER — Ambulatory Visit (INDEPENDENT_AMBULATORY_CARE_PROVIDER_SITE_OTHER): Payer: HMO | Admitting: Podiatry

## 2022-10-25 DIAGNOSIS — B351 Tinea unguium: Secondary | ICD-10-CM | POA: Diagnosis not present

## 2022-10-25 DIAGNOSIS — M21619 Bunion of unspecified foot: Secondary | ICD-10-CM

## 2022-10-25 NOTE — Patient Instructions (Signed)
I have ordered a medication for you that will come from [facility] in [place]. They should be calling you to verify insurance and will mail the medication to you. If you live close by then you can go by their pharmacy to pick up the medication. Their phone number is [phone]. If you do not hear from them in the next few days, please give [place] a call at [phone].   --  For the bunion you can use VOLTAREN GEL on it and use the bunion pad  --  Bunion A bunion (hallux valgus) is a bump that forms slowly on the inner side of the big toe joint. It occurs when the big toe turns toward the second toe. Bunions may be small at first, but they often get larger over time. They can make walking painful. What are the causes? This condition may be caused by: Wearing narrow or pointed shoes that force the big toe to press against the other toes. Abnormal foot development that causes the foot to roll inward. Changes in the foot that are caused by certain diseases, such as rheumatoid arthritis or polio. A foot injury. What increases the risk? The following factors may make you more likely to develop this condition: Wearing shoes that squeeze the toes together. Having certain diseases, such as: Rheumatoid arthritis. Polio. Cerebral palsy. Having family members who have bunions. Being born with abnormally shaped feet (a foot deformity), such as flat feet or low arches. Doing activities that put a lot of pressure on the feet, such as ballet dancing. What are the signs or symptoms?  The main symptom of this condition is a bump on your big toe that you can notice. Other symptoms may include: Pain. Redness and inflammation around your big toe. Thick or hardened skin on your big toe or between your toes. Stiffness or loss of motion in your big toe. Trouble with walking. How is this diagnosed? This condition may be diagnosed based on your symptoms, medical history, and activities. You may  also have tests and imaging, such as: X-rays. These allow your health care provider to check the position of the bones in your foot and look for damage to your joint. They also help your health care provider determine the severity of your bunion and the best way to treat it. Joint aspiration. In this test, a sample of fluid is removed from the toe joint. This test may be done if you are in a lot of pain. It helps rule out diseases that cause painful swelling of the joints, such as arthritis or gout. How is this treated? Treatment depends on the severity of your symptoms. The goal of treatment is to relieve symptoms and prevent your bunion from getting worse. Your health care provider may recommend: Wearing shoes that have a wide toe box, or using bunion pads to cushion the affected area. Taping your toes together to keep them in a normal position. Placing a device inside your shoe (orthotic device) to help reduce pressure on your toe joint. Taking medicine to ease pain and inflammation. Putting ice or heat on the affected area. Doing stretching exercises. Surgery, for severe cases. Follow these instructions at home: Managing pain, stiffness, and swelling     If directed, put ice on the painful area. To do this: Put ice in a plastic bag. Place a towel between your skin and the bag. Leave the ice on for 20 minutes, 2-3 times a day. Remove the ice if your skin turns  bright red. This is very important. If you cannot feel pain, heat, or cold, you have a greater risk of damage to the area. If directed, apply heat to the affected area before you exercise. Use the heat source that your health care provider recommends, such as a moist heat pack or a heating pad. Place a towel between your skin and the heat source. Leave the heat on for 20-30 minutes. Remove the heat if your skin turns bright red. This is especially important if you are unable to feel pain, heat, or cold. You have a greater risk of  getting burned. General instructions Do exercises as told by your health care provider. Support your toe joint with proper footwear, shoe padding, or taping as told by your health care provider. Take over-the-counter and prescription medicines only as told by your health care provider. Do not use any products that contain nicotine or tobacco, such as cigarettes, e-cigarettes, and chewing tobacco. If you need help quitting, ask your health care provider. Keep all follow-up visits. This is important. Contact a health care provider if: Your symptoms get worse. Your symptoms do not improve in 2 weeks. Get help right away if: You have severe pain and trouble with walking. Summary A bunion is a bump on the inner side of the big toe joint that forms when the big toe turns toward the second toe. Bunions can make walking painful. Treatment depends on the severity of your symptoms. Support your toe joint with proper footwear, shoe padding, or taping as told by your health care provider. This information is not intended to replace advice given to you by your health care provider. Make sure you discuss any questions you have with your health care provider. Document Revised: 12/14/2019 Document Reviewed: 12/14/2019 Elsevier Patient Education  [place].

## 2022-10-26 ENCOUNTER — Telehealth: Payer: Self-pay | Admitting: *Deleted

## 2022-10-26 DIAGNOSIS — R2689 Other abnormalities of gait and mobility: Secondary | ICD-10-CM | POA: Diagnosis not present

## 2022-10-26 DIAGNOSIS — M25512 Pain in left shoulder: Secondary | ICD-10-CM | POA: Diagnosis not present

## 2022-10-26 NOTE — Telephone Encounter (Signed)
Pharmacist w/ [phone]) is needing clarification on medication(not seeing the name in epic) submitted, has dimethoyl sulfoxide in it and patient has sulfur allergies, does he feel comfortable dispensing even though it is topical? Please advise.

## 2022-10-27 NOTE — Telephone Encounter (Signed)
Spoke with [facility], cannot leave sulfa out because it is the liquid base for the formula,told him that something else will be sent.

## 2022-10-28 ENCOUNTER — Ambulatory Visit (INDEPENDENT_AMBULATORY_CARE_PROVIDER_SITE_OTHER): Payer: HMO | Admitting: Neurology

## 2022-10-28 DIAGNOSIS — T451X5A Adverse effect of antineoplastic and immunosuppressive drugs, initial encounter: Secondary | ICD-10-CM

## 2022-10-28 DIAGNOSIS — M5412 Radiculopathy, cervical region: Secondary | ICD-10-CM

## 2022-10-28 DIAGNOSIS — G620 Drug-induced polyneuropathy: Secondary | ICD-10-CM

## 2022-10-28 DIAGNOSIS — R252 Cramp and spasm: Secondary | ICD-10-CM | POA: Diagnosis not present

## 2022-10-28 NOTE — Procedures (Signed)
[facility]  [place]  [address], [place] [address] Tel: [phone] Fax: [phone] Test Date:  10/28/2022  Patient: Sandra Payne DOB: June 16, 1941 Physician: Narda Amber, DO  Sex: Female Height: '4\' 10"'$  Ref Phys: Narda Amber, DO  ID#: [id number]   Technician:    History: This is a 82 year old female referred for evaluation of bilateral hand paresthesias.  NCV & EMG Findings: Extensive electrodiagnostic testing of the right upper extremity and additional studies of the left shows:  Bilateral mixed palmar sensory responses show prolonged latencies.  Bilateral median and ulnar sensory responses are within normal limits. Bilateral median and ulnar motor responses are within normal limits. Chronic motor axon loss changes are seen affecting the C5, C6, and C7 myotomes bilaterally, without accompanying active denervation.  Impression: Chronic multilevel radiculopathies affecting bilateral C5, C6, and C7 nerve root/segments, moderate.  These findings have progressed compared to prior study on 09/28/2018. Bilateral median neuropathy at or distal to the wrist, consistent with a clinical diagnosis of carpal tunnel syndrome.  Overall, these findings are very mild in degree electrically. 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   ___________________________ Narda Amber, DO    Nerve Conduction Studies   Stim Site NR Peak (ms) Norm Peak (ms) O-P Amp (V) Norm O-P Amp  Left Median Anti Sensory (2nd Digit)  32 C  Wrist    3.2 <3.8 34.5 >10  Right Median Anti Sensory (2nd Digit)  32 C  Wrist    3.5 <3.8 32.4 >10  Left Ulnar Anti Sensory (5th Digit)  32 C  Wrist    2.6 <3.2 21.1 >5  Right Ulnar Anti Sensory (5th Digit)  32 C  Wrist    2.6 <3.2 27.9 >5     Stim Site NR Onset (ms) Norm Onset (ms) O-P Amp (mV) Norm O-P Amp Site1 Site2 Delta-0 (ms) Dist (cm) Vel (m/s) Norm Vel (m/s)  Left Median Motor (Abd Poll Brev)  32 C   Wrist    2.9 <4.0 6.8 >5 Elbow Wrist 4.6 27.0 59 >50  Elbow    7.5  5.9         Right Median Motor (Abd Poll Brev)  32 C  Wrist    3.0 <4.0 7.2 >5 Elbow Wrist 4.6 25.0 54 >50  Elbow    7.6  6.5         Left Ulnar Motor (Abd Dig Minimi)  32 C  Wrist    2.0 <3.1 7.2 >7 B Elbow Wrist 3.4 19.0 56 >50  B Elbow    5.4  7.0  A Elbow B Elbow 1.8 10.0 56 >50  A Elbow    7.2  7.0         Right Ulnar Motor (Abd Dig Minimi)  32 C  Wrist    1.9 <3.1 8.5 >7 B Elbow Wrist 3.6 19.0 53 >50  B Elbow    5.5  8.4  A Elbow B Elbow 1.7 10.0 59 >50  A Elbow    7.2  8.0            Stim Site NR Peak (ms) Norm Peak (ms) P-T Amp (V) Site1 Site2 Delta-P (ms) Norm Delta (ms)  Left Median/Ulnar Palm Comparison (Wrist - 8cm)  32 C  Median Palm    0.9 <2.2 *4.9 Median Palm Ulnar Palm *0.7   Ulnar Palm    1.6 <2.2 11.9      Right Median/Ulnar Palm Comparison (  Wrist - 8cm)  32 C  Median Palm    2.0 <2.2 49.4 Median Palm Ulnar Palm *0.5   Ulnar Palm    1.5 <2.2 9.5       Electromyography   Side Muscle Ins.Act Fibs Fasc Recrt Amp Dur Poly Activation Comment  Right 1stDorInt Nml Nml Nml Nml Nml Nml Nml Nml N/A  Right Abd Poll Brev Nml Nml Nml Nml Nml Nml Nml Nml N/A  Right PronatorTeres Nml Nml Nml *1- *1+ *1+ *1+ Nml N/A  Right Biceps Nml Nml Nml *1- *1+ *1+ *1+ Nml N/A  Right Triceps Nml Nml Nml *1- *1+ *1+ *1+ Nml N/A  Right Deltoid Nml Nml Nml *1- *1+ *1+ *1+ Nml N/A  Left Abd Poll Brev Nml Nml Nml Nml Nml Nml Nml Nml N/A  Left 1stDorInt Nml Nml Nml Nml Nml Nml Nml Nml N/A  Left PronatorTeres Nml Nml Nml *1- *1+ *1+ *1+ Nml N/A  Left Biceps Nml Nml Nml *1- *1+ *1+ *1+ Nml N/A  Left Triceps Nml Nml Nml *1- *1+ *1+ *1+ Nml N/A  Left Deltoid Nml Nml Nml *1- *1+ *1+ *1+ Nml N/A      Waveforms:

## 2022-10-29 ENCOUNTER — Telehealth: Payer: Self-pay | Admitting: Podiatry

## 2022-10-29 DIAGNOSIS — R2689 Other abnormalities of gait and mobility: Secondary | ICD-10-CM | POA: Diagnosis not present

## 2022-10-29 DIAGNOSIS — M25512 Pain in left shoulder: Secondary | ICD-10-CM | POA: Diagnosis not present

## 2022-10-29 NOTE — Telephone Encounter (Signed)
Pt called checking status of medication that was to have been mailed to her, she has not heard anything. Upon checking the medication you had called in as sulfa in it and pt is allergic so [employer] had talked with the triage nurse about it.  Let me know and I can notify pt.

## 2022-11-01 ENCOUNTER — Other Ambulatory Visit: Payer: Self-pay | Admitting: Podiatry

## 2022-11-01 MED ORDER — CICLOPIROX 8 % EX SOLN: Freq: Every day | CUTANEOUS | 2 refills | Status: DC

## 2022-11-01 NOTE — Progress Notes (Signed)
Subjective: Chief Complaint  Patient presents with   Foot Problem    R 2nd toe has fungus   82 year old female presents the office today with above concerns.  She is concerned that her right second toe has fungus on the nail.  No drainage or pus or swelling or redness.  No open lesions.  She previously has tried Pharmacologist.  She also has a bunion that causes occasional discomfort.  No recent injuries.  Hurts with pressure and with certain shoes.   Objective: AAO x3, NAD DP/PT pulses palpable bilaterally, CRT less than 3 seconds Protective sensation intact with Simms Weinstein monofilament, vibratory sensation intact, right second nails hypertrophic, dystrophic with yellow, brown discoloration.  No edema, erythema or any significant hyperpigmentation.  No signs of infection. Bunions present with tenderness palpation directly on the medial first metatarsal head.  No crepitation MPJ range of motion.  There is no edema, erythema or signs of infection.  No crepitation. No pain with calf compression, swelling, warmth, erythema  Assessment: Onychomycosis, bunion  Plan: -All treatment options discussed with the patient including all alternatives, risks, complications.  -We discussed different treatment options for the nails as a courtesy debrided with any complications or bleeding.  I ordered a compound cream through [facility] to help with nail fungus. -For the bunion dispensed bunion pads.  Voltaren gel discussed shoe modifications.  If symptoms persist consider surgical intervention if needed. -Patient encouraged to call the office with any questions, concerns, change in symptoms.   Trula Slade DPM

## 2022-11-02 ENCOUNTER — Ambulatory Visit (INDEPENDENT_AMBULATORY_CARE_PROVIDER_SITE_OTHER): Payer: HMO | Admitting: Neurology

## 2022-11-02 ENCOUNTER — Encounter: Payer: Self-pay | Admitting: Neurology

## 2022-11-02 VITALS — BP 129/68 | HR 90 | Ht <= 58 in | Wt 145.0 lb

## 2022-11-02 DIAGNOSIS — M5412 Radiculopathy, cervical region: Secondary | ICD-10-CM

## 2022-11-02 DIAGNOSIS — G5603 Carpal tunnel syndrome, bilateral upper limbs: Secondary | ICD-10-CM | POA: Diagnosis not present

## 2022-11-02 NOTE — Patient Instructions (Signed)
Thank you for seeing me today.  We will check MRI cervical wo contrast.  Once I get the results, we will be in touch about the next steps.

## 2022-11-02 NOTE — Progress Notes (Unsigned)
Follow-up Visit   Date: 11/02/2022    Sandra Payne MRN: [id number] DOB: 1941-02-20    Sandra Payne is a 82 y.o. ***-handed Caucasian/*** female with *** returning to the clinic for follow-up of ***.  The patient was accompanied to the clinic by *** who also provides collateral information.    IMPRESSION/PLAN: Multilevel cervical radiculopathy based on EMG which shows impingement at bilateral C5, C6, and C7 nerve root/segments, moderate.  Hand paresthesias can certainly be secondary to this as her CTS is very mild.  She has completed PT with no benefit. - Proceed with MRI cervical spine wo contrast  - OK to use tizanidine '2mg'$  at bedtime  2.  Muscle cramps, improved  3.  Chemotherapy-induced neuropathy, improved  - She is on gabapentin '100mg'$  at bedtime, given that the dose is very low and she has minimal paresthesias, she may even want to try to stop and monitor.   - If pain returns, she can restart gabapentin   Return to clinic in ***  --------------------------------------------- History of present illness: Starting around the fall of 2023, she had severe muscle cramp of the right calf and bilateral feet (worse on the left) which is painful.  It lasts a few minutes and leaves the muscle sore.   She has tried mustard.  She has chronic low back pain and will be seeing orthopeadics for this.     She also reports having abnormal sensation and numbness of both hands.  She has left sided neck pain, but nothing that radiates down the arm.  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    Patient was diagnosed with diffuse large B cells lymphoma in 2020 and evaluated at the [facility] by Dr. Cecil Cobbs, neurooncologist, for chemotherapy-induced neuropathy.  She takes gabapentin '200mg'$  BID.    She lives alone in a three level home.  Husband passed in December 2015.   UPDATE  11/02/2022:***  Medications:  Current Outpatient Medications on File Prior to Visit  Medication Sig Dispense Refill   acetaminophen (TYLENOL) 325 MG tablet Take 650 mg by mouth every 6 (six) hours as needed for mild pain or headache.      albuterol (VENTOLIN HFA) 108 (90 Base) MCG/ACT inhaler Inhale 2 puffs into the lungs every 6 (six) hours as needed.     Azelastine HCl 0.15 % SOLN      calcium carbonate (TUMS - DOSED IN MG ELEMENTAL CALCIUM) 500 MG chewable tablet 2 tablets     celecoxib (CELEBREX) 200 MG capsule Take 200 mg by mouth every other day.     cetirizine (ZYRTEC) 10 MG tablet Take 10 mg by mouth daily.     ciclopirox (PENLAC) 8 % solution Apply topically at bedtime. Apply over nail and surrounding skin. Apply daily over previous coat. After seven (7) days, may remove with alcohol and continue cycle. (Patient not taking: Reported on 11/02/2022) 6.6 mL 2   gabapentin (NEURONTIN) 100 MG capsule TAKE 2 CAPSULES BY MOUTH 2 TIMES DAILY. (Patient taking differently: once. Take 100 mg at bedtime) 120 capsule 3   Multiple Vitamin (MULTIVITAMIN) tablet Take 1 tablet by mouth daily.     Omega 3 1000 MG CAPS      omeprazole (PRILOSEC) 40 MG capsule Take 40 mg by mouth daily.     rosuvastatin (CRESTOR) 10 MG tablet Take 10 mg by mouth daily. Take one tablet  on Monday, Wednesday, and Friday     tiZANidine (ZANAFLEX) 2 MG tablet Take 1 tablet (2 mg total) by mouth at bedtime. OK to take an extra tablet if needed. 30 tablet 5   zolpidem (AMBIEN) 10 MG tablet Take 5-10 mg by mouth at bedtime as needed.     No current facility-administered medications on file prior to visit.    Allergies:  Allergies  Allergen Reactions   Codeine Itching and Rash   Tramadol Hcl Other (See Comments)   Sulfa Antibiotics Other (See Comments) and Rash    Childhood allergy    Vital Signs:  BP 129/68   Pulse 90   Ht '4\' 10"'$  (1.473 m)   Wt 145 lb (65.8 kg)   SpO2 98%   BMI 30.31 kg/m    General Medical Exam:    General:  Well appearing, comfortable  Eyes/ENT: see cranial nerve examination.   Neck:  No carotid bruits. Respiratory:  Clear to auscultation, good air entry bilaterally.   Cardiac:  Regular rate and rhythm, no murmur.   Ext:  No edema ***  Neurological Exam: MENTAL STATUS including orientation to time, place, person, recent and remote memory, attention span and concentration, language, and fund of knowledge is ***normal.  Speech is not dysarthric.  CRANIAL NERVES:  No visual field defects.  Pupils equal round and reactive to light.  Normal conjugate, extra-ocular eye movements in all directions of gaze.  No ptosis ***.  Face is symmetric. Palate elevates symmetrically.  Tongue is midline.  MOTOR:  Motor strength is 5/5 in all extremities, ***.  No atrophy, fasciculations or abnormal movements.  No pronator drift.  Tone is normal.    MSRs:  Reflexes are 2+/4 throughout ***.  SENSORY:  Intact to vibration throughout ***.  COORDINATION/GAIT:  Normal finger-to- nose-finger.  Intact rapid alternating movements bilaterally.  Gait narrow based and stable.   Data: NCS/EMG of the arms 11/02/2022: Chronic multilevel radiculopathies affecting bilateral C5, C6, and C7 nerve root/segments, moderate.  These findings have progressed compared to prior study on 09/28/2018. Bilateral median neuropathy at or distal to the wrist, consistent with a clinical diagnosis of carpal tunnel syndrome.  Overall, these findings are very mild in degree electrically. 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     Thank you for allowing me to participate in patient's care.  If I can answer any additional questions, I would be pleased to do so.    Sincerely,    Puanani Gene K. Posey Pronto, DO

## 2022-11-03 DIAGNOSIS — M25512 Pain in left shoulder: Secondary | ICD-10-CM | POA: Diagnosis not present

## 2022-11-03 DIAGNOSIS — R2689 Other abnormalities of gait and mobility: Secondary | ICD-10-CM | POA: Diagnosis not present

## 2022-11-05 DIAGNOSIS — M25512 Pain in left shoulder: Secondary | ICD-10-CM | POA: Diagnosis not present

## 2022-11-05 DIAGNOSIS — R2689 Other abnormalities of gait and mobility: Secondary | ICD-10-CM | POA: Diagnosis not present

## 2022-11-09 ENCOUNTER — Telehealth: Payer: Self-pay

## 2022-11-09 NOTE — Telephone Encounter (Signed)
Patient Called stating that she was concerned about medication that was sent to her , states the first time it was sent she was allergic and the second time she states the insurance refuse to pay for it. Patient states is there anything else that can been sent to the pharmacy to solve her situation.    Please advise.[name] KitchenMarland KitchenMarland Kitchen

## 2022-11-11 ENCOUNTER — Telehealth: Payer: Self-pay | Admitting: Podiatry

## 2022-11-11 NOTE — Telephone Encounter (Signed)
Pt wanted to know what other Rx medication she can take since 1st Rx, she was allergic reaction to & the 2nd insurance denied. She asked about the Rx that was given to her back in 2020 if it possible to get that medication again. Please advise.

## 2022-11-12 NOTE — Telephone Encounter (Signed)
Patient states that her toe looks fine for now and will just reach back out to [place] if needed.

## 2022-11-22 ENCOUNTER — Encounter: Payer: Self-pay | Admitting: Neurology

## 2022-11-24 ENCOUNTER — Telehealth: Payer: Self-pay | Admitting: Podiatry

## 2022-11-24 NOTE — Telephone Encounter (Signed)
error 

## 2022-11-29 ENCOUNTER — Ambulatory Visit
Admission: RE | Admit: 2022-11-29 | Discharge: 2022-11-29 | Disposition: A | Discharge status: Home / Self Care | Payer: HMO | Source: Ambulatory Visit | Attending: Neurology | Admitting: Neurology

## 2022-11-29 DIAGNOSIS — G5603 Carpal tunnel syndrome, bilateral upper limbs: Secondary | ICD-10-CM

## 2022-11-29 DIAGNOSIS — R200 Anesthesia of skin: Secondary | ICD-10-CM | POA: Diagnosis not present

## 2022-11-29 DIAGNOSIS — R202 Paresthesia of skin: Secondary | ICD-10-CM | POA: Diagnosis not present

## 2022-11-29 DIAGNOSIS — M5412 Radiculopathy, cervical region: Secondary | ICD-10-CM

## 2022-11-29 DIAGNOSIS — M4802 Spinal stenosis, cervical region: Secondary | ICD-10-CM | POA: Diagnosis not present

## 2022-11-29 DIAGNOSIS — M47812 Spondylosis without myelopathy or radiculopathy, cervical region: Secondary | ICD-10-CM | POA: Diagnosis not present

## 2022-12-03 ENCOUNTER — Telehealth: Payer: Self-pay | Admitting: Neurology

## 2022-12-03 NOTE — Telephone Encounter (Signed)
I attempted to contact patient via phone today regarding the results of MRI cervical spine, however there was no answer so a message was left for the patient to return my call.   Imaging shows multiple levels where there is age-related changes of arthritis, narrowing, and disc degeneration, some of which is causing compression on the cord and nerves.  This would explain her hand numbness/tingling.  Management options include trial of neck PT and/or referral to spine specialist for their opinion (neurosurgery).

## 2022-12-06 NOTE — Telephone Encounter (Signed)
Called patient and left a message for a call back.  

## 2022-12-06 NOTE — Telephone Encounter (Signed)
Pt called returning Dr Eliane Decree call to get MRI results.

## 2022-12-07 NOTE — Telephone Encounter (Signed)
Patient is calling in, returning a call.

## 2022-12-08 NOTE — Telephone Encounter (Signed)
Sent patient a Mychart message.

## 2022-12-08 NOTE — Telephone Encounter (Signed)
Called patient back and unable to leave a message due to smart blocker screening calls and our number is not accepted.

## 2022-12-13 ENCOUNTER — Other Ambulatory Visit: Payer: Self-pay

## 2022-12-13 DIAGNOSIS — R202 Paresthesia of skin: Secondary | ICD-10-CM

## 2022-12-13 DIAGNOSIS — M5412 Radiculopathy, cervical region: Secondary | ICD-10-CM

## 2022-12-14 ENCOUNTER — Ambulatory Visit: Payer: HMO | Admitting: Physical Therapy

## 2022-12-17 ENCOUNTER — Ambulatory Visit: Payer: HMO | Attending: Neurology | Admitting: Physical Therapy

## 2022-12-17 DIAGNOSIS — R2689 Other abnormalities of gait and mobility: Secondary | ICD-10-CM

## 2022-12-17 DIAGNOSIS — R2681 Unsteadiness on feet: Secondary | ICD-10-CM | POA: Insufficient documentation

## 2022-12-17 DIAGNOSIS — M5412 Radiculopathy, cervical region: Secondary | ICD-10-CM | POA: Insufficient documentation

## 2022-12-17 DIAGNOSIS — M6281 Muscle weakness (generalized): Secondary | ICD-10-CM | POA: Diagnosis not present

## 2022-12-17 DIAGNOSIS — R202 Paresthesia of skin: Secondary | ICD-10-CM | POA: Diagnosis not present

## 2022-12-17 NOTE — Therapy (Signed)
OUTPATIENT PHYSICAL THERAPY CERVICAL EVALUATION   Patient Name: Sandra Payne MRN: [id number] DOB:1941/01/23, 82 y.o., female Today's Date: 12/17/2022  END OF SESSION:  PT End of Session - 12/17/22 1016     Visit Number 1    Number of Visits 13   with eval   Date for PT Re-Evaluation 02/11/23    Authorization Type [employer]    PT Start Time 1015    PT Stop Time 1058    PT Time Calculation (min) 43 min    Equipment Utilized During Treatment Gait belt    Activity Tolerance Patient tolerated treatment well    Behavior During Therapy WFL for tasks assessed/performed             Past Medical History:  Diagnosis Date   Anemia    Arthralgia    Arthritis    Dyspnea    increased exertion   Elevated cholesterol    History of radiation therapy 08/20/19- 09/24/19   lower left neck 25 fractions of 1.8 Gy to total 45 Gy.    Insomnia    Lymphoma (HCC)    Neck mass    left   Past Surgical History:  Procedure Laterality Date   ABDOMINAL HYSTERECTOMY     partial   IR IMAGING GUIDED PORT INSERTION  03/28/2019   IR REMOVAL TUN ACCESS W/ PORT W/O FL MOD SED  11/20/2019   MASS BIOPSY Left 03/12/2019   Procedure: NECK MASS BIOPSY;  Surgeon: Serena Colonel, MD;  Location: [facility];  Service: ENT;  Laterality: Left;   TOTAL KNEE ARTHROPLASTY Right 01/06/2015   Procedure: RIGHT TOTAL KNEE ARTHROPLASTY;  Surgeon: Gean Birchwood, MD;  Location: [facility] OR;  Service: Orthopedics;  Laterality: Right;   Patient Active Problem List   Diagnosis Date Noted   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   Dyspnea on exertion 05/28/2020   Upper airway cough syndrome 05/28/2020   Port-A-Cath in place 09/05/2019   Anemia due to antineoplastic chemotherapy 05/16/2019   DLBCL (diffuse large B cell lymphoma) (HCC) 03/19/2019   Arthritis of knee 01/06/2015   Primary osteoarthritis of right knee 01/05/2015   Abnormality of gait 12/03/2014   Benign paroxysmal positional vertigo  12/03/2014   Paresthesias 12/03/2014    PCP: Mila Palmer, MD  REFERRING PROVIDER: Glendale Chard, DO  REFERRING DIAG: [id number] (ICD-10-CM) - Cervical radiculopathy R20.2 (ICD-10-CM) - Paresthesia  THERAPY DIAG:  Muscle weakness (generalized)  Other abnormalities of gait and mobility  Unsteadiness on feet  Radiculopathy, cervical region  Rationale for Evaluation and Treatment: Rehabilitation  ONSET DATE: 12/13/2022  SUBJECTIVE:  SUBJECTIVE STATEMENT: Pt goes by "[name]"  Pt states in regards to her neck pain, "I'm assuming it's mainly the arthritis, I have soreness when turning my head". Pt reports she did just complete PT for her L shoulder and her core, was able to turn her head a little bit more after this therapy though it did not directly address her neck. Pt initially states that she does not have any N/T down her arms into her hands but then reports she DOES have N/T in her hands. Pt reports having neuropathy in her hands and reports difficulty with her grasp at times, drops things.  Pt also reports that her balance "does not feel 100%", wants to work on this as well.  Hand dominance: Right  PERTINENT HISTORY:  hyperlipidemia, insomnia, arthralgias, and diffuse large B cell lymphoma (2020), chemotherapy-induced neuropathy PAIN:  Are you having pain? Yes: NPRS scale: "bearable"/10 Pain location: neck Pain description: achy Aggravating factors: turning head, tilting head Relieving factors: Tylenol, Celebrex (every other day)  PRECAUTIONS: None  WEIGHT BEARING RESTRICTIONS: No  FALLS:  Has patient fallen in last 6 months? No  LIVING ENVIRONMENT: Lives with: lives alone  OCCUPATION: retired  PLOF: Independent with gait and Independent with transfers  PATIENT  GOALS: "I want to be able to not feel this so much"  NEXT MD VISIT: nothing scheduled   OBJECTIVE:   DIAGNOSTIC FINDINGS:   Cervical Spine MRI 12/01/22 IMPRESSION: 1. Multilevel cervical spondylosis, similar to previous CT from 02/19/2019. No acute osseous findings. 2. 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 3. Multilevel foraminal narrowing as described, greatest from C4-5 through C6-7. 4. Multilevel disc bulging and facet hypertrophy in the visualized upper thoracic spine.   NCS/EMG of the arms 11/02/2022: Chronic multilevel radiculopathies affecting bilateral C5, C6, and C7 nerve root/segments, moderate.  These findings have progressed compared to prior study on 09/28/2018. Bilateral median neuropathy at or distal to the wrist, consistent with a clinical diagnosis of carpal tunnel syndrome.  Overall, these findings are very mild in degree electrically. 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   PATIENT SURVEYS:  NDI 10/50  COGNITION: Overall cognitive status: Within functional limits for tasks assessed  SENSATION: Neuropathy in B hands, unsure if related to her history of chemotherapy, carpal tunnel, or cervical radiculopathy  POSTURE: rounded shoulders and forward head  PALPATION: Tightness in suboccipitals, upper trap; no tenderness to palpation  CERVICAL ROM:   Active ROM A/PROM (deg) eval  Flexion 30  Extension 35  Right lateral flexion 25  Left lateral flexion 17  Right rotation 60  Left rotation 38 (pain in neck)   (Blank rows = not tested)  UPPER EXTREMITY ROM:  Active ROM Right eval Left eval  Shoulder flexion Pain in shoulder at end range Pain in shoulder at end range  Shoulder extension    Shoulder abduction [facility]  Shoulder adduction    Shoulder extension    Shoulder internal rotation     Shoulder external rotation    Elbow flexion    Elbow extension    Wrist flexion    Wrist extension    Wrist ulnar deviation    Wrist radial deviation    Wrist pronation    Wrist supination     (Blank rows = not tested)  UPPER EXTREMITY MMT:  MMT Right eval Left eval  Shoulder flexion 4 4, onset  of throbbing after testing  Shoulder extension    Shoulder abduction 5 5  Shoulder adduction    Shoulder extension    Shoulder internal rotation    Shoulder external rotation    Middle trapezius    Lower trapezius    Elbow flexion    Elbow extension    Wrist flexion    Wrist extension    Wrist ulnar deviation    Wrist radial deviation    Wrist pronation    Wrist supination    Grip strength     (Blank rows = not tested)  CERVICAL SPECIAL TESTS:  Upper limb tension test (ULTT): Negative Right UE: Median: feels like it might cramp up Radial: feels like it might cramp up Ulnar: pulling in triceps  Left UE: Median: feels pulling, no increase in N/T Radial: no symptoms Ulnar: pain in L shoulder   FUNCTIONAL TESTS:    [facility] PT Assessment - 12/17/22 1055       Functional Gait  Assessment   Gait assessed  Yes    Gait Level Surface Walks 20 ft in less than 7 sec but greater than 5.5 sec, uses assistive device, slower speed, mild gait deviations, or deviates 6-10 in outside of the 12 in walkway width.    Change in Gait Speed Able to change speed, demonstrates mild gait deviations, deviates 6-10 in outside of the 12 in walkway width, or no gait deviations, unable to achieve a major change in velocity, or uses a change in velocity, or uses an assistive device.    Gait with Horizontal Head Turns Performs head turns with moderate changes in gait velocity, slows down, deviates 10-15 in outside 12 in walkway width but recovers, can continue to walk.    Gait with Vertical Head Turns Performs task with moderate change in gait velocity, slows down, deviates 10-15 in outside 12 in  walkway width but recovers, can continue to walk.    Gait and Pivot Turn Pivot turns safely in greater than 3 sec and stops with no loss of balance, or pivot turns safely within 3 sec and stops with mild imbalance, requires small steps to catch balance.    Step Over Obstacle Is able to step over one shoe box (4.5 in total height) without changing gait speed. No evidence of imbalance.    Gait with Narrow Base of Support Ambulates less than 4 steps heel to toe or cannot perform without assistance.    Gait with Eyes Closed Walks 20 ft, slow speed, abnormal gait pattern, evidence for imbalance, deviates 10-15 in outside 12 in walkway width. Requires more than 9 sec to ambulate 20 ft.    Ambulating Backwards Walks 20 ft, slow speed, abnormal gait pattern, evidence for imbalance, deviates 10-15 in outside 12 in walkway width.    Steps Alternating feet, must use rail.    Total Score 14    FGA comment: 14/30, high fall risk             TODAY'S TREATMENT:  PT Evaluation   PATIENT EDUCATION:  Education details: Eval findings, PT POC, results of OM and functional implications Person educated: Patient Education method: Explanation and Demonstration Education comprehension: verbalized understanding and needs further education  HOME EXERCISE PROGRAM: To be initiated  ASSESSMENT:  CLINICAL IMPRESSION: Patient is a 82 year old female referred to Neuro OPPT for cervical radiculopathy.   Pt's PMH is significant for: hyperlipidemia, insomnia, arthralgias,  diffuse large B cell lymphoma (2020), and chemotherapy-induced neuropathy. The following deficits were present during the exam: decreased cervical ROM, neck pain with rotation and lateral SB, and impaired balance. Based on her FGA score of 14/30, pt is an increased risk for falls. Pt would benefit from skilled PT to address these  impairments and functional limitations to maximize functional mobility independence.   OBJECTIVE IMPAIRMENTS: Abnormal gait, decreased balance, decreased knowledge of condition, decreased ROM, decreased strength, impaired perceived functional ability, impaired sensation, impaired UE functional use, improper body mechanics, postural dysfunction, and pain.   ACTIVITY LIMITATIONS: carrying, lifting, bending, standing, squatting, and stairs  PARTICIPATION LIMITATIONS: community activity  PERSONAL FACTORS: Age, Time since onset of injury/illness/exacerbation, and 1-2 comorbidities:   hyperlipidemia, insomnia, arthralgias, and diffuse large B cell lymphoma (2020), chemotherapy-induced neuropathy are also affecting patient's functional outcome.   REHAB POTENTIAL: Good  CLINICAL DECISION MAKING: Stable/uncomplicated  EVALUATION COMPLEXITY: Low   GOALS: Goals reviewed with patient? Yes  SHORT TERM GOALS: Target date: 01/07/2023   Pt will be independent with initial HEP for improved neck ROM, pain management, strength, balance, transfers and gait. Baseline:  Goal status: INITIAL  2.  Pt will improve FGA to 18/30 for decreased fall risk  Baseline: 14/30 (4/26) Goal status: INITIAL  3.  Pt will increase L cervical rotation by >/= 5 degrees with no increase in pain Baseline: 38 degrees and painful (4/26) Goal status: INITIAL   LONG TERM GOALS: Target date: 01/28/2023   Pt will be independent with final HEP for improved neck ROM, pain management, strength, balance, transfers and gait. Baseline:  Goal status: INITIAL  2.  Pt will improve FGA to 22/30 for decreased fall risk  Baseline: 14/30 (4/26) Goal status: INITIAL  3.  Pt will increase L cervical rotation by >/= 10 degrees with no increase in pain Baseline: 38 degrees and painful (4/26) Goal status: INITIAL  4.  Pt will increase L and R lateral flexion by >/= 10 degrees Baseline:  Right lateral flexion 25  Left lateral  flexion 17   Goal status: INITIAL  5.  Pt will improve score on NDI to </= 5/50 to demonstrate decreased disability level Baseline: 10/50 (4/26) Goal status: INITIAL     PLAN:  PT FREQUENCY: 2x/week  PT DURATION: 6 weeks  PLANNED INTERVENTIONS: Therapeutic exercises, Therapeutic activity, Neuromuscular re-education, Balance training, Gait training, Patient/Family education, Self Care, Joint mobilization, Stair training, Vestibular training, Canalith repositioning, Visual/preceptual remediation/compensation, DME instructions, Aquatic Therapy, Dry Needling, Electrical stimulation, Spinal manipulation, Spinal mobilization, Cryotherapy, Moist heat, Taping, Traction, Manual therapy, and Re-evaluation  PLAN FOR NEXT SESSION: initiate HEP for cervical and UT stretching as well as balance exercises (most difficulty with head turns and narrow BOS)   Peter Congo, PT, DPT, CSRS 12/17/2022, 10:58 AM

## 2022-12-20 ENCOUNTER — Ambulatory Visit: Payer: HMO | Admitting: Podiatry

## 2022-12-20 ENCOUNTER — Telehealth: Payer: Self-pay | Admitting: *Deleted

## 2022-12-20 NOTE — Telephone Encounter (Signed)
Patient is calling for the name of a medication that the physician had  recommended she can try, not having a flare up right now but would like to have the name.  Called the patient and left message of physician's medication recommendations and to call back if questions.

## 2022-12-22 DIAGNOSIS — F330 Major depressive disorder, recurrent, mild: Secondary | ICD-10-CM | POA: Diagnosis not present

## 2022-12-22 DIAGNOSIS — D509 Iron deficiency anemia, unspecified: Secondary | ICD-10-CM | POA: Diagnosis not present

## 2022-12-22 DIAGNOSIS — E538 Deficiency of other specified B group vitamins: Secondary | ICD-10-CM | POA: Diagnosis not present

## 2022-12-22 DIAGNOSIS — T7840XA Allergy, unspecified, initial encounter: Secondary | ICD-10-CM | POA: Diagnosis not present

## 2022-12-22 DIAGNOSIS — E559 Vitamin D deficiency, unspecified: Secondary | ICD-10-CM | POA: Diagnosis not present

## 2022-12-22 DIAGNOSIS — Z79899 Other long term (current) drug therapy: Secondary | ICD-10-CM | POA: Diagnosis not present

## 2022-12-22 DIAGNOSIS — G620 Drug-induced polyneuropathy: Secondary | ICD-10-CM | POA: Diagnosis not present

## 2022-12-22 DIAGNOSIS — K219 Gastro-esophageal reflux disease without esophagitis: Secondary | ICD-10-CM | POA: Diagnosis not present

## 2022-12-22 DIAGNOSIS — E785 Hyperlipidemia, unspecified: Secondary | ICD-10-CM | POA: Diagnosis not present

## 2022-12-22 DIAGNOSIS — I700 Atherosclerosis of aorta: Secondary | ICD-10-CM | POA: Diagnosis not present

## 2022-12-22 DIAGNOSIS — Z0000 Encounter for general adult medical examination without abnormal findings: Secondary | ICD-10-CM | POA: Diagnosis not present

## 2022-12-22 DIAGNOSIS — C8330 Diffuse large B-cell lymphoma, unspecified site: Secondary | ICD-10-CM | POA: Diagnosis not present

## 2022-12-27 ENCOUNTER — Ambulatory Visit: Payer: HMO | Admitting: Physical Therapy

## 2022-12-29 ENCOUNTER — Ambulatory Visit: Payer: HMO | Attending: Neurology | Admitting: Physical Therapy

## 2022-12-29 ENCOUNTER — Encounter: Payer: Self-pay | Admitting: Physical Therapy

## 2022-12-29 DIAGNOSIS — M5412 Radiculopathy, cervical region: Secondary | ICD-10-CM | POA: Insufficient documentation

## 2022-12-29 DIAGNOSIS — R2689 Other abnormalities of gait and mobility: Secondary | ICD-10-CM | POA: Diagnosis not present

## 2022-12-29 DIAGNOSIS — M6281 Muscle weakness (generalized): Secondary | ICD-10-CM | POA: Insufficient documentation

## 2022-12-29 DIAGNOSIS — R2681 Unsteadiness on feet: Secondary | ICD-10-CM | POA: Diagnosis not present

## 2022-12-29 NOTE — Therapy (Signed)
OUTPATIENT PHYSICAL THERAPY CERVICAL TREATMENT   Patient Name: Sandra Payne MRN: [id number] DOB:1941-06-29, 82 y.o., female Today's Date: 12/29/2022  END OF SESSION:  PT End of Session - 12/29/22 1106     Visit Number 2    Number of Visits 13   with eval   Date for PT Re-Evaluation 02/11/23    Authorization Type [employer]    PT Start Time 1104    PT Stop Time 1145    PT Time Calculation (min) 41 min    Equipment Utilized During Treatment Gait belt    Activity Tolerance Patient tolerated treatment well    Behavior During Therapy WFL for tasks assessed/performed             Past Medical History:  Diagnosis Date   Anemia    Arthralgia    Arthritis    Dyspnea    increased exertion   Elevated cholesterol    History of radiation therapy 08/20/19- 09/24/19   lower left neck 25 fractions of 1.8 Gy to total 45 Gy.    Insomnia    Lymphoma (HCC)    Neck mass    left   Past Surgical History:  Procedure Laterality Date   ABDOMINAL HYSTERECTOMY     partial   IR IMAGING GUIDED PORT INSERTION  03/28/2019   IR REMOVAL TUN ACCESS W/ PORT W/O FL MOD SED  11/20/2019   MASS BIOPSY Left 03/12/2019   Procedure: NECK MASS BIOPSY;  Surgeon: Sandra Colonel, MD;  Location: [facility];  Service: ENT;  Laterality: Left;   TOTAL KNEE ARTHROPLASTY Right 01/06/2015   Procedure: RIGHT TOTAL KNEE ARTHROPLASTY;  Surgeon: Gean Birchwood, MD;  Location: [facility];  Service: Orthopedics;  Laterality: Right;   Patient Active Problem List   Diagnosis Date Noted   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   Dyspnea on exertion 05/28/2020   Upper airway cough syndrome 05/28/2020   Port-A-Cath in place 09/05/2019   Anemia due to antineoplastic chemotherapy 05/16/2019   DLBCL (diffuse large B cell lymphoma) (HCC) 03/19/2019   Arthritis of knee 01/06/2015   Primary osteoarthritis of right knee 01/05/2015   Abnormality of gait 12/03/2014   Benign paroxysmal positional vertigo  12/03/2014   Paresthesias 12/03/2014    PCP: Mila Palmer, MD  REFERRING PROVIDER: Glendale Chard, DO  REFERRING DIAG: [phone] (ICD-10-CM) - Cervical radiculopathy R20.2 (ICD-10-CM) - Paresthesia  THERAPY DIAG:  Muscle weakness (generalized)  Other abnormalities of gait and mobility  Unsteadiness on feet  Radiculopathy, cervical region  Rationale for Evaluation and Treatment: Rehabilitation  ONSET DATE: 12/13/2022  SUBJECTIVE:  SUBJECTIVE STATEMENT: Pt goes by "[name]"  No falls, nothing new.    Hand dominance: Right  PERTINENT HISTORY:  hyperlipidemia, insomnia, arthralgias, and diffuse large B cell lymphoma (2020), chemotherapy-induced neuropathy PAIN:  Are you having pain? No pain today due to being on Tylenol.   PRECAUTIONS: None  WEIGHT BEARING RESTRICTIONS: No  FALLS:  Has patient fallen in last 6 months? No  LIVING ENVIRONMENT: Lives with: lives alone  OCCUPATION: retired  PLOF: Independent with gait and Independent with transfers  PATIENT GOALS: "I want to be able to not feel this so much"  NEXT MD VISIT: nothing scheduled   OBJECTIVE:   DIAGNOSTIC FINDINGS:   Cervical Spine MRI 12/01/22 IMPRESSION: 1. Multilevel cervical spondylosis, similar to previous CT from 02/19/2019. No acute osseous findings. 2. 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 3. Multilevel foraminal narrowing as described, greatest from C4-5 through C6-7. 4. Multilevel disc bulging and facet hypertrophy in the visualized upper thoracic spine.   NCS/EMG of the arms 11/02/2022: Chronic multilevel radiculopathies affecting bilateral C5, C6, and C7 nerve  root/segments, moderate.  These findings have progressed compared to prior study on 09/28/2018. Bilateral median neuropathy at or distal to the wrist, consistent with a clinical diagnosis of carpal tunnel syndrome.  Overall, these findings are very mild in degree electrically. 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   PATIENT SURVEYS:  NDI 10/50  COGNITION: Overall cognitive status: Within functional limits for tasks assessed  SENSATION: Neuropathy in B hands, unsure if related to her history of chemotherapy, carpal tunnel, or cervical radiculopathy  POSTURE: rounded shoulders and forward head  PALPATION: Tightness in suboccipitals, upper trap; no tenderness to palpation  CERVICAL ROM:   Active ROM A/PROM (deg) eval  Flexion 30  Extension 35  Right lateral flexion 25  Left lateral flexion 17  Right rotation 60  Left rotation 38 (pain in neck)   (Blank rows = not tested)  UPPER EXTREMITY ROM:  Active ROM Right eval Left eval  Shoulder flexion Pain in shoulder at end range Pain in shoulder at end range  Shoulder extension    Shoulder abduction [facility]  Shoulder adduction    Shoulder extension    Shoulder internal rotation    Shoulder external rotation    Elbow flexion    Elbow extension    Wrist flexion    Wrist extension    Wrist ulnar deviation    Wrist radial deviation    Wrist pronation    Wrist supination     (Blank rows = not tested)  UPPER EXTREMITY MMT:  MMT Right eval Left eval  Shoulder flexion 4 4, onset of throbbing after testing  Shoulder extension    Shoulder abduction 5 5   (Blank rows = not tested)  CERVICAL SPECIAL TESTS:  Upper limb tension test (ULTT): Negative Right UE: Median: feels like it might cramp up Radial: feels like it might cramp up Ulnar: pulling in triceps  Left UE: Median: feels pulling, no increase in N/T Radial: no symptoms Ulnar: pain in L shoulder    TODAY'S TREATMENT:  Therapeutic Exercise/NMR: SciFit with BUE/BLE at gear 2.0 for 5 minutes for aerobic exercise, circulation, and ROM   Access Code: [email or web address] URL: [email or web address] Date: 12/29/2022 Prepared by: Sherlie Ban  Initiated HEP, see MedBridge for further details.   Exercises - Seated Upper Trapezius Stretch  - 2 x daily - 7 x weekly - 3 sets - 30 hold - Gentle Levator Scapulae Stretch  - 2 x daily - 7 x weekly - 3 sets - 30 hold - Modified Cat Cow on Counter  - 2 x daily - 5 x weekly - 1-2 sets - 10 reps - Thoracic Rotation at Counter  - 2 x daily - 5 x weekly - 1 sets - 10 reps - Tandem Walking with Counter Support  - 1-2 x daily - 5 x weekly - 3 sets - Romberg Stance on Foam Pad with Head Rotation  - 2 x daily - 5 x weekly - 1 sets - 10 reps - also perform 10 reps head nods   PATIENT EDUCATION:  Education details: initiated HEP for balance, cervical mobility.  Person educated: Patient Education method: Explanation, Demonstration, Verbal cues, and Handouts Education comprehension: verbalized understanding, returned demonstration, and needs further education  HOME EXERCISE PROGRAM: Access Code: [facility] URL: [email or web address] Date: 12/29/2022 Prepared by: Sherlie Ban  Exercises - Seated Upper Trapezius Stretch  - 2 x daily - 7 x weekly - 3 sets - 30 hold - Gentle Levator Scapulae Stretch  - 2 x daily - 7 x weekly - 3 sets - 30 hold - Modified Cat Cow on Counter  - 2 x daily - 5 x weekly - 1-2 sets - 10 reps - Thoracic Rotation at Counter  - 2 x daily - 5 x weekly - 1 sets - 10 reps - Tandem Walking with Counter Support  - 1-2 x daily - 5 x weekly - 3 sets - Romberg Stance on Foam Pad with Head Rotation  - 2 x daily - 5 x weekly - 1 sets - 10 reps - also performed 10 reps head nods   ASSESSMENT:  CLINICAL  IMPRESSION: Today's skilled session focused on initiating HEP for balance with head motions, narrow BOS, cervical stretching and thoracic mobility. Pt tolerated session well with no reports of incr pain. Pt more challenged with balance tasks with head turns compared to head nods. Will continue to progress towards LTGs.    OBJECTIVE IMPAIRMENTS: Abnormal gait, decreased balance, decreased knowledge of condition, decreased ROM, decreased strength, impaired perceived functional ability, impaired sensation, impaired UE functional use, improper body mechanics, postural dysfunction, and pain.   ACTIVITY LIMITATIONS: carrying, lifting, bending, standing, squatting, and stairs  PARTICIPATION LIMITATIONS: community activity  PERSONAL FACTORS: Age, Time since onset of injury/illness/exacerbation, and 1-2 comorbidities:   hyperlipidemia, insomnia, arthralgias, and diffuse large B cell lymphoma (2020), chemotherapy-induced neuropathy are also affecting patient's functional outcome.   REHAB POTENTIAL: Good  CLINICAL DECISION MAKING: Stable/uncomplicated  EVALUATION COMPLEXITY: Low   GOALS: Goals reviewed with patient? Yes  SHORT TERM GOALS: Target date: 01/07/2023   Pt will be independent with initial HEP for improved neck ROM, pain management, strength, balance, transfers and gait. Baseline:  Goal status: INITIAL  2.  Pt will improve FGA to 18/30 for decreased fall risk  Baseline: 14/30 (4/26) Goal status: INITIAL  3.  Pt will increase L cervical rotation by >/= 5 degrees with no increase in pain Baseline: 38 degrees and painful (4/26) Goal status: INITIAL   LONG TERM GOALS: Target date: 01/28/2023  Pt will be independent with final HEP for improved neck ROM, pain management, strength, balance, transfers and gait. Baseline:  Goal status: INITIAL  2.  Pt will improve FGA to 22/30 for decreased fall risk  Baseline: 14/30 (4/26) Goal status: INITIAL  3.  Pt will increase L cervical  rotation by >/= 10 degrees with no increase in pain Baseline: 38 degrees and painful (4/26) Goal status: INITIAL  4.  Pt will increase L and R lateral flexion by >/= 10 degrees Baseline:  Right lateral flexion 25  Left lateral flexion 17   Goal status: INITIAL  5.  Pt will improve score on NDI to </= 5/50 to demonstrate decreased disability level Baseline: 10/50 (4/26) Goal status: INITIAL     PLAN:  PT FREQUENCY: 2x/week  PT DURATION: 6 weeks  PLANNED INTERVENTIONS: Therapeutic exercises, Therapeutic activity, Neuromuscular re-education, Balance training, Gait training, Patient/Family education, Self Care, Joint mobilization, Stair training, Vestibular training, Canalith repositioning, Visual/preceptual remediation/compensation, DME instructions, Aquatic Therapy, Dry Needling, Electrical stimulation, Spinal manipulation, Spinal mobilization, Cryotherapy, Moist heat, Taping, Traction, Manual therapy, and Re-evaluation  PLAN FOR NEXT SESSION: how is HEP? Cervical AROM, work on balance tasks with head motions and narrow BOS and unlevel surfaces     Drake Leach, PT, DPT 12/29/2022, 11:55 AM

## 2023-01-12 ENCOUNTER — Ambulatory Visit: Payer: HMO | Admitting: Physical Therapy

## 2023-01-12 DIAGNOSIS — R2681 Unsteadiness on feet: Secondary | ICD-10-CM

## 2023-01-12 DIAGNOSIS — R2689 Other abnormalities of gait and mobility: Secondary | ICD-10-CM

## 2023-01-12 DIAGNOSIS — M6281 Muscle weakness (generalized): Secondary | ICD-10-CM

## 2023-01-12 DIAGNOSIS — M5412 Radiculopathy, cervical region: Secondary | ICD-10-CM

## 2023-01-12 NOTE — Therapy (Addendum)
OUTPATIENT PHYSICAL THERAPY CERVICAL TREATMENT   Patient Name: ABEER HULTS MRN: [id number] DOB:Nov 27, 1940, 82 y.o., female Today's Date: 01/12/2023  END OF SESSION:  PT End of Session - 01/12/23 1155     Visit Number 3    Number of Visits 13   with eval   Date for PT Re-Evaluation 02/11/23    Authorization Type [employer]    PT Start Time 1155   pt arrived late   PT Stop Time 1235    PT Time Calculation (min) 40 min    Equipment Utilized During Treatment Gait belt    Activity Tolerance Patient tolerated treatment well    Behavior During Therapy WFL for tasks assessed/performed              Past Medical History:  Diagnosis Date   Anemia    Arthralgia    Arthritis    Dyspnea    increased exertion   Elevated cholesterol    History of radiation therapy 08/20/19- 09/24/19   lower left neck 25 fractions of 1.8 Gy to total 45 Gy.    Insomnia    Lymphoma (HCC)    Neck mass    left   Past Surgical History:  Procedure Laterality Date   ABDOMINAL HYSTERECTOMY     partial   IR IMAGING GUIDED PORT INSERTION  03/28/2019   IR REMOVAL TUN ACCESS W/ PORT W/O FL MOD SED  11/20/2019   MASS BIOPSY Left 03/12/2019   Procedure: NECK MASS BIOPSY;  Surgeon: Serena Colonel, MD;  Location: [facility];  Service: ENT;  Laterality: Left;   TOTAL KNEE ARTHROPLASTY Right 01/06/2015   Procedure: RIGHT TOTAL KNEE ARTHROPLASTY;  Surgeon: Gean Birchwood, MD;  Location: [facility];  Service: Orthopedics;  Laterality: Right;   Patient Active Problem List   Diagnosis Date Noted   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   Dyspnea on exertion 05/28/2020   Upper airway cough syndrome 05/28/2020   Port-A-Cath in place 09/05/2019   Anemia due to antineoplastic chemotherapy 05/16/2019   DLBCL (diffuse large B cell lymphoma) (HCC) 03/19/2019   Arthritis of knee 01/06/2015   Primary osteoarthritis of right knee 01/05/2015   Abnormality of gait 12/03/2014   Benign paroxysmal  positional vertigo 12/03/2014   Paresthesias 12/03/2014    PCP: Mila Palmer, MD  REFERRING PROVIDER: Glendale Chard, DO  REFERRING DIAG: [phone] (ICD-10-CM) - Cervical radiculopathy R20.2 (ICD-10-CM) - Paresthesia  THERAPY DIAG:  Muscle weakness (generalized)  Other abnormalities of gait and mobility  Unsteadiness on feet  Radiculopathy, cervical region  Rationale for Evaluation and Treatment: Rehabilitation  ONSET DATE: 12/13/2022  SUBJECTIVE:  SUBJECTIVE STATEMENT: Pt goes by "[name]"  Pt reports no "aches and pains today". No symptoms down into her arm. Pt reports that her neck is feeling good today because of medication. She is also doing exercise classes 2x/week. Pt reports that some days are better than others but overall her balance doesn't seem to be so bad today. Pt reports she has only been able to do her exercises one time since they were prescribed since she went out of town for [employer] day.  Hand dominance: Right  PERTINENT HISTORY:  hyperlipidemia, insomnia, arthralgias, and diffuse large B cell lymphoma (2020), chemotherapy-induced neuropathy PAIN:  Are you having pain? No pain today due to being on Tylenol.   PRECAUTIONS: None  WEIGHT BEARING RESTRICTIONS: No  FALLS:  Has patient fallen in last 6 months? No  LIVING ENVIRONMENT: Lives with: lives alone  OCCUPATION: retired  PLOF: Independent with gait and Independent with transfers  PATIENT GOALS: "I want to be able to not feel this so much"  NEXT MD VISIT: nothing scheduled   OBJECTIVE:   DIAGNOSTIC FINDINGS:   Cervical Spine MRI 12/01/22 IMPRESSION: 1. Multilevel cervical spondylosis, similar to previous CT from 02/19/2019. No acute osseous findings. 2. Multilevel chronic cord deformity  with associated chronic myelomalacia, most advanced at C4-5 and C5-6 where there is moderate to severe spinal stenosis. 3. Multilevel foraminal narrowing as described, greatest from C4-5 through C6-7. 4. Multilevel disc bulging and facet hypertrophy in the visualized upper thoracic spine.   NCS/EMG of the arms 11/02/2022: Chronic multilevel radiculopathies affecting bilateral C5, C6, and C7 nerve root/segments, moderate.  These findings have progressed compared to prior study on 09/28/2018. Bilateral median neuropathy at or distal to the wrist, consistent with a clinical diagnosis of carpal tunnel syndrome.  Overall, these findings are very mild in degree electrically. 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   PATIENT SURVEYS:  NDI 10/50  COGNITION: Overall cognitive status: Within functional limits for tasks assessed  SENSATION: Neuropathy in B hands, unsure if related to her history of chemotherapy, carpal tunnel, or cervical radiculopathy  POSTURE: rounded shoulders and forward head  PALPATION: Tightness in suboccipitals, upper trap; no tenderness to palpation  CERVICAL ROM:   Active ROM A/PROM (deg) eval AROM (deg) 01/12/23  Flexion 30   Extension 35   Right lateral flexion 25 15 (painful)  Left lateral flexion 17 15 (painful)  Right rotation 60 60  Left rotation 38 (pain in neck) 40 (painful in neck)   (Blank rows = not tested)  UPPER EXTREMITY ROM:  Active ROM Right eval Left eval  Shoulder flexion Pain in shoulder at end range Pain in shoulder at end range  Shoulder extension    Shoulder abduction [facility]  Shoulder adduction    Shoulder extension    Shoulder internal rotation    Shoulder external rotation    Elbow flexion    Elbow extension    Wrist flexion    Wrist extension    Wrist ulnar deviation    Wrist radial deviation    Wrist pronation    Wrist supination     (Blank rows = not tested)  UPPER EXTREMITY  MMT:  MMT Right eval Left eval  Shoulder flexion 4 4, onset of throbbing after testing  Shoulder extension    Shoulder abduction 5 5   (Blank rows = not tested)  CERVICAL SPECIAL TESTS:  Upper limb tension test (ULTT): Negative Right UE: Median: feels like it might  cramp up Radial: feels like it might cramp up Ulnar: pulling in triceps  Left UE: Median: feels pulling, no increase in N/T Radial: no symptoms Ulnar: pain in L shoulder    TODAY'S TREATMENT:                                                                                                                               Therapeutic Exercise: Seated cervical rotation stretch with towel 3 x 30 sec each B  Added to HEP, see bolded below   TherAct: For STG assessment:  [facility] PT Assessment - 01/12/23 1210       Functional Gait  Assessment   Gait assessed  Yes    Gait Level Surface Walks 20 ft in less than 7 sec but greater than 5.5 sec, uses assistive device, slower speed, mild gait deviations, or deviates 6-10 in outside of the 12 in walkway width.    Change in Gait Speed Able to smoothly change walking speed without loss of balance or gait deviation. Deviate no more than 6 in outside of the 12 in walkway width.    Gait with Horizontal Head Turns Performs head turns with moderate changes in gait velocity, slows down, deviates 10-15 in outside 12 in walkway width but recovers, can continue to walk.    Gait with Vertical Head Turns Performs task with slight change in gait velocity (eg, minor disruption to smooth gait path), deviates 6 - 10 in outside 12 in walkway width or uses assistive device    Gait and Pivot Turn Pivot turns safely in greater than 3 sec and stops with no loss of balance, or pivot turns safely within 3 sec and stops with mild imbalance, requires small steps to catch balance.    Step Over Obstacle Is able to step over one shoe box (4.5 in total height) without changing gait speed. No evidence of  imbalance.    Gait with Narrow Base of Support Ambulates 7-9 steps.    Gait with Eyes Closed Walks 20 ft, slow speed, abnormal gait pattern, evidence for imbalance, deviates 10-15 in outside 12 in walkway width. Requires more than 9 sec to ambulate 20 ft.    Ambulating Backwards Walks 20 ft, uses assistive device, slower speed, mild gait deviations, deviates 6-10 in outside 12 in walkway width.    Steps Alternating feet, must use rail.    Total Score 19    FGA comment: 19/30, high fall risk            Reassessed cervical ROM (see above)  In // bars with no UE support to work on static standing balance: Romberg stance 3 x 30 sec each with EC, min A for balance Romberg stance 3 x 30 sec each with horizontal head turns, CGA for balance Pt does have slight dizziness  PATIENT EDUCATION:  Education details: added to HEP, results of STG assessment Person educated: Patient Education method: Explanation, Demonstration, Verbal cues, and  Handouts Education comprehension: verbalized understanding, returned demonstration, and needs further education  HOME EXERCISE PROGRAM: Access Code: [facility] URL: [email or web address] Date: 12/29/2022 Prepared by: Sherlie Ban  Exercises - Seated Upper Trapezius Stretch  - 2 x daily - 7 x weekly - 3 sets - 30 hold - Gentle Levator Scapulae Stretch  - 2 x daily - 7 x weekly - 3 sets - 30 hold - Modified Cat Cow on Counter  - 2 x daily - 5 x weekly - 1-2 sets - 10 reps - Thoracic Rotation at Counter  - 2 x daily - 5 x weekly - 1 sets - 10 reps - Tandem Walking with Counter Support  - 1-2 x daily - 5 x weekly - 3 sets - Romberg Stance on Foam Pad with Head Rotation  - 2 x daily - 5 x weekly - 1 sets - 10 reps - also performed 10 reps head nods  - Seated Assisted Cervical Rotation with Towel  - 1 x daily - 7 x weekly - 1 sets - 3-5 reps - 30 sec hold  ASSESSMENT:  CLINICAL IMPRESSION: Emphasis of skilled PT session on assessing STG,  adding to HEP, and continuing to work on static standing balance with EC and with horizontal head turns. Pt has met 2/3 STG due to being independent with her initial HEP and improving her score on the FGA from 14/30 to 19/30, demonstrating improved balance. Pt is making progress towards improving her cervical AROM and did improve L rotation from 38 degrees to 40 degrees. Pt continues to exhibit the most challenge with standing balance tasks with EC and with horizontal head turns, continues to benefit from skilled therapy services to improve this and decrease her fall risk. Continue POC.    OBJECTIVE IMPAIRMENTS: Abnormal gait, decreased balance, decreased knowledge of condition, decreased ROM, decreased strength, impaired perceived functional ability, impaired sensation, impaired UE functional use, improper body mechanics, postural dysfunction, and pain.   ACTIVITY LIMITATIONS: carrying, lifting, bending, standing, squatting, and stairs  PARTICIPATION LIMITATIONS: community activity  PERSONAL FACTORS: Age, Time since onset of injury/illness/exacerbation, and 1-2 comorbidities:   hyperlipidemia, insomnia, arthralgias, and diffuse large B cell lymphoma (2020), chemotherapy-induced neuropathy are also affecting patient's functional outcome.   REHAB POTENTIAL: Good  CLINICAL DECISION MAKING: Stable/uncomplicated  EVALUATION COMPLEXITY: Low   GOALS: Goals reviewed with patient? Yes  SHORT TERM GOALS: Target date: 01/07/2023   Pt will be independent with initial HEP for improved neck ROM, pain management, strength, balance, transfers and gait. Baseline:  Goal status: MET  2.  Pt will improve FGA to 18/30 for decreased fall risk  Baseline: 14/30 (4/26), 19/30 (5/22) Goal status: MET  3.  Pt will increase L cervical rotation by >/= 5 degrees with no increase in pain Baseline: 38 degrees and painful (4/26), 40 degrees and painful (5/22) Goal status: IN PROGRESS   LONG TERM GOALS: Target  date: 01/28/2023   Pt will be independent with final HEP for improved neck ROM, pain management, strength, balance, transfers and gait. Baseline:  Goal status: INITIAL  2.  Pt will improve FGA to 22/30 for decreased fall risk  Baseline: 14/30 (4/26), 19/30 (5/21) Goal status: INITIAL  3.  Pt will increase L cervical rotation by >/= 10 degrees with no increase in pain Baseline: 38 degrees and painful (4/26), 40 degrees and painful (5/22) Goal status: INITIAL  4.  Pt will increase L and R lateral flexion by >/= 10 degrees Baseline:  Right lateral flexion 25  Left lateral flexion 17   Goal status: INITIAL  5.  Pt will improve score on NDI to </= 5/50 to demonstrate decreased disability level Baseline: 10/50 (4/26) Goal status: INITIAL     PLAN:  PT FREQUENCY: 2x/week  PT DURATION: 6 weeks  PLANNED INTERVENTIONS: Therapeutic exercises, Therapeutic activity, Neuromuscular re-education, Balance training, Gait training, Patient/Family education, Self Care, Joint mobilization, Stair training, Vestibular training, Canalith repositioning, Visual/preceptual remediation/compensation, DME instructions, Aquatic Therapy, Dry Needling, Electrical stimulation, Spinal manipulation, Spinal mobilization, Cryotherapy, Moist heat, Taping, Traction, Manual therapy, and Re-evaluation  PLAN FOR NEXT SESSION: how is HEP? Cervical AROM, work on balance tasks with head motions (horizontal) and narrow BOS and unlevel surfaces and EC   Peter Congo, PT, DPT, CSRS 01/12/2023, 12:40 PM

## 2023-01-14 ENCOUNTER — Ambulatory Visit: Payer: HMO | Admitting: Physical Therapy

## 2023-01-14 DIAGNOSIS — R2681 Unsteadiness on feet: Secondary | ICD-10-CM

## 2023-01-14 DIAGNOSIS — M6281 Muscle weakness (generalized): Secondary | ICD-10-CM | POA: Diagnosis not present

## 2023-01-14 DIAGNOSIS — M5412 Radiculopathy, cervical region: Secondary | ICD-10-CM

## 2023-01-14 DIAGNOSIS — R2689 Other abnormalities of gait and mobility: Secondary | ICD-10-CM

## 2023-01-14 NOTE — Therapy (Signed)
OUTPATIENT PHYSICAL THERAPY CERVICAL TREATMENT   Patient Name: Sandra Payne MRN: [id number] DOB:Oct 19, 1940, 82 y.o., female Today's Date: 01/14/2023  END OF SESSION:  PT End of Session - 01/14/23 1103     Visit Number 4    Number of Visits 13   with eval   Date for PT Re-Evaluation 02/11/23    Authorization Type [employer]    PT Start Time 1102    PT Stop Time 1144    PT Time Calculation (min) 42 min    Equipment Utilized During Treatment Gait belt    Activity Tolerance Patient tolerated treatment well    Behavior During Therapy WFL for tasks assessed/performed               Past Medical History:  Diagnosis Date   Anemia    Arthralgia    Arthritis    Dyspnea    increased exertion   Elevated cholesterol    History of radiation therapy 08/20/19- 09/24/19   lower left neck 25 fractions of 1.8 Gy to total 45 Gy.    Insomnia    Lymphoma (HCC)    Neck mass    left   Past Surgical History:  Procedure Laterality Date   ABDOMINAL HYSTERECTOMY     partial   IR IMAGING GUIDED PORT INSERTION  03/28/2019   IR REMOVAL TUN ACCESS W/ PORT W/O FL MOD SED  11/20/2019   MASS BIOPSY Left 03/12/2019   Procedure: NECK MASS BIOPSY;  Surgeon: Sandra Colonel, MD;  Location: [facility];  Service: ENT;  Laterality: Left;   TOTAL KNEE ARTHROPLASTY Right 01/06/2015   Procedure: RIGHT TOTAL KNEE ARTHROPLASTY;  Surgeon: Sandra Birchwood, MD;  Location: [facility];  Service: Orthopedics;  Laterality: Right;   Patient Active Problem List   Diagnosis Date Noted   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   Dyspnea on exertion 05/28/2020   Upper airway cough syndrome 05/28/2020   Port-A-Cath in place 09/05/2019   Anemia due to antineoplastic chemotherapy 05/16/2019   DLBCL (diffuse large B cell lymphoma) (HCC) 03/19/2019   Arthritis of knee 01/06/2015   Primary osteoarthritis of right knee 01/05/2015   Abnormality of gait 12/03/2014   Benign paroxysmal positional  vertigo 12/03/2014   Paresthesias 12/03/2014    PCP: Sandra Palmer, MD  REFERRING PROVIDER: Glendale Chard, DO  REFERRING DIAG: [phone] (ICD-10-CM) - Cervical radiculopathy R20.2 (ICD-10-CM) - Paresthesia  THERAPY DIAG:  Muscle weakness (generalized)  Other abnormalities of gait and mobility  Unsteadiness on feet  Radiculopathy, cervical region  Rationale for Evaluation and Treatment: Rehabilitation  ONSET DATE: 12/13/2022  SUBJECTIVE:  SUBJECTIVE STATEMENT: Pt goes by "Sandra Payne"  Pt reports no "pain pain" today. No neck pain or pain down her arm.  Hand dominance: Right  PERTINENT HISTORY:  hyperlipidemia, insomnia, arthralgias, and diffuse large B cell lymphoma (2020), chemotherapy-induced neuropathy PAIN:  Are you having pain? No pain today due to being on Tylenol.   PRECAUTIONS: None  WEIGHT BEARING RESTRICTIONS: No  FALLS:  Has patient fallen in last 6 months? No  LIVING ENVIRONMENT: Lives with: lives alone  OCCUPATION: retired  PLOF: Independent with gait and Independent with transfers  PATIENT GOALS: "I want to be able to not feel this so much"  NEXT MD VISIT: nothing scheduled   OBJECTIVE:   DIAGNOSTIC FINDINGS:   Cervical Spine MRI 12/01/22 IMPRESSION: 1. Multilevel cervical spondylosis, similar to previous CT from 02/19/2019. No acute osseous findings. 2. 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 3. Multilevel foraminal narrowing as described, greatest from C4-5 through C6-7. 4. Multilevel disc bulging and facet hypertrophy in the visualized upper thoracic spine.   NCS/EMG of the arms 11/02/2022: Chronic multilevel radiculopathies  affecting bilateral C5, C6, and C7 nerve root/segments, moderate.  These findings have progressed compared to prior study on 09/28/2018. Bilateral median neuropathy at or distal to the wrist, consistent with a clinical diagnosis of carpal tunnel syndrome.  Overall, these findings are very mild in degree electrically. 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   PATIENT SURVEYS:  NDI 10/50  COGNITION: Overall cognitive status: Within functional limits for tasks assessed  SENSATION: Neuropathy in B hands, unsure if related to her history of chemotherapy, carpal tunnel, or cervical radiculopathy  POSTURE: rounded shoulders and forward head  PALPATION: Tightness in suboccipitals, upper trap; no tenderness to palpation  CERVICAL ROM:   Active ROM A/PROM (deg) eval AROM (deg) 01/12/23  Flexion 30   Extension 35   Right lateral flexion 25 15 (painful)  Left lateral flexion 17 15 (painful)  Right rotation 60 60  Left rotation 38 (pain in neck) 40 (painful in neck)   (Blank rows = not tested)  UPPER EXTREMITY ROM:  Active ROM Right eval Left eval  Shoulder flexion Pain in shoulder at end range Pain in shoulder at end range  Shoulder extension    Shoulder abduction [facility]  Shoulder adduction    Shoulder extension    Shoulder internal rotation    Shoulder external rotation    Elbow flexion    Elbow extension    Wrist flexion    Wrist extension    Wrist ulnar deviation    Wrist radial deviation    Wrist pronation    Wrist supination     (Blank rows = not tested)  UPPER EXTREMITY MMT:  MMT Right eval Left eval  Shoulder flexion 4 4, onset of throbbing after testing  Shoulder extension    Shoulder abduction 5 5   (Blank rows = not tested)  CERVICAL SPECIAL TESTS:  Upper limb tension test (ULTT): Negative Right UE: Median: feels like it might cramp up Radial: feels like it might cramp up Ulnar: pulling in triceps  Left  UE: Median: feels pulling, no increase in N/T Radial: no symptoms Ulnar: pain in L shoulder    TODAY'S TREATMENT:  TherAct: Ambulation in therapy gym with CGA to min A with various challenges to work on balance: Ambulation 6 x 25 ft with horizontal head turns  Some ataxia and some path deviation (no dizziness) Tandem gait 4 x 25 ft with min A for balance, Initial 2 x 25 ft with several LOB needing min A to correct; much improved performance for last 2 x 25 ft Obstacle course: Ambulation across uneven blue mat with alt L/R gumdrop taps with CGA to min A, one instance of LOB requiring cross over stepping and min A to recover and prevent fall. Added in 4" hurdle step-overs and static stance on airex with EC x 30 sec each. Added in 6" step up/down, sidesteps on uneven blue mat with alt L/R gumdrop taps Forwards stepping over 4" hurdles 6 x 10 ft  PATIENT EDUCATION:  Education details: continue HEP Person educated: Patient Education method: Explanation and Verbal cues Education comprehension: verbalized understanding and needs further education  HOME EXERCISE PROGRAM: Access Code: [email or web address] URL: [email or web address] Date: 12/29/2022 Prepared by: Sherlie Ban  Exercises - Seated Upper Trapezius Stretch  - 2 x daily - 7 x weekly - 3 sets - 30 hold - Gentle Levator Scapulae Stretch  - 2 x daily - 7 x weekly - 3 sets - 30 hold - Modified Cat Cow on Counter  - 2 x daily - 5 x weekly - 1-2 sets - 10 reps - Thoracic Rotation at Counter  - 2 x daily - 5 x weekly - 1 sets - 10 reps - Tandem Walking with Counter Support  - 1-2 x daily - 5 x weekly - 3 sets - Romberg Stance on Foam Pad with Head Rotation  - 2 x daily - 5 x weekly - 1 sets - 10 reps - also performed 10 reps head nods  - Seated Assisted Cervical Rotation with Towel  - 1 x daily - 7 x  weekly - 1 sets - 3-5 reps - 30 sec hold   ASSESSMENT:  CLINICAL IMPRESSION: Emphasis of skilled PT session on continuing to work on challenging balance to work on patient's ongoing balance impairments. Pt continues to exhibit ongoing difficulty maintaining her balance with horizontal head turns, narrow BOS, EC, and on unlevel surfaces. Pt continues to benefit from skilled therapy services to work towards improving her balance with various challenges in order to decrease her fall risk and to work on management of cervical pain/symptoms. Continue POC.    OBJECTIVE IMPAIRMENTS: Abnormal gait, decreased balance, decreased knowledge of condition, decreased ROM, decreased strength, impaired perceived functional ability, impaired sensation, impaired UE functional use, improper body mechanics, postural dysfunction, and pain.   ACTIVITY LIMITATIONS: carrying, lifting, bending, standing, squatting, and stairs  PARTICIPATION LIMITATIONS: community activity  PERSONAL FACTORS: Age, Time since onset of injury/illness/exacerbation, and 1-2 comorbidities:   hyperlipidemia, insomnia, arthralgias, and diffuse large B cell lymphoma (2020), chemotherapy-induced neuropathy are also affecting patient's functional outcome.   REHAB POTENTIAL: Good  CLINICAL DECISION MAKING: Stable/uncomplicated  EVALUATION COMPLEXITY: Low   GOALS: Goals reviewed with patient? Yes  SHORT TERM GOALS: Target date: 01/07/2023   Pt will be independent with initial HEP for improved neck ROM, pain management, strength, balance, transfers and gait. Baseline:  Goal status: MET  2.  Pt will improve FGA to 18/30 for decreased fall risk  Baseline: 14/30 (4/26), 19/30 (5/22) Goal status: MET  3.  Pt will increase L cervical rotation by >/= 5 degrees with no increase in pain Baseline: 38 degrees and  painful (4/26), 40 degrees and painful (5/22) Goal status: IN PROGRESS   LONG TERM GOALS: Target date: 01/28/2023   Pt will be  independent with final HEP for improved neck ROM, pain management, strength, balance, transfers and gait. Baseline:  Goal status: INITIAL  2.  Pt will improve FGA to 22/30 for decreased fall risk  Baseline: 14/30 (4/26), 19/30 (5/21) Goal status: INITIAL  3.  Pt will increase L cervical rotation by >/= 10 degrees with no increase in pain Baseline: 38 degrees and painful (4/26), 40 degrees and painful (5/22) Goal status: INITIAL  4.  Pt will increase L and R lateral flexion by >/= 10 degrees Baseline:  Right lateral flexion 25  Left lateral flexion 17   Goal status: INITIAL  5.  Pt will improve score on NDI to </= 5/50 to demonstrate decreased disability level Baseline: 10/50 (4/26) Goal status: INITIAL     PLAN:  PT FREQUENCY: 2x/week  PT DURATION: 6 weeks  PLANNED INTERVENTIONS: Therapeutic exercises, Therapeutic activity, Neuromuscular re-education, Balance training, Gait training, Patient/Family education, Self Care, Joint mobilization, Stair training, Vestibular training, Canalith repositioning, Visual/preceptual remediation/compensation, DME instructions, Aquatic Therapy, Dry Needling, Electrical stimulation, Spinal manipulation, Spinal mobilization, Cryotherapy, Moist heat, Taping, Traction, Manual therapy, and Re-evaluation  PLAN FOR NEXT SESSION: how is HEP? Cervical AROM, work on balance tasks with head motions (horizontal) and narrow BOS and unlevel surfaces and EC   Peter Congo, PT, DPT, CSRS 01/14/2023, 11:45 AM

## 2023-01-19 ENCOUNTER — Ambulatory Visit: Payer: HMO | Admitting: Physical Therapy

## 2023-01-19 DIAGNOSIS — M6281 Muscle weakness (generalized): Secondary | ICD-10-CM

## 2023-01-19 DIAGNOSIS — R2681 Unsteadiness on feet: Secondary | ICD-10-CM

## 2023-01-19 DIAGNOSIS — M5412 Radiculopathy, cervical region: Secondary | ICD-10-CM

## 2023-01-19 DIAGNOSIS — R2689 Other abnormalities of gait and mobility: Secondary | ICD-10-CM

## 2023-01-19 NOTE — Therapy (Signed)
OUTPATIENT PHYSICAL THERAPY CERVICAL TREATMENT   Patient Name: TRANISE TUAZON MRN: [id number] DOB:1940-09-27, 82 y.o., female Today's Date: 01/19/2023  END OF SESSION:  PT End of Session - 01/19/23 1021     Visit Number 5    Number of Visits 13   with eval   Date for PT Re-Evaluation 02/11/23    Authorization Type [employer]    PT Start Time 1020   pt arrived late   PT Stop Time 1102    PT Time Calculation (min) 42 min    Equipment Utilized During Treatment Gait belt    Activity Tolerance Patient tolerated treatment well    Behavior During Therapy WFL for tasks assessed/performed                Past Medical History:  Diagnosis Date   Anemia    Arthralgia    Arthritis    Dyspnea    increased exertion   Elevated cholesterol    History of radiation therapy 08/20/19- 09/24/19   lower left neck 25 fractions of 1.8 Gy to total 45 Gy.    Insomnia    Lymphoma (HCC)    Neck mass    left   Past Surgical History:  Procedure Laterality Date   ABDOMINAL HYSTERECTOMY     partial   IR IMAGING GUIDED PORT INSERTION  03/28/2019   IR REMOVAL TUN ACCESS W/ PORT W/O FL MOD SED  11/20/2019   MASS BIOPSY Left 03/12/2019   Procedure: NECK MASS BIOPSY;  Surgeon: Serena Colonel, MD;  Location: [facility];  Service: ENT;  Laterality: Left;   TOTAL KNEE ARTHROPLASTY Right 01/06/2015   Procedure: RIGHT TOTAL KNEE ARTHROPLASTY;  Surgeon: Gean Birchwood, MD;  Location: [facility];  Service: Orthopedics;  Laterality: Right;   Patient Active Problem List   Diagnosis Date Noted   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   Dyspnea on exertion 05/28/2020   Upper airway cough syndrome 05/28/2020   Port-A-Cath in place 09/05/2019   Anemia due to antineoplastic chemotherapy 05/16/2019   DLBCL (diffuse large B cell lymphoma) (HCC) 03/19/2019   Arthritis of knee 01/06/2015   Primary osteoarthritis of right knee 01/05/2015   Abnormality of gait 12/03/2014   Benign  paroxysmal positional vertigo 12/03/2014   Paresthesias 12/03/2014    PCP: Mila Palmer, MD  REFERRING PROVIDER: Glendale Chard, DO  REFERRING DIAG: [phone] (ICD-10-CM) - Cervical radiculopathy R20.2 (ICD-10-CM) - Paresthesia  THERAPY DIAG:  Muscle weakness (generalized)  Other abnormalities of gait and mobility  Unsteadiness on feet  Radiculopathy, cervical region  Rationale for Evaluation and Treatment: Rehabilitation  ONSET DATE: 12/13/2022  SUBJECTIVE:  SUBJECTIVE STATEMENT: Pt goes by "[name]"  Pt did have some pain in her L arm this morning, but also hasn't been taking her medicine regularly. Pt finds she has the most trouble when turning her head to back up out of her driveway. Pt reports that regarding her balance she notices most of her issues at the end of the day, after sitting down to rest she will try to get back up and feels very "wobbly".  Hand dominance: Right  PERTINENT HISTORY:  hyperlipidemia, insomnia, arthralgias, and diffuse large B cell lymphoma (2020), chemotherapy-induced neuropathy PAIN:  Are you having pain? No pain today due to being on Tylenol.   PRECAUTIONS: None  WEIGHT BEARING RESTRICTIONS: No  FALLS:  Has patient fallen in last 6 months? No  LIVING ENVIRONMENT: Lives with: lives alone  OCCUPATION: retired  PLOF: Independent with gait and Independent with transfers  PATIENT GOALS: "I want to be able to not feel this so much"  NEXT MD VISIT: nothing scheduled   OBJECTIVE:   DIAGNOSTIC FINDINGS:   Cervical Spine MRI 12/01/22 IMPRESSION: 1. Multilevel cervical spondylosis, similar to previous CT from 02/19/2019. No acute osseous findings. 2. 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 3. Multilevel foraminal narrowing as described, greatest from C4-5 through C6-7. 4. Multilevel disc bulging and facet hypertrophy in the visualized upper thoracic spine.   NCS/EMG of the arms 11/02/2022: Chronic multilevel radiculopathies affecting bilateral C5, C6, and C7 nerve root/segments, moderate.  These findings have progressed compared to prior study on 09/28/2018. Bilateral median neuropathy at or distal to the wrist, consistent with a clinical diagnosis of carpal tunnel syndrome.  Overall, these findings are very mild in degree electrically. 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   PATIENT SURVEYS:  NDI 10/50  COGNITION: Overall cognitive status: Within functional limits for tasks assessed  SENSATION: Neuropathy in B hands, unsure if related to her history of chemotherapy, carpal tunnel, or cervical radiculopathy  POSTURE: rounded shoulders and forward head  PALPATION: Tightness in suboccipitals, upper trap; no tenderness to palpation  CERVICAL ROM:   Active ROM A/PROM (deg) eval AROM (deg) 01/12/23 AROM (deg) 01/19/23  Flexion 30    Extension 35    Right lateral flexion 25 15 (painful)   Left lateral flexion 17 15 (painful)   Right rotation 60 60   Left rotation 38 (pain in neck) 40 (painful in neck) 45   (Blank rows = not tested)  UPPER EXTREMITY ROM:  Active ROM Right eval Left eval  Shoulder flexion Pain in shoulder at end range Pain in shoulder at end range  Shoulder extension    Shoulder abduction [facility]  Shoulder adduction    Shoulder extension    Shoulder internal rotation    Shoulder external rotation    Elbow flexion    Elbow extension    Wrist flexion    Wrist extension    Wrist ulnar deviation    Wrist radial deviation    Wrist pronation    Wrist supination     (Blank rows = not tested)  UPPER EXTREMITY MMT:  MMT Right eval  Left eval  Shoulder flexion 4 4, onset of throbbing after testing  Shoulder extension    Shoulder abduction 5 5   (Blank rows = not tested)  CERVICAL SPECIAL TESTS:  Upper limb tension test (ULTT): Negative Right UE: Median: feels like  it might cramp up Radial: feels like it might cramp up Ulnar: pulling in triceps  Left UE: Median: feels pulling, no increase in N/T Radial: no symptoms Ulnar: pain in L shoulder    TODAY'S TREATMENT:                                                                                                                               TherAct: To work on stepping strategies and balance recovery: Resisted gait with green band x 115 ft with CGA for balance Resisted gait with multidirectional perturbations x 230 ft with CGA pt exhibits good use of stepping strategy and/or widening her BOS to recover balance, 2 instances of cross-over stepping  Gait with horizontal head turns 6 x 50 ft with focus on increased amplitude of cervical rotation and focus on increasing speed Red light green light x 115 ft with CGA for balance. Pt exhibits good ability to recover balance with sudden stops/starts.  TherEx: Reviewed seated cervical rotation stretch as pt having difficulty performing it correctly and still remains limited in her cervical rotation.  PATIENT EDUCATION:  Education details: continue HEP Person educated: Patient Education method: Explanation and Verbal cues Education comprehension: verbalized understanding and needs further education  HOME EXERCISE PROGRAM: Access Code: [email or web address] URL: [email or web address] Date: 12/29/2022 Prepared by: Sherlie Ban  Exercises - Seated Upper Trapezius Stretch  - 2 x daily - 7 x weekly - 3 sets - 30 hold - Gentle Levator Scapulae Stretch  - 2 x daily - 7 x weekly - 3 sets - 30 hold - Modified Cat Cow on Counter  - 2 x daily - 5 x weekly - 1-2 sets - 10 reps - Thoracic Rotation at Counter  - 2 x  daily - 5 x weekly - 1 sets - 10 reps - Tandem Walking with Counter Support  - 1-2 x daily - 5 x weekly - 3 sets - Romberg Stance on Foam Pad with Head Rotation  - 2 x daily - 5 x weekly - 1 sets - 10 reps - also performed 10 reps head nods  - Seated Assisted Cervical Rotation with Towel  - 1 x daily - 7 x weekly - 1 sets - 3-5 reps - 30 sec hold   ASSESSMENT:  CLINICAL IMPRESSION: Emphasis of skilled PT session on continuing to work on improving cervical ROM and pain management and working on dynamic standing balance. Pt continues to exhibit decreased cervical rotation ROM though it is improved from initial evaluation and an overall improvement in pain and radiculopathy symptoms. Pt does continue to exhibit impaired balance with higher level dynamic tasks and continues to benefit from skilled therapy services to work towards LTGs. Continue POC.    OBJECTIVE IMPAIRMENTS: Abnormal gait, decreased balance, decreased knowledge of condition, decreased ROM, decreased strength, impaired perceived functional ability, impaired sensation, impaired UE functional use, improper body mechanics, postural dysfunction, and pain.   ACTIVITY LIMITATIONS: carrying, lifting, bending, standing,  squatting, and stairs  PARTICIPATION LIMITATIONS: community activity  PERSONAL FACTORS: Age, Time since onset of injury/illness/exacerbation, and 1-2 comorbidities:   hyperlipidemia, insomnia, arthralgias, and diffuse large B cell lymphoma (2020), chemotherapy-induced neuropathy are also affecting patient's functional outcome.   REHAB POTENTIAL: Good  CLINICAL DECISION MAKING: Stable/uncomplicated  EVALUATION COMPLEXITY: Low   GOALS: Goals reviewed with patient? Yes  SHORT TERM GOALS: Target date: 01/07/2023   Pt will be independent with initial HEP for improved neck ROM, pain management, strength, balance, transfers and gait. Baseline:  Goal status: MET  2.  Pt will improve FGA to 18/30 for decreased fall  risk  Baseline: 14/30 (4/26), 19/30 (5/22) Goal status: MET  3.  Pt will increase L cervical rotation by >/= 5 degrees with no increase in pain Baseline: 38 degrees and painful (4/26), 40 degrees and painful (5/22) Goal status: IN PROGRESS   LONG TERM GOALS: Target date: 01/28/2023   Pt will be independent with final HEP for improved neck ROM, pain management, strength, balance, transfers and gait. Baseline:  Goal status: INITIAL  2.  Pt will improve FGA to 22/30 for decreased fall risk  Baseline: 14/30 (4/26), 19/30 (5/21) Goal status: INITIAL  3.  Pt will increase L cervical rotation by >/= 10 degrees with no increase in pain Baseline: 38 degrees and painful (4/26), 40 degrees and painful (5/22) Goal status: INITIAL  4.  Pt will increase L and R lateral flexion by >/= 10 degrees Baseline:  Right lateral flexion 25  Left lateral flexion 17   Goal status: INITIAL  5.  Pt will improve score on NDI to </= 5/50 to demonstrate decreased disability level Baseline: 10/50 (4/26) Goal status: INITIAL     PLAN:  PT FREQUENCY: 2x/week  PT DURATION: 6 weeks  PLANNED INTERVENTIONS: Therapeutic exercises, Therapeutic activity, Neuromuscular re-education, Balance training, Gait training, Patient/Family education, Self Care, Joint mobilization, Stair training, Vestibular training, Canalith repositioning, Visual/preceptual remediation/compensation, DME instructions, Aquatic Therapy, Dry Needling, Electrical stimulation, Spinal manipulation, Spinal mobilization, Cryotherapy, Moist heat, Taping, Traction, Manual therapy, and Re-evaluation  PLAN FOR NEXT SESSION: how is HEP? Cervical AROM, work on balance tasks with head motions (horizontal) and narrow BOS and unlevel surfaces and EC   Peter Congo, PT, DPT, CSRS 01/19/2023, 11:03 AM

## 2023-01-21 ENCOUNTER — Encounter: Payer: Self-pay | Admitting: Physical Therapy

## 2023-01-21 ENCOUNTER — Ambulatory Visit: Payer: HMO | Admitting: Physical Therapy

## 2023-01-21 DIAGNOSIS — M6281 Muscle weakness (generalized): Secondary | ICD-10-CM | POA: Diagnosis not present

## 2023-01-21 DIAGNOSIS — R2689 Other abnormalities of gait and mobility: Secondary | ICD-10-CM

## 2023-01-21 DIAGNOSIS — R2681 Unsteadiness on feet: Secondary | ICD-10-CM

## 2023-01-21 NOTE — Therapy (Signed)
OUTPATIENT PHYSICAL THERAPY CERVICAL TREATMENT   Patient Name: Sandra Payne MRN: [id number] DOB:03-02-1941, 82 y.o., female Today's Date: 01/21/2023  END OF SESSION:  PT End of Session - 01/21/23 1107     Visit Number 6    Number of Visits 13   with eval   Date for PT Re-Evaluation 02/11/23    Authorization Type [employer]    PT Start Time 1105   PT running late   PT Stop Time 1146    PT Time Calculation (min) 41 min    Equipment Utilized During Treatment Gait belt    Activity Tolerance Patient tolerated treatment well    Behavior During Therapy WFL for tasks assessed/performed                Past Medical History:  Diagnosis Date   Anemia    Arthralgia    Arthritis    Dyspnea    increased exertion   Elevated cholesterol    History of radiation therapy 08/20/19- 09/24/19   lower left neck 25 fractions of 1.8 Gy to total 45 Gy.    Insomnia    Lymphoma (HCC)    Neck mass    left   Past Surgical History:  Procedure Laterality Date   ABDOMINAL HYSTERECTOMY     partial   IR IMAGING GUIDED PORT INSERTION  03/28/2019   IR REMOVAL TUN ACCESS W/ PORT W/O FL MOD SED  11/20/2019   MASS BIOPSY Left 03/12/2019   Procedure: NECK MASS BIOPSY;  Surgeon: Serena Colonel, MD;  Location: [facility];  Service: ENT;  Laterality: Left;   TOTAL KNEE ARTHROPLASTY Right 01/06/2015   Procedure: RIGHT TOTAL KNEE ARTHROPLASTY;  Surgeon: Gean Birchwood, MD;  Location: [facility];  Service: Orthopedics;  Laterality: Right;   Patient Active Problem List   Diagnosis Date Noted   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   Dyspnea on exertion 05/28/2020   Upper airway cough syndrome 05/28/2020   Port-A-Cath in place 09/05/2019   Anemia due to antineoplastic chemotherapy 05/16/2019   DLBCL (diffuse large B cell lymphoma) (HCC) 03/19/2019   Arthritis of knee 01/06/2015   Primary osteoarthritis of right knee 01/05/2015   Abnormality of gait 12/03/2014   Benign  paroxysmal positional vertigo 12/03/2014   Paresthesias 12/03/2014    PCP: Mila Palmer, MD  REFERRING PROVIDER: Glendale Chard, DO  REFERRING DIAG: [phone] (ICD-10-CM) - Cervical radiculopathy R20.2 (ICD-10-CM) - Paresthesia  THERAPY DIAG:  Muscle weakness (generalized)  Other abnormalities of gait and mobility  Unsteadiness on feet  Rationale for Evaluation and Treatment: Rehabilitation  ONSET DATE: 12/13/2022  SUBJECTIVE:  SUBJECTIVE STATEMENT: Pt goes by "Sandra Payne"  Felt like she ate too much for breakfast this morning. Pt notices her balance is good when she is here doing it. Reports neck is feeling sore. Wants to keep working on more of the balance today.   Hand dominance: Right  PERTINENT HISTORY:  hyperlipidemia, insomnia, arthralgias, and diffuse large B cell lymphoma (2020), chemotherapy-induced neuropathy PAIN:  Are you having pain? No pain today due to being on Tylenol.   PRECAUTIONS: None  WEIGHT BEARING RESTRICTIONS: No  FALLS:  Has patient fallen in last 6 months? No  LIVING ENVIRONMENT: Lives with: lives alone  OCCUPATION: retired  PLOF: Independent with gait and Independent with transfers  PATIENT GOALS: "I want to be able to not feel this so much"  NEXT MD VISIT: nothing scheduled   OBJECTIVE:   DIAGNOSTIC FINDINGS:   Cervical Spine MRI 12/01/22 IMPRESSION: 1. Multilevel cervical spondylosis, similar to previous CT from 02/19/2019. No acute osseous findings. 2. 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 3. Multilevel foraminal narrowing as described, greatest from C4-5 through C6-7. 4. Multilevel disc bulging and facet  hypertrophy in the visualized upper thoracic spine.   NCS/EMG of the arms 11/02/2022: Chronic multilevel radiculopathies affecting bilateral C5, C6, and C7 nerve root/segments, moderate.  These findings have progressed compared to prior study on 09/28/2018. Bilateral median neuropathy at or distal to the wrist, consistent with a clinical diagnosis of carpal tunnel syndrome.  Overall, these findings are very mild in degree electrically. 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   PATIENT SURVEYS:  NDI 10/50  COGNITION: Overall cognitive status: Within functional limits for tasks assessed  SENSATION: Neuropathy in B hands, unsure if related to her history of chemotherapy, carpal tunnel, or cervical radiculopathy  POSTURE: rounded shoulders and forward head  PALPATION: Tightness in suboccipitals, upper trap; no tenderness to palpation  CERVICAL ROM:   Active ROM A/PROM (deg) eval AROM (deg) 01/12/23 AROM (deg) 01/19/23  Flexion 30    Extension 35    Right lateral flexion 25 15 (painful)   Left lateral flexion 17 15 (painful)   Right rotation 60 60   Left rotation 38 (pain in neck) 40 (painful in neck) 45   (Blank rows = not tested)  UPPER EXTREMITY ROM:  Active ROM Right eval Left eval  Shoulder flexion Pain in shoulder at end range Pain in shoulder at end range  Shoulder extension    Shoulder abduction [facility]  Shoulder adduction    Shoulder extension    Shoulder internal rotation    Shoulder external rotation    Elbow flexion    Elbow extension    Wrist flexion    Wrist extension    Wrist ulnar deviation    Wrist radial deviation    Wrist pronation    Wrist supination     (Blank rows = not tested)  UPPER EXTREMITY MMT:  MMT Right eval Left eval  Shoulder flexion 4 4, onset of throbbing after testing  Shoulder extension    Shoulder abduction 5 5   (Blank rows = not tested)  CERVICAL SPECIAL TESTS:  Upper limb tension  test (ULTT): Negative Right UE: Median: feels like it might cramp up Radial: feels like it might cramp up Ulnar: pulling in triceps  Left UE: Median: feels pulling, no increase in N/T Radial: no symptoms Ulnar: pain in L shoulder  TODAY'S TREATMENT:                                                                                                                               NMR:  Gait around track 3 x 115' with head turns to R/L, pt with no LOB, did well turning head to read cards. Needing a seated rest break after due to fatigue.  On blue foam beam: Tandem gait down and back x3 reps with intermittent UE support for balance  Alternating SLS taps x10 reps each side, x8 reps each side forward and cross body tap to cone, intermittent taps to bars for balance  Alternating forward stepping strategy with head turns to R/L, x10 reps each side, pt with good cervical AROM and no neck pain With air ex on 4" step, alternating forward step up with contralateral march x10 reps each side with no UE support, more challenge with SLS on LLE  On BOSU with black side up: Weight shifting laterally side to side x10 reps each side, intermittent support for balance Mini squats x10 reps, needing cues for proper mini squat technique On rockerboard in A/P direction: weight shifting for limits of stability x10 reps, more difficulty with balance in posterior direction.    PATIENT EDUCATION:  Education details: continue HEP Person educated: Patient Education method: Explanation and Verbal cues Education comprehension: verbalized understanding and needs further education  HOME EXERCISE PROGRAM: Access Code: [email or web address] URL: [email or web address] Date: 12/29/2022 Prepared by: Sherlie Ban  Exercises - Seated Upper Trapezius Stretch  - 2 x daily - 7 x weekly - 3 sets - 30 hold - Gentle Levator Scapulae Stretch  - 2 x daily - 7 x weekly - 3 sets - 30 hold - Modified Cat Cow on Counter  - 2 x  daily - 5 x weekly - 1-2 sets - 10 reps - Thoracic Rotation at Counter  - 2 x daily - 5 x weekly - 1 sets - 10 reps - Tandem Walking with Counter Support  - 1-2 x daily - 5 x weekly - 3 sets - Romberg Stance on Foam Pad with Head Rotation  - 2 x daily - 5 x weekly - 1 sets - 10 reps - also performed 10 reps head nods  - Seated Assisted Cervical Rotation with Towel  - 1 x daily - 7 x weekly - 1 sets - 3-5 reps - 30 sec hold   ASSESSMENT:  CLINICAL IMPRESSION: Today's skilled session continued to focus on balance strategies with head motions and tasks on compliant surfaces working on weight shifting and SLS. Pt reporting no incr of neck pain during session with normal cervical AROM. Pt more challenged with SLS tasks on LLE. Pt needing seated rest breaks during session due to fatigue. Continue POC.    OBJECTIVE IMPAIRMENTS: Abnormal gait, decreased balance, decreased knowledge of condition, decreased ROM, decreased strength, impaired perceived functional ability, impaired sensation, impaired UE functional use, improper body mechanics,  postural dysfunction, and pain.   ACTIVITY LIMITATIONS: carrying, lifting, bending, standing, squatting, and stairs  PARTICIPATION LIMITATIONS: community activity  PERSONAL FACTORS: Age, Time since onset of injury/illness/exacerbation, and 1-2 comorbidities:   hyperlipidemia, insomnia, arthralgias, and diffuse large B cell lymphoma (2020), chemotherapy-induced neuropathy are also affecting patient's functional outcome.   REHAB POTENTIAL: Good  CLINICAL DECISION MAKING: Stable/uncomplicated  EVALUATION COMPLEXITY: Low   GOALS: Goals reviewed with patient? Yes  SHORT TERM GOALS: Target date: 01/07/2023   Pt will be independent with initial HEP for improved neck ROM, pain management, strength, balance, transfers and gait. Baseline:  Goal status: MET  2.  Pt will improve FGA to 18/30 for decreased fall risk  Baseline: 14/30 (4/26), 19/30 (5/22) Goal  status: MET  3.  Pt will increase L cervical rotation by >/= 5 degrees with no increase in pain Baseline: 38 degrees and painful (4/26), 40 degrees and painful (5/22) Goal status: IN PROGRESS   LONG TERM GOALS: Target date: 01/28/2023   Pt will be independent with final HEP for improved neck ROM, pain management, strength, balance, transfers and gait. Baseline:  Goal status: INITIAL  2.  Pt will improve FGA to 22/30 for decreased fall risk  Baseline: 14/30 (4/26), 19/30 (5/21) Goal status: INITIAL  3.  Pt will increase L cervical rotation by >/= 10 degrees with no increase in pain Baseline: 38 degrees and painful (4/26), 40 degrees and painful (5/22) Goal status: INITIAL  4.  Pt will increase L and R lateral flexion by >/= 10 degrees Baseline:  Right lateral flexion 25  Left lateral flexion 17   Goal status: INITIAL  5.  Pt will improve score on NDI to </= 5/50 to demonstrate decreased disability level Baseline: 10/50 (4/26) Goal status: INITIAL     PLAN:  PT FREQUENCY: 2x/week  PT DURATION: 6 weeks  PLANNED INTERVENTIONS: Therapeutic exercises, Therapeutic activity, Neuromuscular re-education, Balance training, Gait training, Patient/Family education, Self Care, Joint mobilization, Stair training, Vestibular training, Canalith repositioning, Visual/preceptual remediation/compensation, DME instructions, Aquatic Therapy, Dry Needling, Electrical stimulation, Spinal manipulation, Spinal mobilization, Cryotherapy, Moist heat, Taping, Traction, Manual therapy, and Re-evaluation  PLAN FOR NEXT SESSION: how is HEP? Cervical AROM, work on balance tasks with head motions (horizontal) and narrow BOS and unlevel surfaces and EC   Drake Leach, PT, DPT 01/21/2023, 12:08 PM

## 2023-01-24 ENCOUNTER — Ambulatory Visit: Payer: HMO | Attending: Neurology | Admitting: Physical Therapy

## 2023-01-24 ENCOUNTER — Encounter: Payer: Self-pay | Admitting: Physical Therapy

## 2023-01-24 DIAGNOSIS — M6281 Muscle weakness (generalized): Secondary | ICD-10-CM | POA: Insufficient documentation

## 2023-01-24 DIAGNOSIS — R2689 Other abnormalities of gait and mobility: Secondary | ICD-10-CM | POA: Diagnosis not present

## 2023-01-24 DIAGNOSIS — M5412 Radiculopathy, cervical region: Secondary | ICD-10-CM | POA: Insufficient documentation

## 2023-01-24 DIAGNOSIS — R2681 Unsteadiness on feet: Secondary | ICD-10-CM | POA: Diagnosis not present

## 2023-01-24 NOTE — Therapy (Signed)
OUTPATIENT PHYSICAL THERAPY CERVICAL TREATMENT   Patient Name: Sandra Payne MRN: [id number] DOB:Feb 28, 1941, 82 y.o., female Today's Date: 01/24/2023  END OF SESSION:  PT End of Session - 01/24/23 1108     Visit Number 7    Number of Visits 13   with eval   Date for PT Re-Evaluation 02/11/23    Authorization Type [employer]    PT Start Time 1106   pt late to session   PT Stop Time 1145    PT Time Calculation (min) 39 min    Equipment Utilized During Treatment Gait belt    Activity Tolerance Patient tolerated treatment well    Behavior During Therapy WFL for tasks assessed/performed                Past Medical History:  Diagnosis Date   Anemia    Arthralgia    Arthritis    Dyspnea    increased exertion   Elevated cholesterol    History of radiation therapy 08/20/19- 09/24/19   lower left neck 25 fractions of 1.8 Gy to total 45 Gy.    Insomnia    Lymphoma (HCC)    Neck mass    left   Past Surgical History:  Procedure Laterality Date   ABDOMINAL HYSTERECTOMY     partial   IR IMAGING GUIDED PORT INSERTION  03/28/2019   IR REMOVAL TUN ACCESS W/ PORT W/O FL MOD SED  11/20/2019   MASS BIOPSY Left 03/12/2019   Procedure: NECK MASS BIOPSY;  Surgeon: Serena Colonel, MD;  Location: [facility];  Service: ENT;  Laterality: Left;   TOTAL KNEE ARTHROPLASTY Right 01/06/2015   Procedure: RIGHT TOTAL KNEE ARTHROPLASTY;  Surgeon: Gean Birchwood, MD;  Location: [facility];  Service: Orthopedics;  Laterality: Right;   Patient Active Problem List   Diagnosis Date Noted   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   Dyspnea on exertion 05/28/2020   Upper airway cough syndrome 05/28/2020   Port-A-Cath in place 09/05/2019   Anemia due to antineoplastic chemotherapy 05/16/2019   DLBCL (diffuse large B cell lymphoma) (HCC) 03/19/2019   Arthritis of knee 01/06/2015   Primary osteoarthritis of right knee 01/05/2015   Abnormality of gait 12/03/2014   Benign  paroxysmal positional vertigo 12/03/2014   Paresthesias 12/03/2014    PCP: Mila Palmer, MD  REFERRING PROVIDER: Glendale Chard, DO  REFERRING DIAG: [phone] (ICD-10-CM) - Cervical radiculopathy R20.2 (ICD-10-CM) - Paresthesia  THERAPY DIAG:  Muscle weakness (generalized)  Other abnormalities of gait and mobility  Unsteadiness on feet  Rationale for Evaluation and Treatment: Rehabilitation  ONSET DATE: 12/13/2022  SUBJECTIVE:  SUBJECTIVE STATEMENT: Pt goes by "Sandra Payne"  No changes since she was last here, had a good weekend. Felt good after Friday. Did some of her exercises.   Hand dominance: Right  PERTINENT HISTORY:  hyperlipidemia, insomnia, arthralgias, and diffuse large B cell lymphoma (2020), chemotherapy-induced neuropathy PAIN:  Are you having pain? No pain today due to being on Tylenol.   PRECAUTIONS: None  WEIGHT BEARING RESTRICTIONS: No  FALLS:  Has patient fallen in last 6 months? No  LIVING ENVIRONMENT: Lives with: lives alone  OCCUPATION: retired  PLOF: Independent with gait and Independent with transfers  PATIENT GOALS: "I want to be able to not feel this so much"  NEXT MD VISIT: nothing scheduled   OBJECTIVE:   DIAGNOSTIC FINDINGS:   Cervical Spine MRI 12/01/22 IMPRESSION: 1. Multilevel cervical spondylosis, similar to previous CT from 02/19/2019. No acute osseous findings. 2. 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 3. Multilevel foraminal narrowing as described, greatest from C4-5 through C6-7. 4. Multilevel disc bulging and facet hypertrophy in the visualized upper thoracic spine.   NCS/EMG of the arms 11/02/2022: Chronic  multilevel radiculopathies affecting bilateral C5, C6, and C7 nerve root/segments, moderate.  These findings have progressed compared to prior study on 09/28/2018. Bilateral median neuropathy at or distal to the wrist, consistent with a clinical diagnosis of carpal tunnel syndrome.  Overall, these findings are very mild in degree electrically. 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   PATIENT SURVEYS:  NDI 10/50  COGNITION: Overall cognitive status: Within functional limits for tasks assessed  SENSATION: Neuropathy in B hands, unsure if related to her history of chemotherapy, carpal tunnel, or cervical radiculopathy  POSTURE: rounded shoulders and forward head  PALPATION: Tightness in suboccipitals, upper trap; no tenderness to palpation  CERVICAL ROM:   Active ROM A/PROM (deg) eval AROM (deg) 01/12/23 AROM (deg) 01/19/23  Flexion 30    Extension 35    Right lateral flexion 25 15 (painful)   Left lateral flexion 17 15 (painful)   Right rotation 60 60   Left rotation 38 (pain in neck) 40 (painful in neck) 45   (Blank rows = not tested)  UPPER EXTREMITY ROM:  Active ROM Right eval Left eval  Shoulder flexion Pain in shoulder at end range Pain in shoulder at end range  Shoulder extension    Shoulder abduction [facility]  Shoulder adduction    Shoulder extension    Shoulder internal rotation    Shoulder external rotation    Elbow flexion    Elbow extension    Wrist flexion    Wrist extension    Wrist ulnar deviation    Wrist radial deviation    Wrist pronation    Wrist supination     (Blank rows = not tested)  UPPER EXTREMITY MMT:  MMT Right eval Left eval  Shoulder flexion 4 4, onset of throbbing after testing  Shoulder extension    Shoulder abduction 5 5   (Blank rows = not tested)  CERVICAL SPECIAL TESTS:  Upper limb tension test (ULTT): Negative Right UE: Median: feels like it might cramp up Radial: feels like it might  cramp up Ulnar: pulling in triceps  Left UE: Median: feels pulling, no increase in N/T Radial: no symptoms Ulnar: pain in L shoulder    TODAY'S TREATMENT:  AROM R cervical rotation: 56 degrees L cervical rotation: 53 degrees   NMR:   Standing Balance: Surface: Airex Position: Narrow Base of Support Completed with: Eyes Closed; 3 x 30 seconds  With wider BOS: EC x10 reps head turns, x10 reps head nods   Single Leg Stance:   Surface:  On green balance disc, modified SLS  Lower Extremity: BLE  Time: Performed 2 x 10 reps head turns, alternating legs  Tandem Stance:  Surface: Airex Completed with: Eyes Open;   Time: 2 x 30 seconds bilat, pt more steady during 2nd set without using UE support for balance     With 6 stepping stones next to countertop: working on dynamic SLS, marching to each step, trying to go slow, performed down and back x5 reps, needing intermittent UE support as needed for balance  On trampoline: Alternating marching for dynamic SLS x20 reps each side  Jumping in and out into ABD and then feet more narrow BOS x10 reps, pt needing to use intermittent UE support for balance    PATIENT EDUCATION:  Education details: continue HEP, improvements in cervical rotation AROM since last assessed.  Person educated: Patient Education method: Explanation Education comprehension: verbalized understanding  HOME EXERCISE PROGRAM: Access Code: [email or web address] URL: [email or web address] Date: 12/29/2022 Prepared by: Sherlie Ban  Exercises - Seated Upper Trapezius Stretch  - 2 x daily - 7 x weekly - 3 sets - 30 hold - Gentle Levator Scapulae Stretch  - 2 x daily - 7 x weekly - 3 sets - 30 hold - Modified Cat Cow on Counter  - 2 x daily - 5 x weekly - 1-2 sets - 10 reps - Thoracic Rotation at Counter  - 2 x daily - 5 x weekly - 1  sets - 10 reps - Tandem Walking with Counter Support  - 1-2 x daily - 5 x weekly - 3 sets - Romberg Stance on Foam Pad with Head Rotation  - 2 x daily - 5 x weekly - 1 sets - 10 reps - also performed 10 reps head nods  - Seated Assisted Cervical Rotation with Towel  - 1 x daily - 7 x weekly - 1 sets - 3-5 reps - 30 sec hold   ASSESSMENT:  CLINICAL IMPRESSION: Pt demonstrating improvements in cervical rotation AROM today compared to when last assessed for STG assessment. Pt able to perform cervical rotation AROM for 53 degrees today with no pain (previously was 40 degrees and painful). Remainder of session focused on balance strategies with vision removed, compliant surfaces, SLS tasks, and head motions. Pt initially more unsteady during the first rep, but does significantly improve with subsequent reps, esp static tandem stance today. Will continue to progress towards LTGs.    OBJECTIVE IMPAIRMENTS: Abnormal gait, decreased balance, decreased knowledge of condition, decreased ROM, decreased strength, impaired perceived functional ability, impaired sensation, impaired UE functional use, improper body mechanics, postural dysfunction, and pain.   ACTIVITY LIMITATIONS: carrying, lifting, bending, standing, squatting, and stairs  PARTICIPATION LIMITATIONS: community activity  PERSONAL FACTORS: Age, Time since onset of injury/illness/exacerbation, and 1-2 comorbidities:   hyperlipidemia, insomnia, arthralgias, and diffuse large B cell lymphoma (2020), chemotherapy-induced neuropathy are also affecting patient's functional outcome.   REHAB POTENTIAL: Good  CLINICAL DECISION MAKING: Stable/uncomplicated  EVALUATION COMPLEXITY: Low   GOALS: Goals reviewed with patient? Yes  SHORT TERM GOALS: Target date: 01/07/2023   Pt will be independent with initial HEP for improved neck ROM, pain management, strength, balance, transfers and gait. Baseline:  Goal status: MET  2.  Pt will improve FGA to  18/30 for decreased fall risk  Baseline: 14/30 (4/26), 19/30 (5/22) Goal status: MET  3.  Pt will increase L cervical rotation by >/= 5 degrees with no increase in pain Baseline: 38 degrees and painful (4/26), 40 degrees and painful (5/22) Goal status: IN PROGRESS   LONG TERM GOALS: Target date: 01/28/2023   Pt will be independent with final HEP for improved neck ROM, pain management, strength, balance, transfers and gait. Baseline:  Goal status: INITIAL  2.  Pt will improve FGA to 22/30 for decreased fall risk  Baseline: 14/30 (4/26), 19/30 (5/21) Goal status: INITIAL  3.  Pt will increase L cervical rotation by >/= 10 degrees with no increase in pain Baseline: 38 degrees and painful (4/26), 40 degrees and painful (5/22) Goal status: INITIAL  4.  Pt will increase L and R lateral flexion by >/= 10 degrees Baseline:  Right lateral flexion 25  Left lateral flexion 17   Goal status: INITIAL  5.  Pt will improve score on NDI to </= 5/50 to demonstrate decreased disability level Baseline: 10/50 (4/26) Goal status: INITIAL     PLAN:  PT FREQUENCY: 2x/week  PT DURATION: 6 weeks  PLANNED INTERVENTIONS: Therapeutic exercises, Therapeutic activity, Neuromuscular re-education, Balance training, Gait training, Patient/Family education, Self Care, Joint mobilization, Stair training, Vestibular training, Canalith repositioning, Visual/preceptual remediation/compensation, DME instructions, Aquatic Therapy, Dry Needling, Electrical stimulation, Spinal manipulation, Spinal mobilization, Cryotherapy, Moist heat, Taping, Traction, Manual therapy, and Re-evaluation  PLAN FOR NEXT SESSION: check LTGs, potential D/C or updating goals to go through re-cerT?   how is HEP? Cervical AROM, work on balance tasks with head motions (horizontal) and narrow BOS and unlevel surfaces and EC   Edye Hainline N Lenwood Balsam, PT, DPT 01/24/2023, 12:00 PM

## 2023-01-26 ENCOUNTER — Ambulatory Visit: Payer: HMO | Admitting: Physical Therapy

## 2023-01-26 ENCOUNTER — Encounter: Payer: Self-pay | Admitting: Physical Therapy

## 2023-01-26 DIAGNOSIS — M6281 Muscle weakness (generalized): Secondary | ICD-10-CM

## 2023-01-26 DIAGNOSIS — R2681 Unsteadiness on feet: Secondary | ICD-10-CM

## 2023-01-26 DIAGNOSIS — M5412 Radiculopathy, cervical region: Secondary | ICD-10-CM

## 2023-01-26 DIAGNOSIS — R2689 Other abnormalities of gait and mobility: Secondary | ICD-10-CM

## 2023-01-26 NOTE — Therapy (Signed)
OUTPATIENT PHYSICAL THERAPY CERVICAL TREATMENT/DISCHARGE SUMMARY   Patient Name: Sandra Payne MRN: [id number] DOB:02/15/1941, 82 y.o., female Today's Date: 01/26/2023  END OF SESSION:  PT End of Session - 01/26/23 1104     Visit Number 8    Number of Visits 13   with eval   Date for PT Re-Evaluation 02/11/23    Authorization Type [employer]    PT Start Time 1103    PT Stop Time 1133   full time not used due to D/C visit   PT Time Calculation (min) 30 min    Equipment Utilized During Treatment Gait belt    Activity Tolerance Patient tolerated treatment well    Behavior During Therapy WFL for tasks assessed/performed                Past Medical History:  Diagnosis Date   Anemia    Arthralgia    Arthritis    Dyspnea    increased exertion   Elevated cholesterol    History of radiation therapy 08/20/19- 09/24/19   lower left neck 25 fractions of 1.8 Gy to total 45 Gy.    Insomnia    Lymphoma (HCC)    Neck mass    left   Past Surgical History:  Procedure Laterality Date   ABDOMINAL HYSTERECTOMY     partial   IR IMAGING GUIDED PORT INSERTION  03/28/2019   IR REMOVAL TUN ACCESS W/ PORT W/O FL MOD SED  11/20/2019   MASS BIOPSY Left 03/12/2019   Procedure: NECK MASS BIOPSY;  Surgeon: Serena Colonel, MD;  Location: [facility];  Service: ENT;  Laterality: Left;   TOTAL KNEE ARTHROPLASTY Right 01/06/2015   Procedure: RIGHT TOTAL KNEE ARTHROPLASTY;  Surgeon: Gean Birchwood, MD;  Location: [facility] OR;  Service: Orthopedics;  Laterality: Right;   Patient Active Problem List   Diagnosis Date Noted   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   Dyspnea on exertion 05/28/2020   Upper airway cough syndrome 05/28/2020   Port-A-Cath in place 09/05/2019   Anemia due to antineoplastic chemotherapy 05/16/2019   DLBCL (diffuse large B cell lymphoma) (HCC) 03/19/2019   Arthritis of knee 01/06/2015   Primary osteoarthritis of right knee 01/05/2015   Abnormality  of gait 12/03/2014   Benign paroxysmal positional vertigo 12/03/2014   Paresthesias 12/03/2014    PCP: Mila Palmer, MD  REFERRING PROVIDER: Glendale Chard, DO  REFERRING DIAG: [phone] (ICD-10-CM) - Cervical radiculopathy R20.2 (ICD-10-CM) - Paresthesia  THERAPY DIAG:  Muscle weakness (generalized)  Other abnormalities of gait and mobility  Unsteadiness on feet  Radiculopathy, cervical region  Rationale for Evaluation and Treatment: Rehabilitation  ONSET DATE: 12/13/2022  SUBJECTIVE:  SUBJECTIVE STATEMENT: Pt goes by "[name]"  No changes since she was last here, is ready to graduate. Woke up with a little bit of a crick in her neck, thinks it was the way she slept last night.   Hand dominance: Right  PERTINENT HISTORY:  hyperlipidemia, insomnia, arthralgias, and diffuse large B cell lymphoma (2020), chemotherapy-induced neuropathy PAIN:  Are you having pain? No pain today due to being on Tylenol.   PRECAUTIONS: None  WEIGHT BEARING RESTRICTIONS: No  FALLS:  Has patient fallen in last 6 months? No  LIVING ENVIRONMENT: Lives with: lives alone  OCCUPATION: retired  PLOF: Independent with gait and Independent with transfers  PATIENT GOALS: "I want to be able to not feel this so much"  NEXT MD VISIT: nothing scheduled   OBJECTIVE:   DIAGNOSTIC FINDINGS:   Cervical Spine MRI 12/01/22 IMPRESSION: 1. Multilevel cervical spondylosis, similar to previous CT from 02/19/2019. No acute osseous findings. 2. 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 3. Multilevel foraminal narrowing as described, greatest from C4-5 through C6-7. 4. Multilevel disc bulging  and facet hypertrophy in the visualized upper thoracic spine.   NCS/EMG of the arms 11/02/2022: Chronic multilevel radiculopathies affecting bilateral C5, C6, and C7 nerve root/segments, moderate.  These findings have progressed compared to prior study on 09/28/2018. Bilateral median neuropathy at or distal to the wrist, consistent with a clinical diagnosis of carpal tunnel syndrome.  Overall, these findings are very mild in degree electrically. 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   PATIENT SURVEYS:  NDI 10/50  COGNITION: Overall cognitive status: Within functional limits for tasks assessed  SENSATION: Neuropathy in B hands, unsure if related to her history of chemotherapy, carpal tunnel, or cervical radiculopathy  POSTURE: rounded shoulders and forward head  PALPATION: Tightness in suboccipitals, upper trap; no tenderness to palpation  CERVICAL ROM:   Active ROM A/PROM (deg) eval AROM (deg) 01/12/23 AROM (deg) 01/19/23  Flexion 30    Extension 35    Right lateral flexion 25 15 (painful)   Left lateral flexion 17 15 (painful)   Right rotation 60 60   Left rotation 38 (pain in neck) 40 (painful in neck) 45   (Blank rows = not tested)  UPPER EXTREMITY ROM:  Active ROM Right eval Left eval  Shoulder flexion Pain in shoulder at end range Pain in shoulder at end range  Shoulder extension    Shoulder abduction [facility]  Shoulder adduction    Shoulder extension    Shoulder internal rotation    Shoulder external rotation    Elbow flexion    Elbow extension    Wrist flexion    Wrist extension    Wrist ulnar deviation    Wrist radial deviation    Wrist pronation    Wrist supination     (Blank rows = not tested)  UPPER EXTREMITY MMT:  MMT Right eval Left eval  Shoulder flexion 4 4, onset of throbbing after testing  Shoulder extension    Shoulder abduction 5 5   (Blank rows = not tested)  CERVICAL SPECIAL TESTS:  Upper limb  tension test (ULTT): Negative Right UE: Median: feels like it might cramp up Radial: feels like it might cramp up Ulnar: pulling in triceps  Left UE: Median: feels pulling, no increase in N/T Radial: no symptoms Ulnar: pain in L shoulder    TODAY'S TREATMENT  01/26/23:                                                                                                                               Therapeutic Activity:  AROM Cervical spine:  Right lateral flexion: 35 Left lateral flexion: 30   NDI: 4/50    [facility] PT Assessment - 01/26/23 1110       Functional Gait  Assessment   Gait assessed  Yes    Gait Level Surface Walks 20 ft in less than 7 sec but greater than 5.5 sec, uses assistive device, slower speed, mild gait deviations, or deviates 6-10 in outside of the 12 in walkway width.   5.97 seconds   Change in Gait Speed Able to smoothly change walking speed without loss of balance or gait deviation. Deviate no more than 6 in outside of the 12 in walkway width.    Gait with Horizontal Head Turns Performs head turns smoothly with slight change in gait velocity (eg, minor disruption to smooth gait path), deviates 6-10 in outside 12 in walkway width, or uses an assistive device.    Gait with Vertical Head Turns Performs task with slight change in gait velocity (eg, minor disruption to smooth gait path), deviates 6 - 10 in outside 12 in walkway width or uses assistive device    Gait and Pivot Turn Pivot turns safely within 3 sec and stops quickly with no loss of balance.    Step Over Obstacle Is able to step over 2 stacked shoe boxes taped together (9 in total height) without changing gait speed. No evidence of imbalance.    Gait with Narrow Base of Support Ambulates 7-9 steps.    Gait with Eyes Closed Walks 20 ft, slow speed, abnormal gait pattern, evidence for imbalance, deviates 10-15 in outside 12 in walkway width. Requires more than 9 sec to ambulate 20 ft.   11 seconds   Ambulating  Backwards Walks 20 ft, uses assistive device, slower speed, mild gait deviations, deviates 6-10 in outside 12 in walkway width.    Steps Alternating feet, must use rail.    Total Score 22    FGA comment: 22/30, moderate fall risk               PATIENT EDUCATION:  Education details: Progress towards goals, reviewed HEP (see section below), discharge from PT  Person educated: Patient Education method: Explanation, Demonstration, and Handouts Education comprehension: verbalized understanding and returned demonstration  HOME EXERCISE PROGRAM: Access Code: [facility] URL: [email or web address] Date: 12/29/2022 Prepared by: Sherlie Ban  Exercises - Seated Upper Trapezius Stretch  - 2 x daily - 7 x weekly - 3 sets - 30 hold - Gentle Levator Scapulae Stretch  - 2 x daily - 7 x weekly - 3 sets - 30 hold - Modified Cat Cow on Counter  - 2 x daily - 5 x weekly - 1-2 sets - 10 reps - Thoracic Rotation at Counter  -  2 x daily - 5 x weekly - 1 sets - 10 reps - Tandem Walking with Counter Support  - 1-2 x daily - 5 x weekly - 3 sets - Romberg Stance on Foam Pad with Head Rotation  - 2 x daily - 5 x weekly - 1 sets - 10 reps - also performed 10 reps head nods  - Seated Assisted Cervical Rotation with Towel  - 1 x daily - 7 x weekly - 1 sets - 3-5 reps - 30 sec hold   PHYSICAL THERAPY DISCHARGE SUMMARY  Visits from Start of Care: 8  Current functional level related to goals / functional outcomes: See LTGs/Clinical Assessment Statement   Remaining deficits: Impaired high level balance    Education / Equipment: HEP   Patient agrees to discharge. Patient goals were met. Patient is being discharged due to meeting the stated rehab goals.  ASSESSMENT:  CLINICAL IMPRESSION: Today's skilled session focused on assessing LTGs. Pt has met all of her LTGs. Pt improved cervical AROM since eval and decr score on NDI, indicating decr disability with neck pain. Pt improved FGA to  a 22/30, at eval was a 14/30, decr pt's risk for falls. Reviewed pt's HEP, with pt having no questions at this time. Due to progress and meeting all LTGs, pt will be discharged at this time. Pt in agreement with plan.     OBJECTIVE IMPAIRMENTS: Abnormal gait, decreased balance, decreased knowledge of condition, decreased ROM, decreased strength, impaired perceived functional ability, impaired sensation, impaired UE functional use, improper body mechanics, postural dysfunction, and pain.   ACTIVITY LIMITATIONS: carrying, lifting, bending, standing, squatting, and stairs  PARTICIPATION LIMITATIONS: community activity  PERSONAL FACTORS: Age, Time since onset of injury/illness/exacerbation, and 1-2 comorbidities:   hyperlipidemia, insomnia, arthralgias, and diffuse large B cell lymphoma (2020), chemotherapy-induced neuropathy are also affecting patient's functional outcome.   REHAB POTENTIAL: Good  CLINICAL DECISION MAKING: Stable/uncomplicated  EVALUATION COMPLEXITY: Low   GOALS: Goals reviewed with patient? Yes  SHORT TERM GOALS: Target date: 01/07/2023   Pt will be independent with initial HEP for improved neck ROM, pain management, strength, balance, transfers and gait. Baseline:  Goal status: MET  2.  Pt will improve FGA to 18/30 for decreased fall risk  Baseline: 14/30 (4/26), 19/30 (5/22) Goal status: MET  3.  Pt will increase L cervical rotation by >/= 5 degrees with no increase in pain Baseline: 38 degrees and painful (4/26), 40 degrees and painful (5/22) Goal status: IN PROGRESS   LONG TERM GOALS: Target date: 01/28/2023   Pt will be independent with final HEP for improved neck ROM, pain management, strength, balance, transfers and gait. Baseline:  Goal status: MET  2.  Pt will improve FGA to 22/30 for decreased fall risk  Baseline: 14/30 (4/26), 19/30 (5/21); 22/30 on 01/26/23 Goal status: MET  3.  Pt will increase L cervical rotation by >/= 10 degrees with no  increase in pain Baseline: 38 degrees and painful (4/26), 40 degrees and painful (5/22)  53 degrees on 01/24/23 Goal status: MET  4.  Pt will increase L and R lateral flexion by >/= 10 degrees Baseline:  Right lateral flexion 25  Left lateral flexion 17   Right lateral flexion: 35 Left lateral flexion: 30  Goal status: MET  5.  Pt will improve score on NDI to </= 5/50 to demonstrate decreased disability level Baseline: 10/50 (4/26)  4/50 on 01/26/23 Goal status: MET     PLAN:  PT FREQUENCY: 2x/week  PT DURATION: 6 weeks  PLANNED INTERVENTIONS: Therapeutic exercises, Therapeutic activity, Neuromuscular re-education, Balance training, Gait training, Patient/Family education, Self Care, Joint mobilization, Stair training, Vestibular training, Canalith repositioning, Visual/preceptual remediation/compensation, DME instructions, Aquatic Therapy, Dry Needling, Electrical stimulation, Spinal manipulation, Spinal mobilization, Cryotherapy, Moist heat, Taping, Traction, Manual therapy, and Re-evaluation  PLAN FOR NEXT SESSION: D/C    Drake Leach, PT, DPT 01/26/2023, 11:45 AM

## 2023-01-31 ENCOUNTER — Ambulatory Visit: Payer: HMO | Admitting: Physical Therapy

## 2023-02-02 ENCOUNTER — Ambulatory Visit: Payer: HMO | Admitting: Physical Therapy

## 2023-02-07 ENCOUNTER — Ambulatory Visit: Payer: HMO | Admitting: Physical Therapy

## 2023-02-09 ENCOUNTER — Ambulatory Visit: Payer: HMO | Admitting: Physical Therapy

## 2023-02-17 DIAGNOSIS — R058 Other specified cough: Secondary | ICD-10-CM | POA: Diagnosis not present

## 2023-02-17 DIAGNOSIS — R32 Unspecified urinary incontinence: Secondary | ICD-10-CM | POA: Diagnosis not present

## 2023-02-17 DIAGNOSIS — R1031 Right lower quadrant pain: Secondary | ICD-10-CM | POA: Diagnosis not present

## 2023-03-14 ENCOUNTER — Encounter (HOSPITAL_COMMUNITY): Payer: Self-pay

## 2023-03-14 ENCOUNTER — Ambulatory Visit (HOSPITAL_COMMUNITY)
Admission: EM | Admit: 2023-03-14 | Discharge: 2023-03-14 | Disposition: A | Discharge status: Home / Self Care | Payer: HMO | Attending: Emergency Medicine | Admitting: Emergency Medicine

## 2023-03-14 DIAGNOSIS — J309 Allergic rhinitis, unspecified: Secondary | ICD-10-CM | POA: Diagnosis not present

## 2023-03-14 DIAGNOSIS — Z1152 Encounter for screening for COVID-19: Secondary | ICD-10-CM | POA: Diagnosis not present

## 2023-03-14 MED ORDER — AZELASTINE-FLUTICASONE 137-50 MCG/ACT NA SUSP: 1.0000 | Freq: Two times a day (BID) | NASAL | 2 refills | Status: AC

## 2023-03-14 NOTE — Discharge Instructions (Addendum)
You received a COVID-19 PCR test today.  The result of your COVID-19 test will be posted to your MyChart once it is complete, typically this takes 6 to 12 hours.      If your COVID-19 PCR test is positive, you will be contacted by phone.  Please discuss with the callback nurse whether or not you would benefit from antiviral therapy treatment for COVID-19.   Please read below to learn more about the medications, dosages and frequencies that I recommend to help alleviate your symptoms and to get you feeling better soon:   Zyrtec (cetirizine): This is an excellent second-generation antihistamine that helps to reduce respiratory inflammatory response to environmental allergens.  In some patients, this medication can cause daytime sleepiness so I recommend that you take 1 tablet daily at bedtime.     Dymista (fluticasone and azelastine): This is a combination nasal spray that contains both a nasal steroid and nasal antihistamine.  Please use 1 spray in each nare twice daily or every 12 hours.  This medication works best when it is used regularly, not "as needed".  You may find that it takes a few days for this to reach full effectiveness, please be patient with his onboarding process.  The most common side effect of this medication is nosebleeds.  Please discontinue use for 1 week if this occurs, then resume.   If symptoms have not meaningfully improved in the next 5 to 7 days, please return for repeat evaluation or follow-up with your regular provider.  If symptoms have worsened in the next 3 to 5 days, please return for repeat evaluation or follow-up with your regular provider.    Thank you for visiting urgent care today.  We appreciate the opportunity to participate in your care.

## 2023-03-14 NOTE — ED Provider Notes (Signed)
[facility]    CSN: [id number] Arrival date & time: 03/14/23  1940    HISTORY   Chief Complaint  Patient presents with   Cough   HPI Sandra Payne is a pleasant, 82 y.o. female who presents to urgent care today. Patient here today with c/o dry cough, frequent sneezing, and clear rhinorrhea all of which started this morning. Patient has tried taking Benzonatate and allergy pills but neither are helping.  Denies known sick contacts. Patient states she attended her high school class reunion 2 days ago.  Patient states she would like to be tested for COVID-19.  The history is provided by the patient.   Past Medical History:  Diagnosis Date   Anemia    Arthralgia    Arthritis    Dyspnea    increased exertion   Elevated cholesterol    History of radiation therapy 08/20/19- 09/24/19   lower left neck 25 fractions of 1.8 Gy to total 45 Gy.    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   Arthritis of knee 01/06/2015   Primary osteoarthritis of right knee 01/05/2015   Abnormality of gait 12/03/2014   Benign paroxysmal positional vertigo 12/03/2014   Paresthesias 12/03/2014   Past Surgical History:  Procedure Laterality Date   ABDOMINAL HYSTERECTOMY     partial   IR IMAGING GUIDED PORT INSERTION  03/28/2019   IR REMOVAL TUN ACCESS W/ PORT W/O FL MOD SED  11/20/2019   MASS BIOPSY Left 03/12/2019   Procedure: NECK MASS BIOPSY;  Surgeon: Serena Colonel, MD;  Location: [facility];  Service: ENT;  Laterality: Left;   TOTAL KNEE ARTHROPLASTY Right 01/06/2015   Procedure: RIGHT TOTAL KNEE ARTHROPLASTY;  Surgeon: Gean Birchwood, MD;   Location: [facility];  Service: Orthopedics;  Laterality: Right;   OB History   No obstetric history on file.    Home Medications    Prior to Admission medications   Medication Sig Start Date End Date Taking? Authorizing Provider  Azelastine-Fluticasone (DYMISTA) 137-50 MCG/ACT SUSP Place 1 spray into the nose every 12 (twelve) hours. 03/14/23 04/13/23 Yes Theadora Rama Scales, PA-C  ciclopirox (PENLAC) 8 % solution Apply topically at bedtime. Apply over nail and surrounding skin. Apply daily over previous coat. After seven (7) days, may remove with alcohol and continue cycle. Patient not taking: Reported on 11/02/2022 11/01/22   Vivi Barrack, DPM  acetaminophen (TYLENOL) 325 MG tablet Take 650 mg by mouth every 6 (six) hours as needed for mild pain or headache.     [provider]  albuterol (VENTOLIN HFA) 108 (90 Base) MCG/ACT inhaler Inhale 2 puffs into the lungs every 6 (six) hours as needed. Patient not taking: Reported on 12/17/2022 06/29/19   [provider]  calcium carbonate (TUMS - DOSED IN MG ELEMENTAL CALCIUM) 500 MG chewable tablet 2 tablets    [provider]  celecoxib (CELEBREX) 200 MG capsule Take 200 mg by mouth every other day.    [provider]  cetirizine (ZYRTEC) 10 MG tablet Take 10 mg by mouth daily. 03/31/20   [provider]  gabapentin (NEURONTIN) 100  MG capsule TAKE 2 CAPSULES BY MOUTH 2 TIMES DAILY. Patient not taking: Reported on 12/17/2022 02/15/22   Henreitta Leber, MD  Multiple Vitamin (MULTIVITAMIN) tablet Take 1 tablet by mouth daily.    [provider]  Omega 3 1000 MG CAPS     [provider]  omeprazole (PRILOSEC) 40 MG capsule Take 40 mg by mouth daily. 04/17/20   [provider]  rosuvastatin (CRESTOR) 10 MG tablet Take 10 mg by mouth daily. Take one tablet on Monday, Wednesday, and Friday    [provider]  tiZANidine (ZANAFLEX) 2 MG tablet Take 1 tablet (2 mg total) by mouth  at bedtime. OK to take an extra tablet if needed. 08/30/22   Patel, Roxana Hires K, DO  zolpidem (AMBIEN) 10 MG tablet Take 5-10 mg by mouth at bedtime as needed. 05/14/20   [provider]    Family History Family History  Problem Relation Age of Onset   Diabetes Mother    Hypertension Mother    Breast cancer Neg Hx    Social History Social History   Tobacco Use   Smoking status: Former    Current packs/day: 0.25    Average packs/day: 0.3 packs/day for 2.0 years (0.5 ttl pk-yrs)    Types: Cigarettes   Smokeless tobacco: Never   Tobacco comments:    she quit 30-40 years ago.   Vaping Use   Vaping status: Never Used  Substance Use Topics   Alcohol use: Yes    Alcohol/week: 0.0 standard drinks of alcohol    Comment: occasionally   Drug use: No   Allergies   Codeine, Tramadol hcl, and Sulfa antibiotics  Review of Systems Review of Systems Pertinent findings revealed after performing a 14 point review of systems has been noted in the history of present illness.  Physical Exam Vital Signs BP 127/81 (BP Location: Right Arm)   Pulse 99   Temp 99.2 F (37.3 C) (Oral)   Resp 16   Ht 4\' 11"  (1.499 m)   Wt 143 lb (64.9 kg)   SpO2 95%   BMI 28.88 kg/m   No data found.  Physical Exam  Visual Acuity Right Eye Distance:   Left Eye Distance:   Bilateral Distance:    Right Eye Near:   Left Eye Near:    Bilateral Near:     UC Couse / Diagnostics / Procedures:     Radiology No results found.  Procedures Procedures (including critical care time) EKG  Pending results:  Labs Reviewed  SARS CORONAVIRUS 2 (TAT 6-24 HRS) - Abnormal; Notable for the following components:      Result Value   SARS Coronavirus 2 POSITIVE (*)    All other components within normal limits    Medications Ordered in UC: Medications - No data to display  UC Diagnoses / Final Clinical Impressions(s)   I have reviewed the triage vital signs and the nursing notes.  Pertinent labs &  imaging results that were available during my care of the patient were reviewed by me and considered in my medical decision making (see chart for details).    Final diagnoses:  Allergic rhinitis, unspecified seasonality, unspecified trigger  Encounter for screening for COVID-19   COVID-19 testing performed at patient's request.  I believe the patient's symptoms are due to uncontrolled allergies.  Patient states she is supposed to be taking Flonase in addition to her allergy tablet daily which she is not.  Patient advised to begin taking her allergy  medicine daily and have provided her with a prescription for Dymista which may work a little bit better than Flonase.  Will notify patient of COVID-19 test results once received, if positive, patient would benefit from Paxlovid.  Please see discharge instructions below for details of plan of care as provided to patient. ED Prescriptions     Medication Sig Dispense Auth. Provider   Azelastine-Fluticasone (DYMISTA) 137-50 MCG/ACT SUSP Place 1 spray into the nose every 12 (twelve) hours. 23 g Theadora Rama Scales, PA-C      PDMP not reviewed this encounter.  Pending results:  Labs Reviewed  SARS CORONAVIRUS 2 (TAT 6-24 HRS) - Abnormal; Notable for the following components:      Result Value   SARS Coronavirus 2 POSITIVE (*)    All other components within normal limits    Discharge Instructions:   Discharge Instructions      You received a COVID-19 PCR test today.  The result of your COVID-19 test will be posted to your MyChart once it is complete, typically this takes 6 to 12 hours.      If your COVID-19 PCR test is positive, you will be contacted by phone.  Please discuss with the callback nurse whether or not you would benefit from antiviral therapy treatment for COVID-19.   Please read below to learn more about the medications, dosages and frequencies that I recommend to help alleviate your symptoms and to get you feeling better  soon:   Zyrtec (cetirizine): This is an excellent second-generation antihistamine that helps to reduce respiratory inflammatory response to environmental allergens.  In some patients, this medication can cause daytime sleepiness so I recommend that you take 1 tablet daily at bedtime.     Dymista (fluticasone and azelastine): This is a combination nasal spray that contains both a nasal steroid and nasal antihistamine.  Please use 1 spray in each nare twice daily or every 12 hours.  This medication works best when it is used regularly, not "as needed".  You may find that it takes a few days for this to reach full effectiveness, please be patient with his onboarding process.  The most common side effect of this medication is nosebleeds.  Please discontinue use for 1 week if this occurs, then resume.   If symptoms have not meaningfully improved in the next 5 to 7 days, please return for repeat evaluation or follow-up with your regular provider.  If symptoms have worsened in the next 3 to 5 days, please return for repeat evaluation or follow-up with your regular provider.    Thank you for visiting urgent care today.  We appreciate the opportunity to participate in your care.     Disposition Upon Discharge:  Condition: stable for discharge home  Patient presented with an acute illness with associated systemic symptoms and significant discomfort requiring urgent management. In my opinion, this is a condition that a prudent lay person (someone who possesses an average knowledge of health and medicine) may potentially expect to result in complications if not addressed urgently such as respiratory distress, impairment of bodily function or dysfunction of bodily organs.   Routine symptom specific, illness specific and/or disease specific instructions were discussed with the patient and/or caregiver at length.   As such, the patient has been evaluated and assessed, work-up was performed and treatment was  provided in alignment with urgent care protocols and evidence based medicine.  Patient/parent/caregiver has been advised that the patient may require follow up for further testing and treatment  if the symptoms continue in spite of treatment, as clinically indicated and appropriate.  Patient/parent/caregiver has been advised to return to the [facility] or PCP if no better; to PCP or the Emergency Department if new signs and symptoms develop, or if the current signs or symptoms continue to change or worsen for further workup, evaluation and treatment as clinically indicated and appropriate  The patient will follow up with their current PCP if and as advised. If the patient does not currently have a PCP we will assist them in obtaining one.   The patient may need specialty follow up if the symptoms continue, in spite of conservative treatment and management, for further workup, evaluation, consultation and treatment as clinically indicated and appropriate.  Patient/parent/caregiver verbalized understanding and agreement of plan as discussed.  All questions were addressed during visit.  Please see discharge instructions below for further details of plan.  This office note has been dictated using Teaching laboratory technician.  Unfortunately, this method of dictation can sometimes lead to typographical or grammatical errors.  I apologize for your inconvenience in advance if this occurs.  Please do not hesitate to reach out to me if clarification is needed.      Theadora Rama Scales, [place] 03/16/23 [phone]

## 2023-03-14 NOTE — ED Triage Notes (Signed)
Patient here today with c/o cough, sneeze, and nasal drainage that started this morning. Patient has tried taking Benzonatate and allergy pills but not helping. No sick contacts. No recent travel. This past weekend she was at a class reunion.

## 2023-03-15 ENCOUNTER — Telehealth (HOSPITAL_COMMUNITY): Payer: Self-pay | Admitting: Emergency Medicine

## 2023-03-15 LAB — SARS CORONAVIRUS 2 (TAT 6-24 HRS): SARS Coronavirus 2: POSITIVE — AB

## 2023-03-15 MED ORDER — MOLNUPIRAVIR EUA 200MG CAPSULE: 4.0000 | ORAL_CAPSULE | Freq: Two times a day (BID) | ORAL | 0 refills | Status: AC

## 2023-04-11 ENCOUNTER — Other Ambulatory Visit: Payer: Self-pay | Admitting: Family Medicine

## 2023-04-11 DIAGNOSIS — Z1231 Encounter for screening mammogram for malignant neoplasm of breast: Secondary | ICD-10-CM

## 2023-04-13 DIAGNOSIS — R062 Wheezing: Secondary | ICD-10-CM | POA: Diagnosis not present

## 2023-04-13 DIAGNOSIS — G9331 Postviral fatigue syndrome: Secondary | ICD-10-CM | POA: Diagnosis not present

## 2023-05-13 ENCOUNTER — Ambulatory Visit
Admission: RE | Admit: 2023-05-13 | Discharge: 2023-05-13 | Disposition: A | Discharge status: Home / Self Care | Payer: HMO | Source: Ambulatory Visit | Attending: Family Medicine | Admitting: Family Medicine

## 2023-05-13 DIAGNOSIS — Z1231 Encounter for screening mammogram for malignant neoplasm of breast: Secondary | ICD-10-CM

## 2023-05-31 DIAGNOSIS — M25562 Pain in left knee: Secondary | ICD-10-CM | POA: Diagnosis not present

## 2023-05-31 DIAGNOSIS — M7731 Calcaneal spur, right foot: Secondary | ICD-10-CM | POA: Diagnosis not present

## 2023-05-31 DIAGNOSIS — M79661 Pain in right lower leg: Secondary | ICD-10-CM | POA: Diagnosis not present

## 2023-05-31 DIAGNOSIS — Z96651 Presence of right artificial knee joint: Secondary | ICD-10-CM | POA: Diagnosis not present

## 2023-07-01 DIAGNOSIS — N390 Urinary tract infection, site not specified: Secondary | ICD-10-CM | POA: Diagnosis not present

## 2023-07-08 DIAGNOSIS — N3000 Acute cystitis without hematuria: Secondary | ICD-10-CM | POA: Diagnosis not present

## 2023-07-08 DIAGNOSIS — Z23 Encounter for immunization: Secondary | ICD-10-CM | POA: Diagnosis not present

## 2023-07-08 DIAGNOSIS — T7840XA Allergy, unspecified, initial encounter: Secondary | ICD-10-CM | POA: Diagnosis not present

## 2023-08-11 ENCOUNTER — Ambulatory Visit (INDEPENDENT_AMBULATORY_CARE_PROVIDER_SITE_OTHER): Payer: HMO

## 2023-08-11 ENCOUNTER — Ambulatory Visit (HOSPITAL_COMMUNITY)
Admission: EM | Admit: 2023-08-11 | Discharge: 2023-08-11 | Disposition: A | Discharge status: Home / Self Care | Payer: HMO | Attending: Family Medicine | Admitting: Family Medicine

## 2023-08-11 ENCOUNTER — Encounter (HOSPITAL_COMMUNITY): Payer: Self-pay

## 2023-08-11 DIAGNOSIS — M542 Cervicalgia: Secondary | ICD-10-CM

## 2023-08-11 DIAGNOSIS — M50222 Other cervical disc displacement at C5-C6 level: Secondary | ICD-10-CM | POA: Diagnosis not present

## 2023-08-11 DIAGNOSIS — M4802 Spinal stenosis, cervical region: Secondary | ICD-10-CM | POA: Diagnosis not present

## 2023-08-11 DIAGNOSIS — M50323 Other cervical disc degeneration at C6-C7 level: Secondary | ICD-10-CM | POA: Diagnosis not present

## 2023-08-11 DIAGNOSIS — M5031 Other cervical disc degeneration,  high cervical region: Secondary | ICD-10-CM | POA: Diagnosis not present

## 2023-08-11 MED ORDER — KETOROLAC TROMETHAMINE 30 MG/ML IJ SOLN
30.0000 mg | Freq: Once | INTRAMUSCULAR | Status: AC
  Administered 2023-08-11: 30 mg via INTRAMUSCULAR

## 2023-08-11 MED ORDER — METHOCARBAMOL 500 MG PO TABS: 500.0000 mg | ORAL_TABLET | Freq: Two times a day (BID) | ORAL | 0 refills | Status: AC

## 2023-08-11 MED ORDER — KETOROLAC TROMETHAMINE 30 MG/ML IJ SOLN
INTRAMUSCULAR | Status: AC
  Filled 2023-08-11: qty 1

## 2023-08-11 NOTE — ED Provider Notes (Signed)
[facility]    CSN: [id number] Arrival date & time: 08/11/23  4098      History   Chief Complaint Chief Complaint  Patient presents with   Neck Pain    HPI Sandra Payne is a 82 y.o. female.    Neck Pain  Patient is here for neck pain x 4 days.  It is not really in the middle, but mostly on the sides.  Pain with any movement.  No injury, but just woke with it.  Using heat, icy-hot, tylenol, not helping.  She has used tizandine, but did not really help much.  She has used flexeril in the past, but not sure if really helpful.  She did have an MRI in 11/2022 of her neck.  Known disc disease.  Reports last labs done earlier this year, normal kidney function.        Past Medical History:  Diagnosis Date   Anemia    Arthralgia    Arthritis    Dyspnea    increased exertion   Elevated cholesterol    History of radiation therapy 08/20/19- 09/24/19   lower left neck 25 fractions of 1.8 Gy to total 45 Gy.    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;  Service: ENT;  Laterality: Left;   TOTAL KNEE ARTHROPLASTY Right  01/06/2015   Procedure: RIGHT TOTAL KNEE ARTHROPLASTY;  Surgeon: Gean Birchwood, MD;  Location: [facility];  Service: Orthopedics;  Laterality: Right;    OB History   No obstetric history on file.      Home Medications    Prior to Admission medications   Medication Sig Start Date End Date Taking? Authorizing Provider  ciclopirox (PENLAC) 8 % solution Apply topically at bedtime. Apply over nail and surrounding skin. Apply daily over previous coat. After seven (7) days, may remove with alcohol and continue cycle. Patient not taking: Reported on 11/02/2022 11/01/22   Vivi Barrack, DPM  acetaminophen (TYLENOL) 325 MG tablet Take 650 mg by mouth every 6 (six) hours as needed for mild pain or headache.     [provider]  albuterol (VENTOLIN HFA) 108 (90 Base) MCG/ACT inhaler Inhale 2 puffs into the lungs every 6 (six) hours as needed. Patient not taking: Reported on 12/17/2022 06/29/19   [provider]  Azelastine-Fluticasone (DYMISTA) 137-50 MCG/ACT SUSP Place 1 spray into the nose every 12 (twelve) hours. 03/14/23 04/13/23  Theadora Rama Scales, PA-C  calcium carbonate (TUMS - DOSED IN MG ELEMENTAL CALCIUM) 500 MG chewable tablet 2 tablets    [provider]  celecoxib (CELEBREX) 200 MG capsule Take 200 mg by mouth every other day.    [provider]  cetirizine (ZYRTEC) 10 MG tablet Take 10 mg by mouth daily. 03/31/20   [provider]  gabapentin (NEURONTIN) 100 MG capsule TAKE 2 CAPSULES BY MOUTH 2 TIMES DAILY. Patient not taking: Reported on 12/17/2022 02/15/22   Henreitta Leber, MD  Multiple Vitamin (MULTIVITAMIN) tablet Take 1 tablet by mouth daily.    [provider]  Omega 3 1000 MG CAPS     [provider]  omeprazole (PRILOSEC) 40 MG capsule Take 40 mg by mouth daily. 04/17/20   [provider]  rosuvastatin (CRESTOR) 10 MG tablet Take 10 mg by mouth daily. Take one tablet on Monday, Wednesday, and Friday    [provider]  tiZANidine (ZANAFLEX) 2 MG tablet Take 1 tablet (2 mg total) by mouth at bedtime. OK to take an extra tablet if needed. 08/30/22   Patel, Roxana Hires K, DO  zolpidem (AMBIEN) 10 MG tablet Take 5-10 mg by mouth at bedtime as needed. 05/14/20   [provider]    Family History Family History  Problem Relation Age of Onset   Diabetes Mother    Hypertension Mother    Breast cancer Neg Hx     Social History Social History   Tobacco Use   Smoking status: Former    Current packs/day: 0.25    Average packs/day: 0.3 packs/day for 2.0 years (0.5 ttl pk-yrs)    Types: Cigarettes   Smokeless tobacco: Never   Tobacco comments:    she quit 30-40 years ago.   Vaping Use   Vaping status: Never Used  Substance Use Topics   Alcohol use: Yes    Alcohol/week: 0.0 standard drinks of alcohol    Comment: occasionally   Drug use: No     Allergies   Codeine, Tramadol hcl, and Sulfa antibiotics   Review of Systems Review of Systems  Constitutional: Negative.   HENT: Negative.    Respiratory: Negative.    Cardiovascular: Negative.   Gastrointestinal: Negative.   Genitourinary: Negative.   Musculoskeletal:  Positive for neck pain.     Physical Exam Triage Vital Signs ED Triage Vitals  Encounter Vitals Group     BP 08/11/23 0927 138/78     Systolic BP Percentile --      Diastolic BP Percentile --      Pulse Rate 08/11/23 0927 88     Resp 08/11/23 0927 18     Temp 08/11/23 0927 98.2 F (36.8 C)     Temp Source 08/11/23 0927 Oral     SpO2 08/11/23 0927 96 %     Weight 08/11/23 0930 138 lb (62.6 kg)     Height 08/11/23 0926 4\' 11"  (1.499 m)     Head Circumference --      Peak Flow --      Pain Score 08/11/23 0925 10     Pain Loc --      Pain Education --      Exclude from Growth Chart --    No data found.  Updated Vital Signs BP 138/78 (BP Location: Left Arm)   Pulse 88   Temp 98.2 F (36.8 C) (Oral)   Resp 18   Ht 4\' 11"  (1.499 m)   Wt 62.6 kg    SpO2 96%   BMI 27.87 kg/m   Visual Acuity Right Eye Distance:   Left Eye Distance:   Bilateral Distance:  Right Eye Near:   Left Eye Near:    Bilateral Near:     Physical Exam Constitutional:      Appearance: Normal appearance.  Cardiovascular:     Rate and Rhythm: Normal rate and regular rhythm.  Pulmonary:     Effort: Pulmonary effort is normal.     Breath sounds: Normal breath sounds.  Musculoskeletal:     Comments: +TTP to the cervical spine and paraspinals. Pain with movement of the neck in any direction;  she does have full ROM without limitation  Neurological:     General: No focal deficit present.     Mental Status: She is alert.  Psychiatric:        Mood and Affect: Mood normal.      UC Treatments / Results  Labs (all labs ordered are listed, but only abnormal results are displayed) Labs Reviewed - No data to display  EKG   Radiology No results found.  Procedures Procedures (including critical care time)  Medications Ordered in UC Medications  ketorolac (TORADOL) 30 MG/ML injection 30 mg (has no administration in time range)    Initial Impression / Assessment and Plan / UC Course  I have reviewed the triage vital signs and the nursing notes.  Pertinent labs & imaging results that were available during my care of the patient were reviewed by me and considered in my medical decision making (see chart for details).   Final Clinical Impressions(s) / UC Diagnoses   Final diagnoses:  Neck pain     Discharge Instructions      You were seen today for neck pain.  Your xray shows arthritis, but nothing more worrisome at this time.  If the radiologist reads this differently we will notify you.  I have given you a shot of toradol today for pain, and sent out a muscle relaxer to use.  Please take this when home and not driving as it will make you sleepy.  Do not combine this with your other muscle relaxer.  Please use a heating pad as well.  Follow  up with your primary care provider if not improving.     ED Prescriptions     Medication Sig Dispense Auth. Provider   methocarbamol (ROBAXIN) 500 MG tablet Take 1 tablet (500 mg total) by mouth 2 (two) times daily. 20 tablet Jannifer Franklin, MD      PDMP not reviewed this encounter.   Jannifer Franklin, MD 08/11/23 1014

## 2023-08-11 NOTE — ED Triage Notes (Signed)
Pt presents with neck pain x 1 week. Pt states she has been applying heat, Icy-hot, and taking Tylenol with no relief. Pt currently rates her neck pain a 10/10.

## 2023-08-11 NOTE — Discharge Instructions (Addendum)
You were seen today for neck pain.  Your xray shows arthritis, but nothing more worrisome at this time.  If the radiologist reads this differently we will notify you.  I have given you a shot of toradol today for pain, and sent out a muscle relaxer to use.  Please take this when home and not driving as it will make you sleepy.  Do not combine this with your other muscle relaxer.  Please use a heating pad as well.  Follow up with your primary care provider if not improving.

## 2023-08-12 DIAGNOSIS — M542 Cervicalgia: Secondary | ICD-10-CM | POA: Diagnosis not present

## 2023-08-22 ENCOUNTER — Encounter: Payer: Self-pay | Admitting: Hematology

## 2023-08-22 ENCOUNTER — Other Ambulatory Visit: Payer: Self-pay | Admitting: Family Medicine

## 2023-08-22 DIAGNOSIS — M542 Cervicalgia: Secondary | ICD-10-CM

## 2023-08-23 ENCOUNTER — Inpatient Hospital Stay
Admission: RE | Admit: 2023-08-23 | Discharge: 2023-08-23 | Discharge status: Home / Self Care | Payer: HMO | Source: Ambulatory Visit | Attending: Family Medicine | Admitting: Family Medicine

## 2023-08-23 DIAGNOSIS — M542 Cervicalgia: Secondary | ICD-10-CM | POA: Diagnosis not present

## 2023-08-23 MED ORDER — IOPAMIDOL (ISOVUE-300) INJECTION 61%
500.0000 mL | Freq: Once | INTRAVENOUS | Status: AC | PRN
  Administered 2023-08-23: 75 mL via INTRAVENOUS

## 2023-10-05 ENCOUNTER — Other Ambulatory Visit: Payer: Self-pay | Admitting: Neurology

## 2024-01-02 ENCOUNTER — Other Ambulatory Visit: Payer: Self-pay | Admitting: Neurology

## 2024-01-03 DIAGNOSIS — M5412 Radiculopathy, cervical region: Secondary | ICD-10-CM | POA: Diagnosis not present

## 2024-01-03 DIAGNOSIS — J383 Other diseases of vocal cords: Secondary | ICD-10-CM | POA: Diagnosis not present

## 2024-01-03 DIAGNOSIS — G620 Drug-induced polyneuropathy: Secondary | ICD-10-CM | POA: Diagnosis not present

## 2024-01-04 NOTE — Telephone Encounter (Signed)
 Message sent to front staff to contact patient for follow up.

## 2024-01-06 DIAGNOSIS — R946 Abnormal results of thyroid function studies: Secondary | ICD-10-CM | POA: Diagnosis not present

## 2024-01-06 DIAGNOSIS — I700 Atherosclerosis of aorta: Secondary | ICD-10-CM | POA: Diagnosis not present

## 2024-01-06 DIAGNOSIS — E785 Hyperlipidemia, unspecified: Secondary | ICD-10-CM | POA: Diagnosis not present

## 2024-01-06 DIAGNOSIS — D509 Iron deficiency anemia, unspecified: Secondary | ICD-10-CM | POA: Diagnosis not present

## 2024-01-06 DIAGNOSIS — C8330 Diffuse large B-cell lymphoma, unspecified site: Secondary | ICD-10-CM | POA: Diagnosis not present

## 2024-01-06 DIAGNOSIS — E559 Vitamin D deficiency, unspecified: Secondary | ICD-10-CM | POA: Diagnosis not present

## 2024-01-06 DIAGNOSIS — Z79899 Other long term (current) drug therapy: Secondary | ICD-10-CM | POA: Diagnosis not present

## 2024-01-06 DIAGNOSIS — Z0000 Encounter for general adult medical examination without abnormal findings: Secondary | ICD-10-CM | POA: Diagnosis not present

## 2024-01-06 DIAGNOSIS — G720 Drug-induced myopathy: Secondary | ICD-10-CM | POA: Diagnosis not present

## 2024-01-06 DIAGNOSIS — E538 Deficiency of other specified B group vitamins: Secondary | ICD-10-CM | POA: Diagnosis not present

## 2024-01-09 ENCOUNTER — Other Ambulatory Visit: Payer: Self-pay | Admitting: Family Medicine

## 2024-01-09 DIAGNOSIS — Z1231 Encounter for screening mammogram for malignant neoplasm of breast: Secondary | ICD-10-CM

## 2024-01-19 DIAGNOSIS — R946 Abnormal results of thyroid function studies: Secondary | ICD-10-CM | POA: Diagnosis not present

## 2024-01-23 DIAGNOSIS — R399 Unspecified symptoms and signs involving the genitourinary system: Secondary | ICD-10-CM | POA: Diagnosis not present

## 2024-02-01 DIAGNOSIS — K6289 Other specified diseases of anus and rectum: Secondary | ICD-10-CM | POA: Diagnosis not present

## 2024-02-20 DIAGNOSIS — E785 Hyperlipidemia, unspecified: Secondary | ICD-10-CM | POA: Diagnosis not present

## 2024-02-20 DIAGNOSIS — F330 Major depressive disorder, recurrent, mild: Secondary | ICD-10-CM | POA: Diagnosis not present

## 2024-03-22 DIAGNOSIS — E785 Hyperlipidemia, unspecified: Secondary | ICD-10-CM | POA: Diagnosis not present

## 2024-03-22 DIAGNOSIS — F330 Major depressive disorder, recurrent, mild: Secondary | ICD-10-CM | POA: Diagnosis not present

## 2024-03-30 DIAGNOSIS — M542 Cervicalgia: Secondary | ICD-10-CM | POA: Diagnosis not present

## 2024-03-30 DIAGNOSIS — G9589 Other specified diseases of spinal cord: Secondary | ICD-10-CM | POA: Diagnosis not present

## 2024-03-30 DIAGNOSIS — M47812 Spondylosis without myelopathy or radiculopathy, cervical region: Secondary | ICD-10-CM | POA: Diagnosis not present

## 2024-04-22 DIAGNOSIS — F330 Major depressive disorder, recurrent, mild: Secondary | ICD-10-CM | POA: Diagnosis not present

## 2024-04-22 DIAGNOSIS — E785 Hyperlipidemia, unspecified: Secondary | ICD-10-CM | POA: Diagnosis not present

## 2024-04-24 DIAGNOSIS — M47812 Spondylosis without myelopathy or radiculopathy, cervical region: Secondary | ICD-10-CM | POA: Diagnosis not present

## 2024-04-26 DIAGNOSIS — Z23 Encounter for immunization: Secondary | ICD-10-CM | POA: Diagnosis not present

## 2024-04-26 DIAGNOSIS — E785 Hyperlipidemia, unspecified: Secondary | ICD-10-CM | POA: Diagnosis not present

## 2024-04-26 DIAGNOSIS — M25552 Pain in left hip: Secondary | ICD-10-CM | POA: Diagnosis not present

## 2024-04-26 DIAGNOSIS — M1990 Unspecified osteoarthritis, unspecified site: Secondary | ICD-10-CM | POA: Diagnosis not present

## 2024-04-26 DIAGNOSIS — G9589 Other specified diseases of spinal cord: Secondary | ICD-10-CM | POA: Diagnosis not present

## 2024-04-30 DIAGNOSIS — M47816 Spondylosis without myelopathy or radiculopathy, lumbar region: Secondary | ICD-10-CM | POA: Diagnosis not present

## 2024-04-30 DIAGNOSIS — I878 Other specified disorders of veins: Secondary | ICD-10-CM | POA: Diagnosis not present

## 2024-04-30 DIAGNOSIS — M48061 Spinal stenosis, lumbar region without neurogenic claudication: Secondary | ICD-10-CM | POA: Diagnosis not present

## 2024-04-30 DIAGNOSIS — M545 Low back pain, unspecified: Secondary | ICD-10-CM | POA: Diagnosis not present

## 2024-04-30 DIAGNOSIS — M1990 Unspecified osteoarthritis, unspecified site: Secondary | ICD-10-CM | POA: Diagnosis not present

## 2024-04-30 DIAGNOSIS — M5136 Other intervertebral disc degeneration, lumbar region with discogenic back pain only: Secondary | ICD-10-CM | POA: Diagnosis not present

## 2024-04-30 DIAGNOSIS — M4319 Spondylolisthesis, multiple sites in spine: Secondary | ICD-10-CM | POA: Diagnosis not present

## 2024-05-01 DIAGNOSIS — G9589 Other specified diseases of spinal cord: Secondary | ICD-10-CM | POA: Diagnosis not present

## 2024-05-01 DIAGNOSIS — M47812 Spondylosis without myelopathy or radiculopathy, cervical region: Secondary | ICD-10-CM | POA: Diagnosis not present

## 2024-05-01 DIAGNOSIS — M542 Cervicalgia: Secondary | ICD-10-CM | POA: Diagnosis not present

## 2024-05-14 ENCOUNTER — Ambulatory Visit
Admission: RE | Admit: 2024-05-14 | Discharge: 2024-05-14 | Disposition: A | Discharge status: Home / Self Care | Source: Ambulatory Visit | Attending: Family Medicine | Admitting: Family Medicine

## 2024-05-14 ENCOUNTER — Encounter: Payer: Self-pay | Admitting: Hematology

## 2024-05-14 DIAGNOSIS — Z1231 Encounter for screening mammogram for malignant neoplasm of breast: Secondary | ICD-10-CM | POA: Diagnosis not present

## 2024-05-18 DIAGNOSIS — M549 Dorsalgia, unspecified: Secondary | ICD-10-CM | POA: Diagnosis not present

## 2024-05-22 DIAGNOSIS — E785 Hyperlipidemia, unspecified: Secondary | ICD-10-CM | POA: Diagnosis not present

## 2024-05-22 DIAGNOSIS — F330 Major depressive disorder, recurrent, mild: Secondary | ICD-10-CM | POA: Diagnosis not present

## 2024-06-14 ENCOUNTER — Other Ambulatory Visit: Payer: Self-pay | Admitting: Family Medicine

## 2024-06-14 DIAGNOSIS — R1031 Right lower quadrant pain: Secondary | ICD-10-CM

## 2024-06-14 DIAGNOSIS — Z23 Encounter for immunization: Secondary | ICD-10-CM | POA: Diagnosis not present

## 2024-06-14 DIAGNOSIS — R1030 Lower abdominal pain, unspecified: Secondary | ICD-10-CM | POA: Diagnosis not present

## 2024-06-15 ENCOUNTER — Encounter: Payer: Self-pay | Admitting: Family Medicine

## 2024-06-15 ENCOUNTER — Ambulatory Visit
Admission: RE | Admit: 2024-06-15 | Discharge: 2024-06-15 | Disposition: A | Discharge status: Home / Self Care | Source: Ambulatory Visit | Attending: Family Medicine | Admitting: Family Medicine

## 2024-06-15 DIAGNOSIS — R1031 Right lower quadrant pain: Secondary | ICD-10-CM

## 2024-06-15 MED ORDER — IOPAMIDOL (ISOVUE-300) INJECTION 61%
100.0000 mL | Freq: Once | INTRAVENOUS | Status: AC | PRN
  Administered 2024-06-15: 100 mL via INTRAVENOUS

## 2024-06-16 ENCOUNTER — Other Ambulatory Visit: Payer: Self-pay | Admitting: Neurology

## 2024-06-22 DIAGNOSIS — E785 Hyperlipidemia, unspecified: Secondary | ICD-10-CM | POA: Diagnosis not present

## 2024-06-22 DIAGNOSIS — F330 Major depressive disorder, recurrent, mild: Secondary | ICD-10-CM | POA: Diagnosis not present

## 2024-07-22 DIAGNOSIS — F330 Major depressive disorder, recurrent, mild: Secondary | ICD-10-CM | POA: Diagnosis not present

## 2024-07-22 DIAGNOSIS — E785 Hyperlipidemia, unspecified: Secondary | ICD-10-CM | POA: Diagnosis not present

## 2024-07-23 ENCOUNTER — Encounter: Payer: Self-pay | Admitting: Hematology

## 2024-07-23 ENCOUNTER — Ambulatory Visit (INDEPENDENT_AMBULATORY_CARE_PROVIDER_SITE_OTHER): Admitting: Podiatry

## 2024-07-23 DIAGNOSIS — L600 Ingrowing nail: Secondary | ICD-10-CM

## 2024-07-23 DIAGNOSIS — M21621 Bunionette of right foot: Secondary | ICD-10-CM

## 2024-07-23 DIAGNOSIS — M21622 Bunionette of left foot: Secondary | ICD-10-CM | POA: Diagnosis not present

## 2024-07-23 DIAGNOSIS — M2042 Other hammer toe(s) (acquired), left foot: Secondary | ICD-10-CM

## 2024-07-23 DIAGNOSIS — M2041 Other hammer toe(s) (acquired), right foot: Secondary | ICD-10-CM

## 2024-07-23 NOTE — Progress Notes (Signed)
 Subjective:   Patient ID: Sandra Payne, female   DOB: 83 y.o.   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  Patient states that this is something that is been going on for a fairly long time   ROS      Objective:  Physical Exam  Neurovascular status intact structural deformity hallux second toe with distal deviation of the second toe creating pressure against the nailbeds and around the hallux.  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  I educated her on the relationship of digital deformity bunion deformity to nail disease but I do not recommend current treatment beyond trimming techniques which I reviewed with her today.  I do not think this will be a long-term problem I did discuss oral medicines topical medicines and currently do not recommend and we discussed bunion hammertoe correction but do not recommend at current time
